# Patient Record
Sex: Female | Born: 1940 | Race: White | Hispanic: No | Smoking: Current every day smoker
Health system: Southern US, Community
[De-identification: ages and names within clinical notes are randomized; demographics above are authoritative.]

## PROBLEM LIST (undated history)

## (undated) DIAGNOSIS — I10 Essential (primary) hypertension: Secondary | ICD-10-CM

## (undated) DIAGNOSIS — I6529 Occlusion and stenosis of unspecified carotid artery: Secondary | ICD-10-CM

## (undated) DIAGNOSIS — I4891 Unspecified atrial fibrillation: Secondary | ICD-10-CM

## (undated) DIAGNOSIS — E785 Hyperlipidemia, unspecified: Secondary | ICD-10-CM

## (undated) DIAGNOSIS — I639 Cerebral infarction, unspecified: Secondary | ICD-10-CM

## (undated) HISTORY — DX: Cerebral infarction, unspecified: I63.9

## (undated) HISTORY — DX: Occlusion and stenosis of unspecified carotid artery: I65.29

## (undated) HISTORY — PX: CATARACT EXTRACTION, BILATERAL: SHX1313

## (undated) HISTORY — DX: Hyperlipidemia, unspecified: E78.5

## (undated) HISTORY — PX: BACK SURGERY: SHX140

## (undated) HISTORY — DX: Unspecified atrial fibrillation: I48.91

---
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
## 1981-07-09 HISTORY — PX: OTHER SURGICAL HISTORY: SHX169

## 2010-09-26 ENCOUNTER — Other Ambulatory Visit: Payer: Self-pay | Admitting: Family Medicine

## 2010-09-26 DIAGNOSIS — E785 Hyperlipidemia, unspecified: Secondary | ICD-10-CM

## 2010-09-26 DIAGNOSIS — R0989 Other specified symptoms and signs involving the circulatory and respiratory systems: Secondary | ICD-10-CM

## 2010-10-02 ENCOUNTER — Ambulatory Visit
Admission: RE | Admit: 2010-10-02 | Discharge: 2010-10-02 | Disposition: A | Discharge status: Home / Self Care | Payer: Medicare Other | Source: Ambulatory Visit | Attending: Family Medicine | Admitting: Family Medicine

## 2010-10-02 DIAGNOSIS — R0989 Other specified symptoms and signs involving the circulatory and respiratory systems: Secondary | ICD-10-CM

## 2010-10-02 DIAGNOSIS — E785 Hyperlipidemia, unspecified: Secondary | ICD-10-CM

## 2010-10-02 IMAGING — US US CAROTID DUPLEX BILAT
1 series · 13 of 24 positions shown · non-contrast
Comparison: None.

CLINICAL DATA: 69-year with carotid bruit and hyperlipidemia.

BILATERAL CAROTID DUPLEX ULTRASOUND
TECHNIQUE: Gray scale imaging, color Doppler and duplex ultrasound
was performed of bilateral carotid and vertebral arteries in the
neck.

[Series 1: us carotid duplex bilat · 0.08mm/px · 13 of 60 slices shown]
[im 1/60]
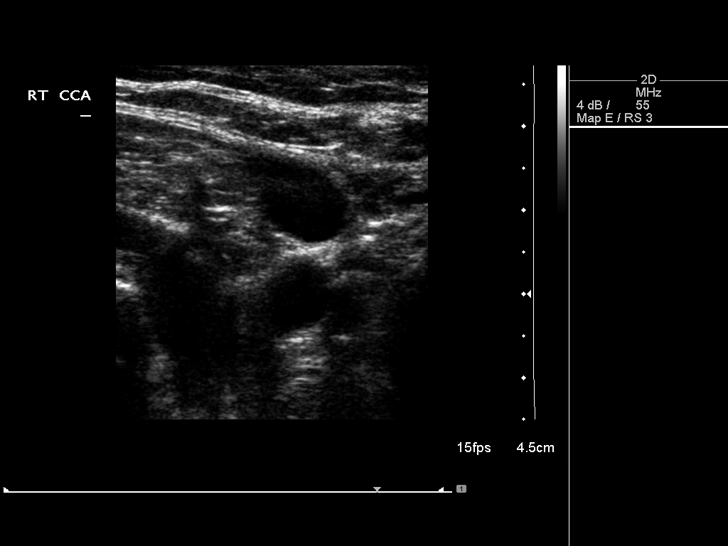
[im 6/60]
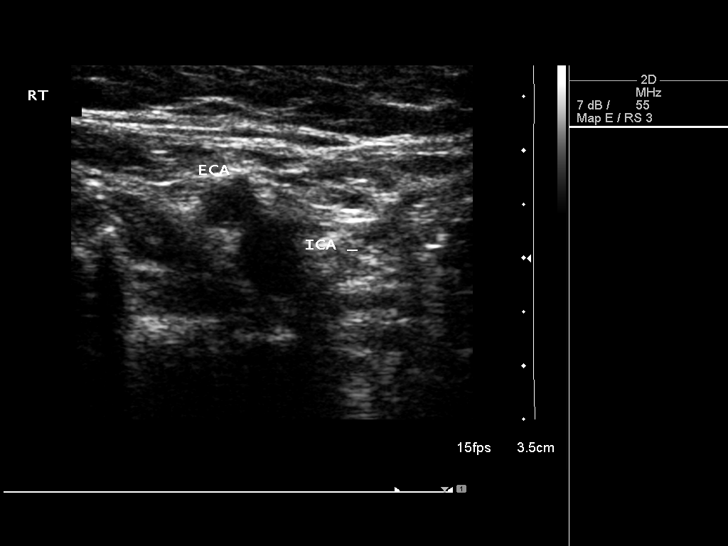
[im 11/60]
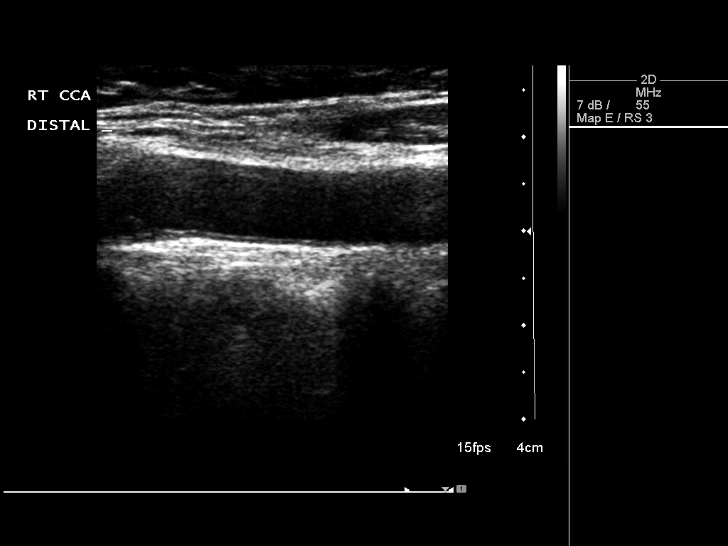
[im 16/60]
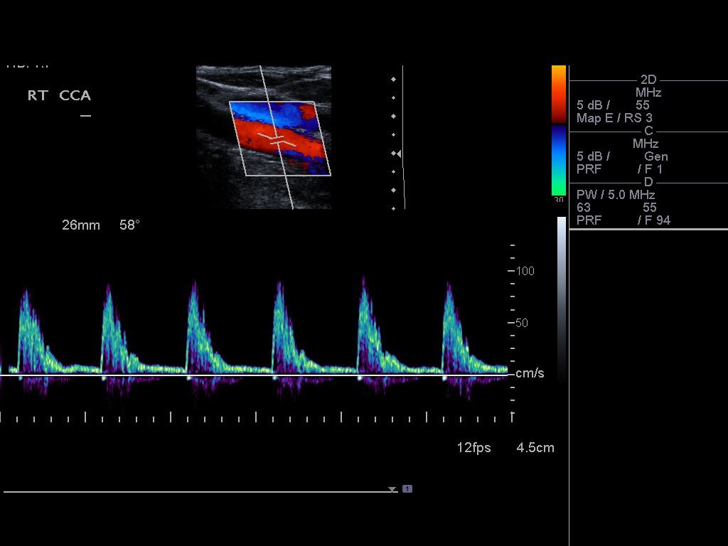
[im 21/60]
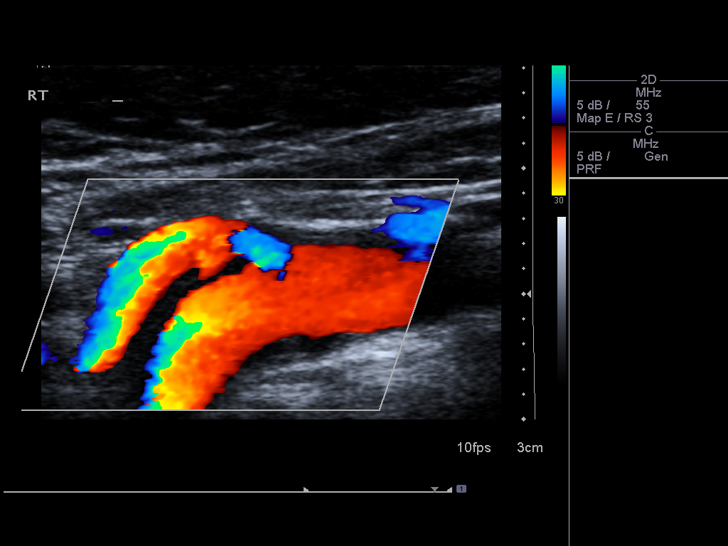
[im 26/60]
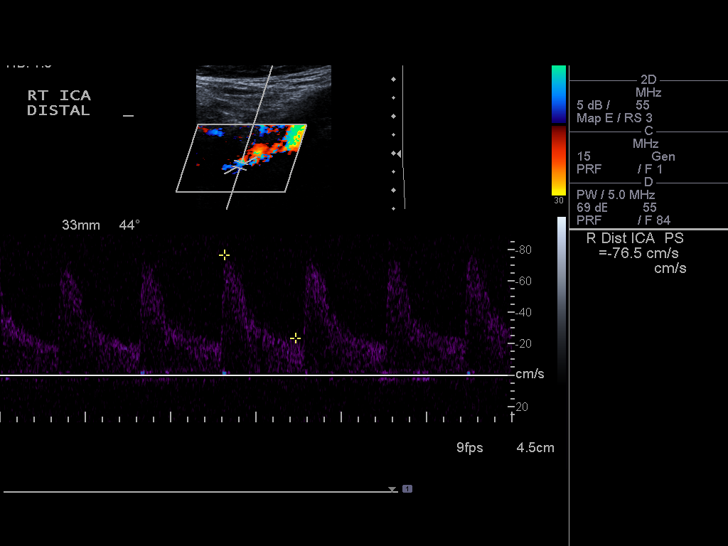
[im 31/60]
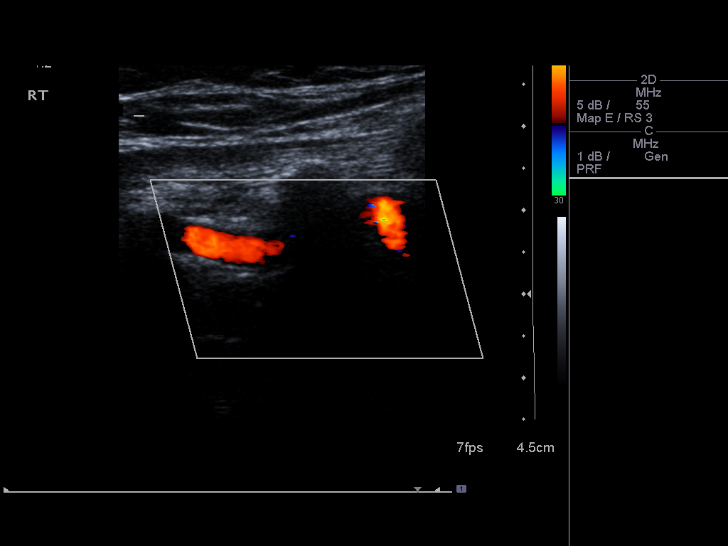
[im 34/60]
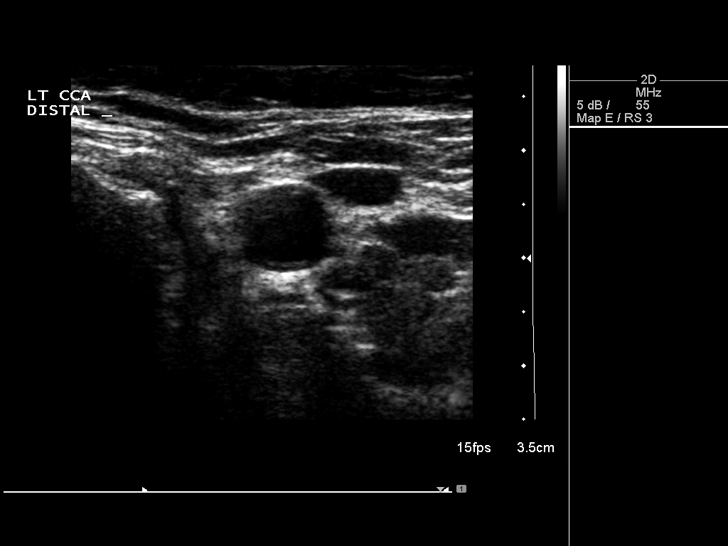
[im 39/60]
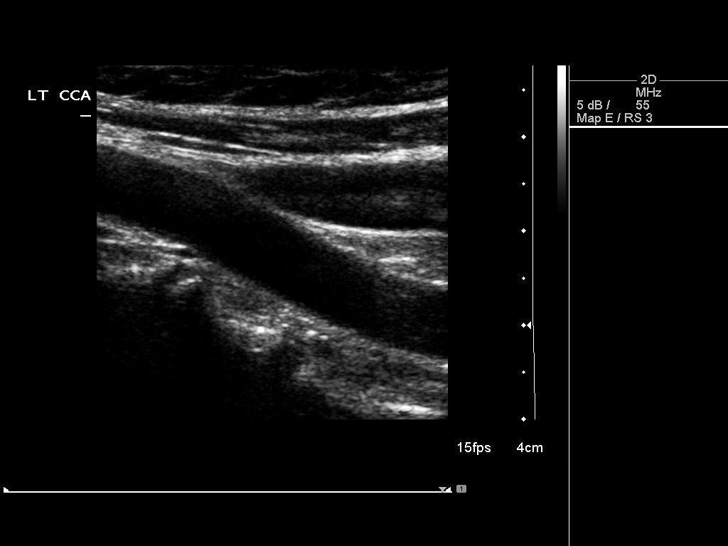
[im 44/60]
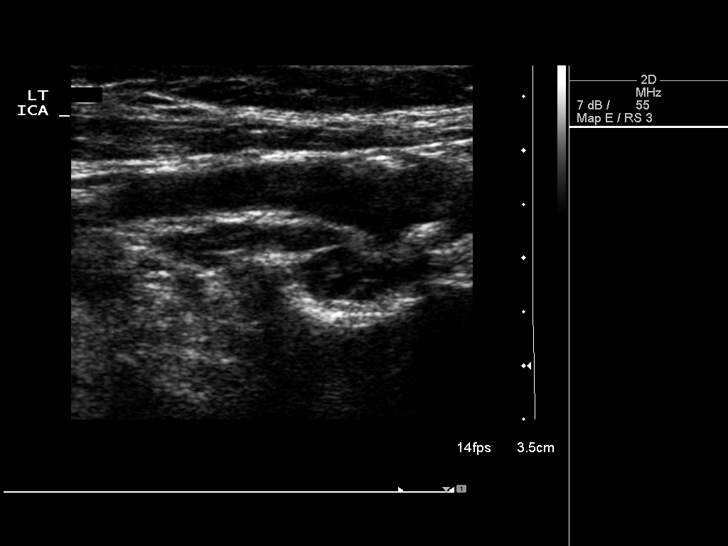
[im 49/60]
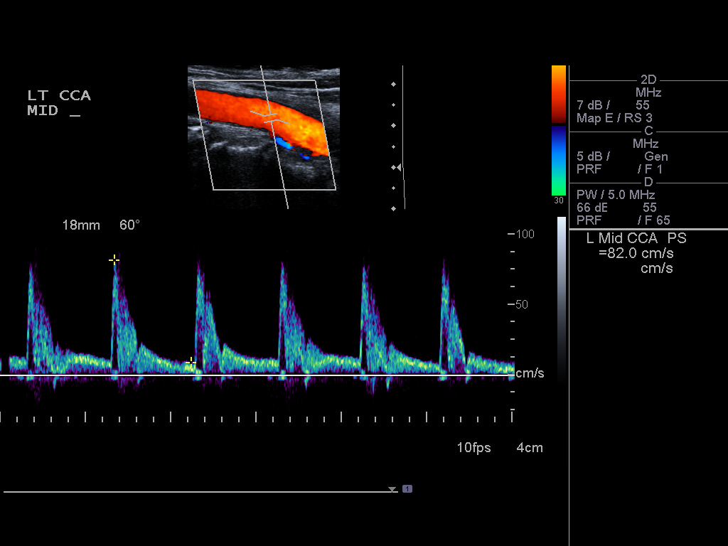
[im 54/60]
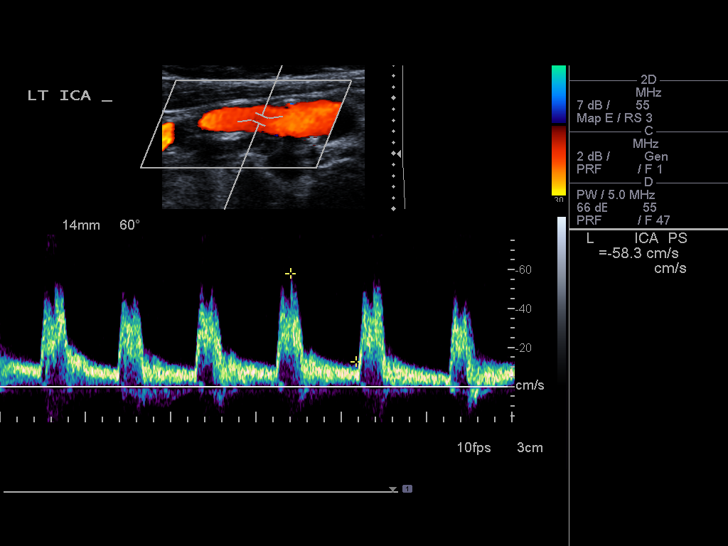
[im 60/60]
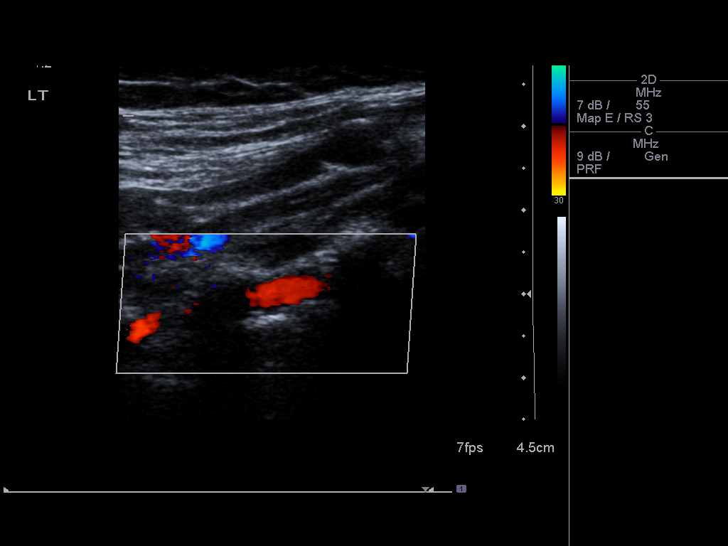

[13 of 24 positions shown; findings below may reference images not displayed]

Criteria:  Quantification of carotid stenosis is based on velocity
parameters that correlate the residual internal carotid diameter
with NASCET-based stenosis levels, using the diameter of the distal
internal carotid lumen as the denominator for stenosis measurement.

The following velocity measurements were obtained:

                 PEAK SYSTOLIC/END DIASTOLIC
RIGHT
ICA:                        77cm/sec
CCA:                        81cm/sec
SYSTOLIC ICA/CCA RATIO:
DIASTOLIC ICA/CCA RATIO:
ECA:                        133cm/sec

LEFT
ICA:                        117cm/sec
CCA:                        99cm/sec
SYSTOLIC ICA/CCA RATIO:
DIASTOLIC ICA/CCA RATIO:
ECA:                        163cm/sec
FINDINGS: RIGHT CAROTID ARTERY: The right carotid arteries are patent without
significant stenosis.  Small amount of plaque in the proximal right
internal carotid artery.  Intimal thickening and plaque in the mid
right common carotid artery.

RIGHT VERTEBRAL ARTERY:  Antegrade flow and normal waveform in the
right vertebral artery.

LEFT CAROTID ARTERY: Calcific plaque at the carotid bulb without
significant stenosis in the internal carotid artery. Antegrade flow
in the left carotid arteries.

LEFT VERTEBRAL ARTERY:  Antegrade flow and normal waveform in the
left vertebral artery.
IMPRESSION: Atherosclerotic disease in the carotid arteries bilaterally.
Estimated degree of stenosis in the internal carotid arteries is
less than 50%.

Antegrade flow in the vertebral arteries bilaterally.

The patient is hypertensive.  Right arm blood pressure is 225/113
mmHG.  Left arm blood pressures is 213/100 mmHg.

## 2015-11-01 ENCOUNTER — Other Ambulatory Visit: Payer: Self-pay | Admitting: Family Medicine

## 2015-11-01 DIAGNOSIS — I10 Essential (primary) hypertension: Secondary | ICD-10-CM

## 2015-11-01 DIAGNOSIS — Z823 Family history of stroke: Secondary | ICD-10-CM

## 2015-11-01 DIAGNOSIS — R42 Dizziness and giddiness: Secondary | ICD-10-CM

## 2015-11-07 ENCOUNTER — Ambulatory Visit
Admission: RE | Admit: 2015-11-07 | Discharge: 2015-11-07 | Disposition: A | Discharge status: Home / Self Care | Payer: Medicare Other | Source: Ambulatory Visit | Attending: Family Medicine | Admitting: Family Medicine

## 2015-11-07 DIAGNOSIS — Z823 Family history of stroke: Secondary | ICD-10-CM

## 2015-11-07 DIAGNOSIS — I10 Essential (primary) hypertension: Secondary | ICD-10-CM

## 2015-11-07 DIAGNOSIS — R42 Dizziness and giddiness: Secondary | ICD-10-CM

## 2015-11-07 IMAGING — US US CAROTID DUPLEX BILAT
1 series · 13 of 24 positions shown · non-contrast
Comparison: Prior duplex carotid ultrasound [DATE]

CLINICAL DATA: 74-year-old female with dizziness and hypertension

EXAM:
BILATERAL CAROTID DUPLEX ULTRASOUND
TECHNIQUE: Gray scale imaging, color Doppler and duplex ultrasound were
performed of bilateral carotid and vertebral arteries in the neck.

[Series 1: us carotid duplex bilat · 0.08mm/px · 13 of 58 slices shown]
[im 1/58]
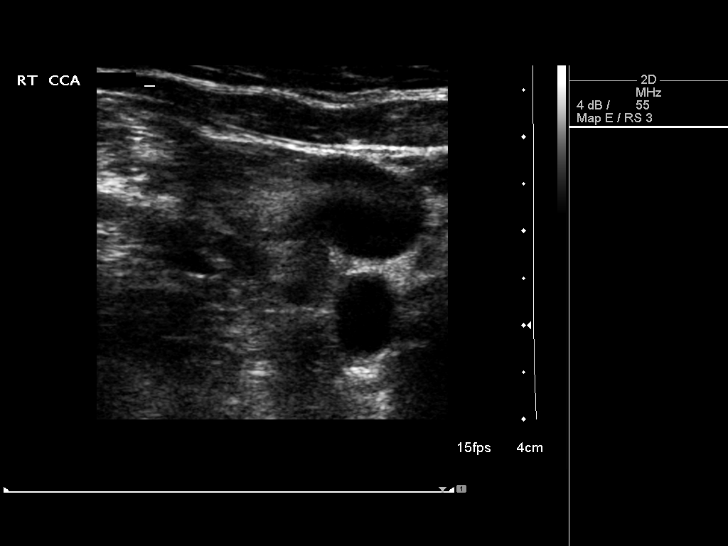
[im 5/58]
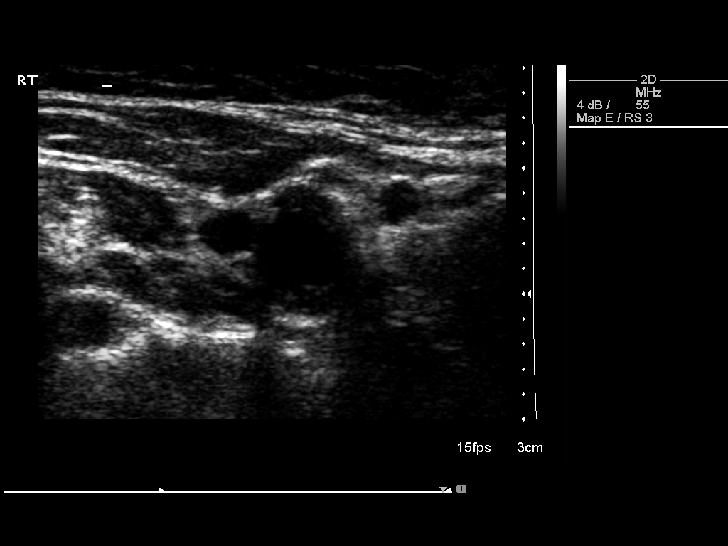
[im 10/58]
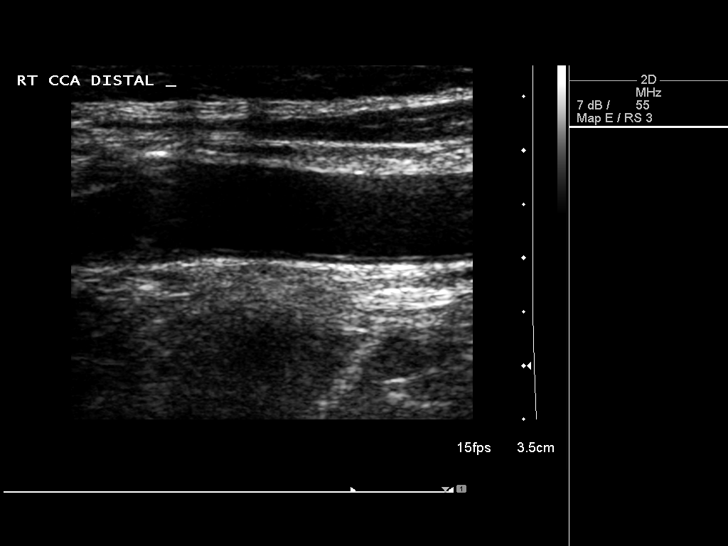
[im 15/58]
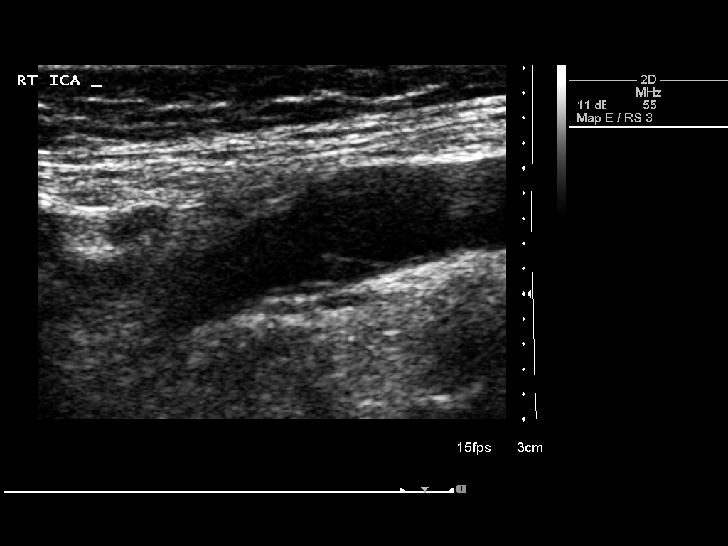
[im 20/58]
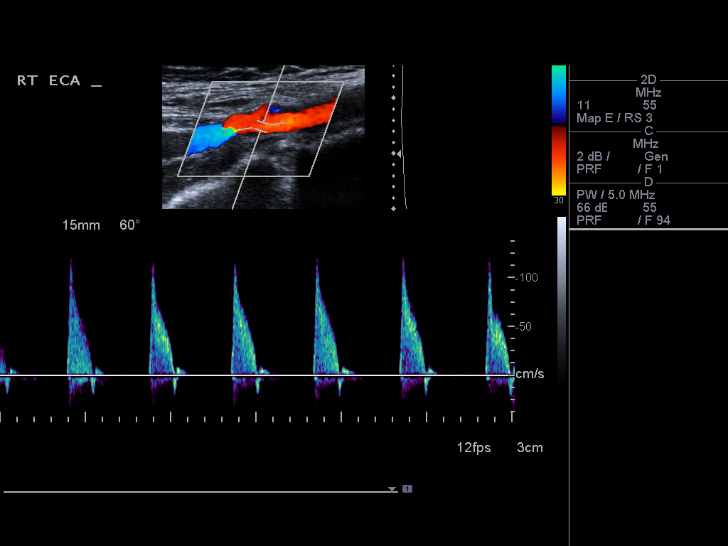
[im 25/58]
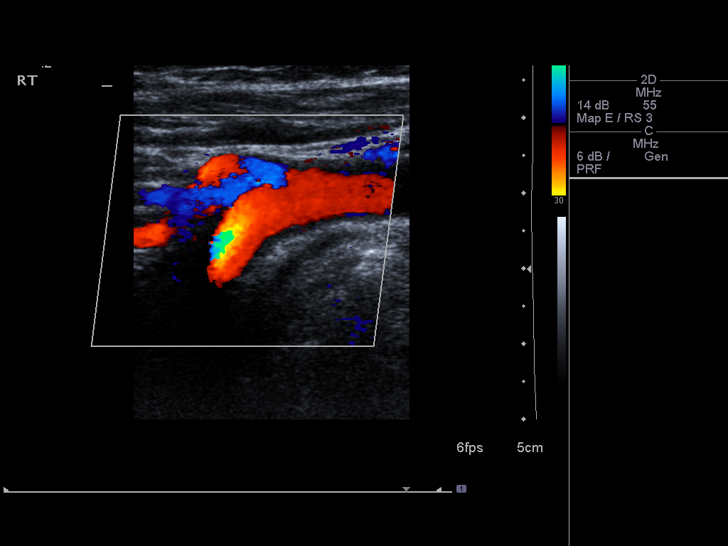
[im 30/58]
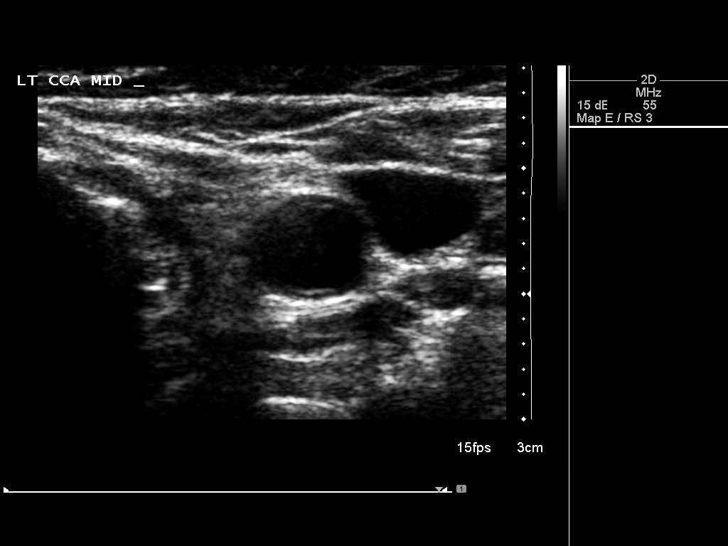
[im 33/58]
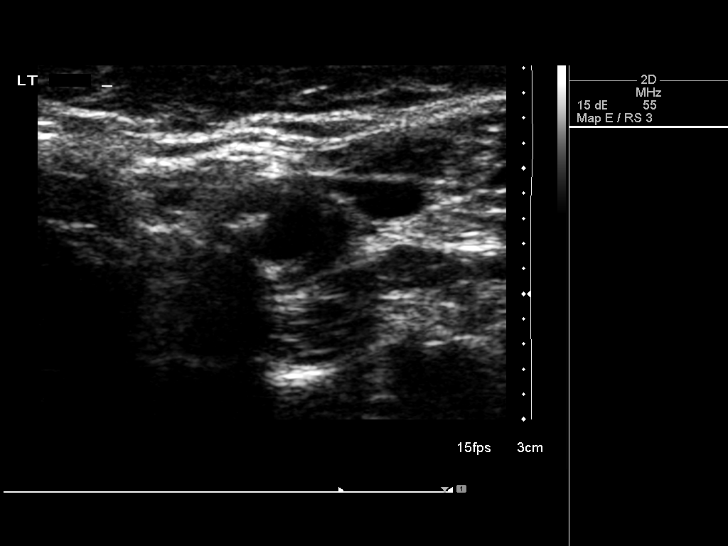
[im 38/58]
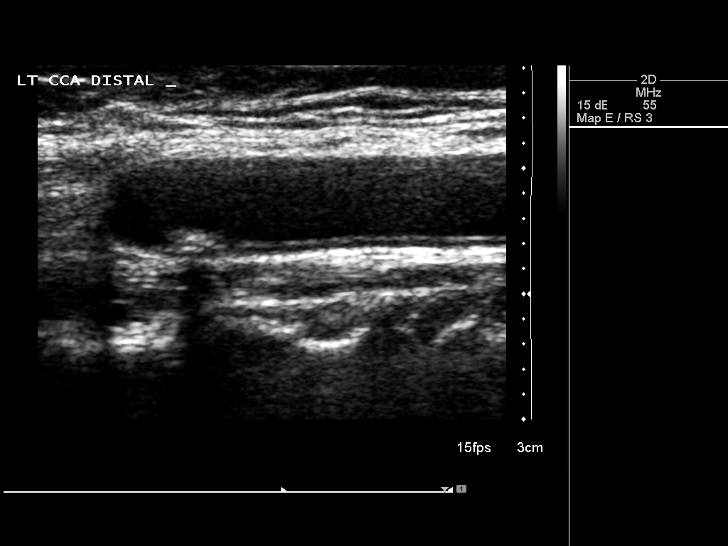
[im 43/58]
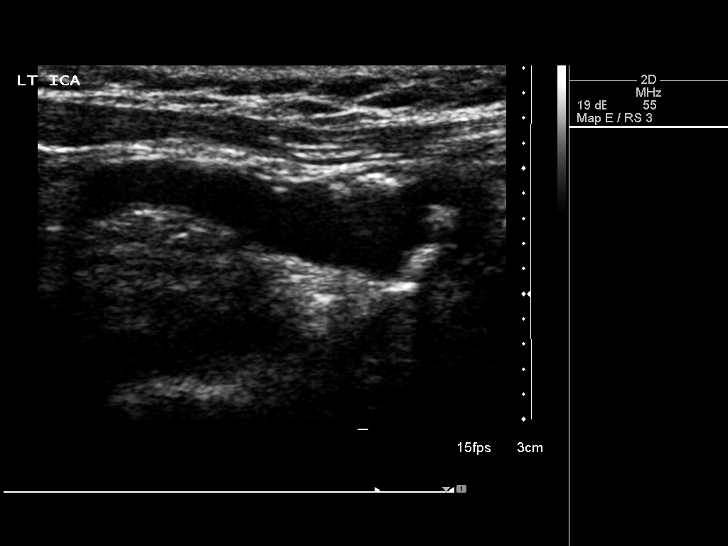
[im 48/58]
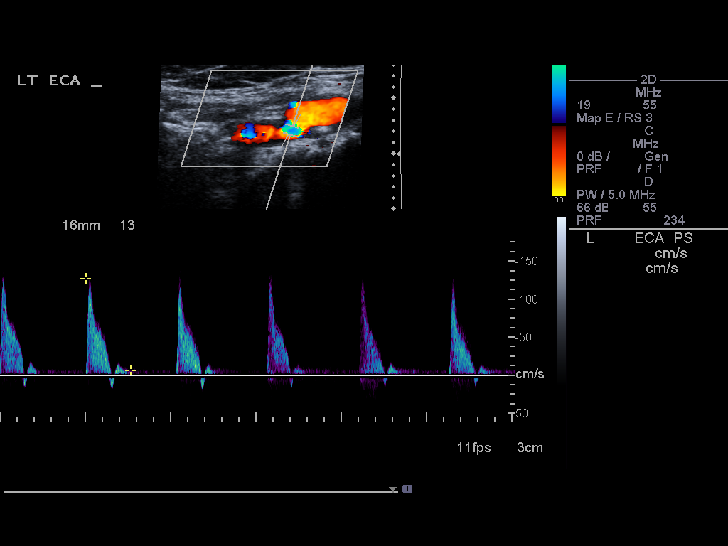
[im 53/58]
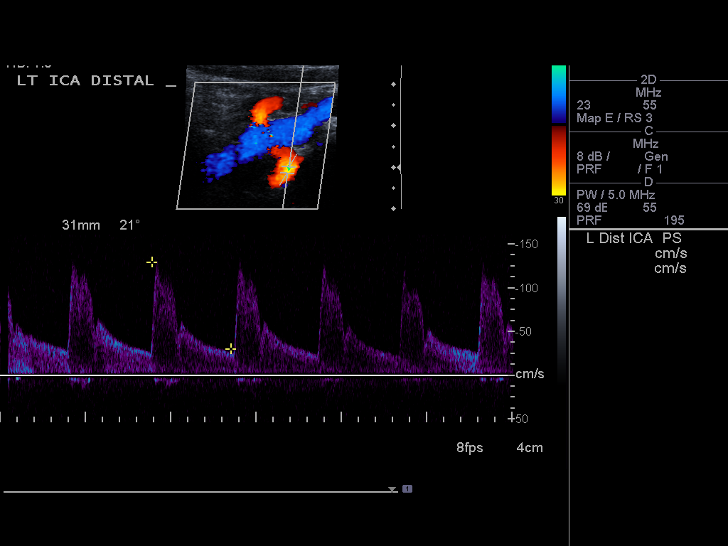
[im 58/58]
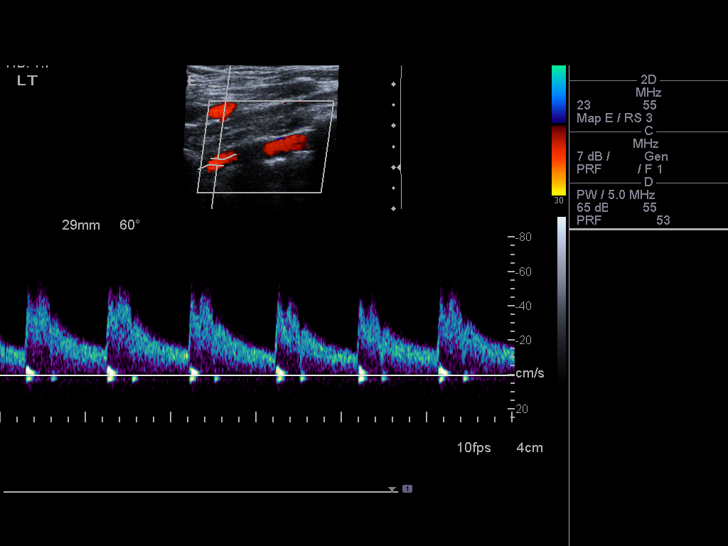

[13 of 24 positions shown; findings below may reference images not displayed]

FINDINGS: Criteria: Quantification of carotid stenosis is based on velocity
parameters that correlate the residual internal carotid diameter
with NASCET-based stenosis levels, using the diameter of the distal
internal carotid lumen as the denominator for stenosis measurement.

The following velocity measurements were obtained:

RIGHT

ICA:  76/15 cm/sec

CCA:  84/10 cm/sec

SYSTOLIC ICA/CCA RATIO:

DIASTOLIC ICA/CCA RATIO:

ECA:  133 cm/sec

LEFT

ICA:  129/30 cm/sec

CCA:  84/7 cm/sec

SYSTOLIC ICA/CCA RATIO:

DIASTOLIC ICA/CCA RATIO:

ECA:  181 cm/sec

RIGHT CAROTID ARTERY: Mild smooth heterogeneous atherosclerotic
plaque in the distal common carotid artery and proximal internal
carotid artery. By peak systolic velocity criteria the estimated
stenosis remains less than 50%.

RIGHT VERTEBRAL ARTERY:  Patent with normal antegrade flow.

LEFT CAROTID ARTERY: Heterogeneous atherosclerotic plaque in the
distal common carotid artery and proximal internal carotid artery.
By peak systolic velocity criteria the estimated stenosis remains
less than 50%. There is elevation of the peak systolic velocity in
the more distal and tortuous internal carotid artery which is likely
spurious.

LEFT VERTEBRAL ARTERY:  Patent with normal antegrade flow.
IMPRESSION: 1. Mild (1-49%) stenosis proximal right internal carotid artery
secondary to heterogenous atherosclerotic plaque.
2. Mild (1-49%) stenosis proximal left internal carotid artery
secondary to heterogenous atherosclerotic plaque.
3. Vertebral arteries remain patent with antegrade flow.

## 2018-08-25 ENCOUNTER — Other Ambulatory Visit: Payer: Self-pay

## 2018-09-03 ENCOUNTER — Other Ambulatory Visit: Payer: Self-pay | Admitting: Cardiology

## 2018-09-03 DIAGNOSIS — I080 Rheumatic disorders of both mitral and aortic valves: Secondary | ICD-10-CM

## 2018-09-10 ENCOUNTER — Other Ambulatory Visit: Payer: Self-pay

## 2018-09-10 DIAGNOSIS — I080 Rheumatic disorders of both mitral and aortic valves: Secondary | ICD-10-CM

## 2018-09-10 MED ORDER — FUROSEMIDE 20 MG PO TABS: 20.0000 mg | ORAL_TABLET | Freq: Every day | ORAL | 3 refills | Status: DC

## 2018-10-20 ENCOUNTER — Telehealth: Payer: Self-pay

## 2018-10-20 NOTE — Telephone Encounter (Signed)
Patient needs to r/s echo scheduled on 10/28/18 called and number was busy

## 2018-10-28 ENCOUNTER — Other Ambulatory Visit: Payer: Self-pay

## 2018-11-04 ENCOUNTER — Ambulatory Visit: Payer: Self-pay | Admitting: Cardiology

## 2019-01-23 ENCOUNTER — Encounter: Payer: Self-pay | Admitting: Cardiology

## 2019-01-23 DIAGNOSIS — I10 Essential (primary) hypertension: Secondary | ICD-10-CM

## 2019-01-23 HISTORY — DX: Essential (primary) hypertension: I10

## 2019-01-24 DIAGNOSIS — I080 Rheumatic disorders of both mitral and aortic valves: Secondary | ICD-10-CM | POA: Insufficient documentation

## 2019-01-24 DIAGNOSIS — F17200 Nicotine dependence, unspecified, uncomplicated: Secondary | ICD-10-CM | POA: Insufficient documentation

## 2019-01-24 NOTE — Progress Notes (Signed)
Subjective:  Primary Physician:  Lorenda IshiharaVaradarajan, Rupashree, MD  Patient ID: Mary Ortiz, female    DOB: 06/12/1941, 78 y.o.   MRN: [id number]  This visit type was conducted due to national recommendations for restrictions regarding the COVID-19 Pandemic (e.g. social distancing).  This format is felt to be most appropriate for this patient at this time.  All issues noted in this document were discussed and addressed.  No physical exam was performed (except for noted visual exam findings with Telehealth visits - very limited).  The patient has consented to conduct a Telehealth visit and understands insurance will be billed.   I connected with patient, on 01/27/19  by a telemedicine application and verified that I am speaking with the correct person using two identifiers.     I discussed the limitations of evaluation and management by telemedicine and the availability of in person appointments. The patient expressed understanding and agreed to proceed.   I have discussed with patient regarding the safety during COVID Pandemic and steps and precautions including social distancing with the patient.    Chief Complaint  Patient presents with  . Mitral Regurgitation    /Aortic  . Hypertension  . Follow-up    5266yr   HPI: Mary BroodJannie L Grewell  is a 78 y.o. female who presents for follow-up for valvular heart disease and Hypertension  Patient has been feeling well. She denies any complaints of chest pain, tightness, heaviness or pressure.There is no shortness of breath, orthopnea or PND. No palpitation, dizziness, near-syncope or syncope. No history of swelling on the legs. No history of leg pain, claudication.  No history of diabetes. Her cholesterol was told to be normal. She is still smoking 1/2 pack per day. She does not walk or do any type of exercises regularly. Patient has family history of premature CAD. No history of DVT or pulmonary embolism.  No history of thyroid problems. No  history of TIA or CVA.  Past Medical History:  Diagnosis Date  . HTN (hypertension) 01/23/2019   Past Surgical History:  Procedure Laterality Date  . BACK SURGERY     1977  . CATARACT EXTRACTION, BILATERAL     2019, 2016  . Hystertectomy  1983    Social History   Socioeconomic History  . Marital status: Widowed    Spouse name: Not on file  . Number of children: 0  . Years of education: Not on file  . Highest education level: Not on file  Occupational History  . Not on file  Social Needs  . Financial resource strain: Not on file  . Food insecurity    Worry: Not on file    Inability: Not on file  . Transportation needs    Medical: Not on file    Non-medical: Not on file  Tobacco Use  . Smoking status: Current Every Day Smoker    Packs/day: 0.25    Types: Cigarettes  . Smokeless tobacco: Never Used  . Tobacco comment: smokes 3 packs a week  Substance and Sexual Activity  . Alcohol use: Never    Frequency: Never  . Drug use: Never  . Sexual activity: Not on file  Lifestyle  . Physical activity    Days per week: Not on file    Minutes per session: Not on file  . Stress: Not on file  Relationships  . Social Musicianconnections    Talks on phone: Not on file    Gets together: Not on file  Attends religious service: Not on file    Active member of club or organization: Not on file    Attends meetings of clubs or organizations: Not on file    Relationship status: Not on file  . Intimate partner violence    Fear of current or ex partner: Not on file    Emotionally abused: Not on file    Physically abused: Not on file    Forced sexual activity: Not on file  Other Topics Concern  . Not on file  Social History Narrative  . Not on file    Current Outpatient Medications on File Prior to Visit  Medication Sig Dispense Refill  . amLODipine (NORVASC) 10 MG tablet Take 10 mg by mouth daily.    . Cholecalciferol (VITAMIN D3) 50 MCG (2000 UT) TABS Take by mouth daily.    .  furosemide (LASIX) 20 MG tablet Take 1 tablet (20 mg total) by mouth daily. 90 tablet 3  . olmesartan (BENICAR) 5 MG tablet Take by mouth daily.     No current facility-administered medications on file prior to visit.     Review of Systems  Constitutional: Negative for fever.  HENT: Negative for nosebleeds.   Eyes: Negative for blurred vision.  Respiratory: Negative for cough and shortness of breath.   Cardiovascular: Negative for chest pain, palpitations and leg swelling.  Gastrointestinal: Negative for abdominal pain, nausea and vomiting.  Genitourinary: Negative for dysuria.  Musculoskeletal: Negative for myalgias.  Skin: Negative for itching and rash.  Neurological: Negative for dizziness, seizures and loss of consciousness.  Psychiatric/Behavioral: The patient is not nervous/anxious.      Objective:  Blood pressure 126/80, height 5\' 2"  (1.575 m), weight 163 lb (73.9 kg). Body mass index is 29.81 kg/m.   Physical Exam: Patient is alert and oriented, appeared very comfortable talking to me during the visit. No further detailed physical examination was possible as it was a telemedicine visit.  CARDIAC STUDIES:  Echocardiogram 12/10/2016: Left ventricle cavity is normal in size. Mild concentric hypertrophy of the left ventricle. Normal global wall motion. Calculated EF 67%. Mild (Grade I) aortic regurgitation. Mild aortic valve leaflet calcification. Mild (Grade I) mitral regurgitation. Mild tricuspid regurgitation. No evidence of pulmonary hypertension. Insignificant pericardial effusion. Compared to the study done on 12/20/2015, tricuspid regurgitation was mild to moderate with mild to moderate pulmonary hypertension. Carotid Doppler [10/2015]: Normal. EKG- 10/29/2017-  Low atrial rhythm, poor progression of R wave, nonspecific T-wave abnormality.  Assessment & Recommendations:   1. Mild mitral and aortic regurgitation  2. Essential hypertension  3. Smoking   Laboratory Exam:  No flowsheet data found. No flowsheet data found. Lipid Panel  No results found for: CHOL, TRIG, HDL, CHOLHDL, VLDL, LDLCALC, LDLDIRECT  Recommendation:  Blood pressure is controlled with therapy. She was advised to continue present medications. Valvular heart disease is stable.  Patient is asymptomatic.  Primary prevention was again discussed. She was again advised very strongly to quit smoking completely. Dangerous effects of smoking on heart and lungs etc. were again explained and different ways to quit were discussed. Patient doesn't appear motivated.  She was also given dietary instructions regarding low-salt, low-cholesterol diet and to lose weight.  Patient was also advised to walk regularly or do other aerobic exercises. I will see her in follow-up after 1 year, but call us earlier if blood pressure isn't controlled or there are any cardiac problems. She will have repeat echocardiogram for follow-up of valvular heart disease.  She will continue  to have all the blood tests at her PCP's office.  Despina Hick, MD, [facility] 01/27/2019, 10:33 AM [facility] [place] Pager: [phone] Office: [phone] If no answer Cell [phone]

## 2019-01-27 ENCOUNTER — Encounter: Payer: Self-pay | Admitting: Cardiology

## 2019-01-27 ENCOUNTER — Other Ambulatory Visit: Payer: Self-pay

## 2019-01-27 ENCOUNTER — Ambulatory Visit (INDEPENDENT_AMBULATORY_CARE_PROVIDER_SITE_OTHER): Payer: Medicare Other | Admitting: Cardiology

## 2019-01-27 VITALS — BP 126/80 | Ht 62.0 in | Wt 163.0 lb

## 2019-01-27 DIAGNOSIS — I080 Rheumatic disorders of both mitral and aortic valves: Secondary | ICD-10-CM

## 2019-01-27 DIAGNOSIS — F17200 Nicotine dependence, unspecified, uncomplicated: Secondary | ICD-10-CM | POA: Diagnosis not present

## 2019-01-27 DIAGNOSIS — I10 Essential (primary) hypertension: Secondary | ICD-10-CM

## 2019-02-16 ENCOUNTER — Other Ambulatory Visit: Payer: Self-pay

## 2019-02-16 MED ORDER — AMLODIPINE BESYLATE 10 MG PO TABS: 10.0000 mg | ORAL_TABLET | Freq: Every day | ORAL | 0 refills | Status: DC

## 2019-02-26 ENCOUNTER — Other Ambulatory Visit: Payer: Self-pay

## 2019-02-26 ENCOUNTER — Ambulatory Visit (INDEPENDENT_AMBULATORY_CARE_PROVIDER_SITE_OTHER): Payer: Medicare Other

## 2019-02-26 DIAGNOSIS — I080 Rheumatic disorders of both mitral and aortic valves: Secondary | ICD-10-CM

## 2019-02-26 DIAGNOSIS — I351 Nonrheumatic aortic (valve) insufficiency: Secondary | ICD-10-CM | POA: Diagnosis not present

## 2019-02-26 DIAGNOSIS — I340 Nonrheumatic mitral (valve) insufficiency: Secondary | ICD-10-CM | POA: Diagnosis not present

## 2019-05-13 ENCOUNTER — Other Ambulatory Visit: Payer: Self-pay | Admitting: Cardiology

## 2019-08-10 ENCOUNTER — Other Ambulatory Visit: Payer: Self-pay | Admitting: Cardiology

## 2019-08-30 ENCOUNTER — Other Ambulatory Visit: Payer: Self-pay | Admitting: Cardiology

## 2019-08-30 DIAGNOSIS — I080 Rheumatic disorders of both mitral and aortic valves: Secondary | ICD-10-CM

## 2019-09-02 ENCOUNTER — Other Ambulatory Visit: Payer: Self-pay

## 2019-09-02 DIAGNOSIS — I080 Rheumatic disorders of both mitral and aortic valves: Secondary | ICD-10-CM

## 2019-09-02 MED ORDER — FUROSEMIDE 20 MG PO TABS: 20.0000 mg | ORAL_TABLET | Freq: Every day | ORAL | 1 refills | Status: DC

## 2019-11-09 ENCOUNTER — Other Ambulatory Visit: Payer: Self-pay | Admitting: Cardiology

## 2020-01-26 ENCOUNTER — Ambulatory Visit: Payer: Medicare Other | Admitting: Cardiology

## 2020-01-27 ENCOUNTER — Ambulatory Visit: Payer: Medicare Other | Admitting: Cardiology

## 2020-01-27 ENCOUNTER — Other Ambulatory Visit: Payer: Self-pay

## 2020-01-27 ENCOUNTER — Encounter: Payer: Self-pay | Admitting: Cardiology

## 2020-01-27 VITALS — BP 130/65 | HR 69 | Ht 62.0 in | Wt 154.0 lb

## 2020-01-27 DIAGNOSIS — I351 Nonrheumatic aortic (valve) insufficiency: Secondary | ICD-10-CM

## 2020-01-27 DIAGNOSIS — I6523 Occlusion and stenosis of bilateral carotid arteries: Secondary | ICD-10-CM

## 2020-01-27 DIAGNOSIS — I10 Essential (primary) hypertension: Secondary | ICD-10-CM

## 2020-01-27 DIAGNOSIS — I340 Nonrheumatic mitral (valve) insufficiency: Secondary | ICD-10-CM

## 2020-01-27 DIAGNOSIS — F17200 Nicotine dependence, unspecified, uncomplicated: Secondary | ICD-10-CM

## 2020-01-27 DIAGNOSIS — Z8249 Family history of ischemic heart disease and other diseases of the circulatory system: Secondary | ICD-10-CM

## 2020-01-27 NOTE — Progress Notes (Signed)
[name] Date of Birth: 28-Jan-1941 MRN: [id number] Primary Care Provider:Varadarajan, Soyla Murphy, MD Former Cardiology Providers: Dr. Florian Buff Primary Cardiologist: Tessa Lerner, DO, [facility] (established care 01/27/2020)  Date: 01/27/20 Last Visit: 01/26/2019  Chief Complaint  Patient presents with  . Mild mitral and aortic regurgitation    HPI  Mary Ortiz is a 79 y.o.  female who presents to the office with a chief complaint of " reevaluation of valvular heart disease." Patient's past medical history and cardiovascular risk factors include: Active smoking, premature coronary disease in the family, valvular heart disease, bilateral carotid artery atherosclerosis, advanced age.  Patient was under the care of Dr. Sherril Croon for the management of valvular heart disease and now is here for 1 year follow-up.  I am seeing her for the first time for the above-mentioned chief complaint and for transition of care.  Since last visit patient states that she has been doing well overall from a cardiovascular standpoint.  She denies any chest pain or shortness of breath at rest or with effort related activities.   Reviewed her past medical history and prior records.  Patient states that she has never had a stress test before.  Patient states that she does does not have her cholesterol checked regularly either.   Family history of premature coronary artery disease: younger sister had her MI at age of 66.   Denies prior history of coronary artery disease, myocardial infarction, congestive heart failure, deep venous thrombosis, pulmonary embolism, stroke, transient ischemic attack.  FUNCTIONAL STATUS: No structured exercise program or daily routine.    ALLERGIES: Allergies  Allergen Reactions  . Neosporin Plus Max St Rash    MEDICATION LIST PRIOR TO VISIT: Current Outpatient Medications on File Prior to Visit  Medication Sig Dispense Refill  . amLODipine (NORVASC) 10 MG tablet TAKE 1  TABLET BY MOUTH ONCE DAILY **PLEASE  SCHEDULE  APPOINTMENT** 90 tablet 0  . Cholecalciferol (VITAMIN D3) 50 MCG (2000 UT) TABS Take by mouth daily.    . furosemide (LASIX) 20 MG tablet Take 1 tablet (20 mg total) by mouth daily. 90 tablet 1  . meclizine (ANTIVERT) 12.5 MG tablet Take 12.5 mg by mouth every 8 (eight) hours as needed.    Marland Kitchen olmesartan (BENICAR) 5 MG tablet Take by mouth daily.     No current facility-administered medications on file prior to visit.    PAST MEDICAL HISTORY: Past Medical History:  Diagnosis Date  . HTN (hypertension) 01/23/2019    PAST SURGICAL HISTORY: Past Surgical History:  Procedure Laterality Date  . BACK SURGERY     1977  . CATARACT EXTRACTION, BILATERAL     2019, 2016  . Hystertectomy  1983    FAMILY HISTORY: The patient's family history includes Heart attack in her brother, father, and mother; Kidney disease in her brother; Liver cancer in her brother; Suicidality in her sister.   SOCIAL HISTORY:  The patient  reports that she has been smoking cigarettes. She has been smoking about 0.25 packs per day. She has never used smokeless tobacco. She reports that she does not drink alcohol and does not use drugs.  Review of Systems  Constitutional: Negative for chills and fever.  HENT: Negative for hoarse voice and nosebleeds.   Eyes: Negative for discharge, double vision and pain.  Cardiovascular: Negative for chest pain, claudication, dyspnea on exertion, leg swelling, near-syncope, orthopnea, palpitations, paroxysmal nocturnal dyspnea and syncope.  Respiratory: Negative for hemoptysis and shortness of breath.   Musculoskeletal: Negative  for muscle cramps and myalgias.  Gastrointestinal: Negative for abdominal pain, constipation, diarrhea, hematemesis, hematochezia, melena, nausea and vomiting.  Neurological: Negative for dizziness and light-headedness.    PHYSICAL EXAM: Vitals with BMI 01/27/2020 01/27/2020 01/27/2019  Height - 5\' 2"  5\' 2"     Weight - 154 lbs 163 lbs  BMI - 28.16 29.81  Systolic 130 140  Diastolic 65 68 80  Pulse 69 69 -    CONSTITUTIONAL: Well-developed and well-nourished. No acute distress.  SKIN: Skin is warm and dry. No rash noted. No cyanosis. No pallor. No jaundice HEAD: Normocephalic and atraumatic.  EYES: No scleral icterus MOUTH/THROAT: Moist oral membranes.  NECK: No JVD present. No thyromegaly noted.  Left carotid bruit. LYMPHATIC: No visible cervical adenopathy.  CHEST Normal respiratory effort. No intercostal retractions  LUNGS: Clear to auscultation bilaterally.  No stridor. No wheezes. No rales.  CARDIOVASCULAR: Regular, positive S1-S2, holosystolic murmur heard at the apex, 3 on 6 diastolic murmur heard at the left sternal border, no gallops or rubs. ABDOMINAL: No apparent ascites.  EXTREMITIES: No peripheral edema  HEMATOLOGIC: No significant bruising NEUROLOGIC: Oriented to person, place, and time. Nonfocal. Normal muscle tone.  PSYCHIATRIC: Normal mood and affect. Normal behavior. Cooperative  CARDIAC DATABASE: EKG: 01/27/2020: Normal sinus rhythm, 67 bpm, poor R wave progression, possible old anterior infarct, nonspecific T wave abnormality, without underlying injury pattern.  Echocardiogram: 02/26/2019: LVEF 55%, mild LVH, grade 1 diastolic impairment, mildly dilated left atrium, mild to moderate AR, moderate MR, mild to moderate TR, RVSP 30 mmHg.  Stress Testing:  None  Heart Catheterization: None  Carotid duplex: 11/2015:  Mild (1-49%) stenosis proximal right internal carotid artery secondary to heterogenous atherosclerotic plaque. Mild (1-49%) stenosis proximal left internal carotid artery secondary to heterogenous atherosclerotic plaque. Vertebral arteries remain patent with antegrade flow.  LABORATORY DATA: No recent labs for review at today's office visit.  IMPRESSION:    ICD-10-CM   1. Moderate mitral regurgitation  I34.0 EKG 12-Lead    PCV ECHOCARDIOGRAM  COMPLETE  2. Mild to moderate aortic regurgitation  I35.1   3. Atherosclerosis of both carotid arteries  I65.23 PCV CAROTID DUPLEX (BILATERAL)    Lipid Panel With LDL/HDL Ratio  4. Family history of premature CAD  Z82.49   5. Essential hypertension  I10   6. Smoking  F17.200      RECOMMENDATIONS: LAURENCIA ROMA is a 79 y.o. female whose past medical history and cardiovascular risk factors include: Active smoking, premature coronary disease in the family, valvular heart disease, bilateral carotid artery atherosclerosis, advanced age.  Valvular heart disease, stage B:  Patient has had an echocardiogram back in 2020 and noted to have valvular heart disease involving the mitral and aortic valve.  No prior echocardiograms for comparison purposes.  No prior history of rheumatic fever.  Recommend repeat an echocardiogram to evaluate for structural heart disease and progression of valvular heart disease.  Bilateral carotid artery atherosclerosis:  Patient is currently not on aspirin or statin therapy.  She does have a carotid bruit on the right.  Recommend carotid duplex to reevaluate the degree of carotid artery atherosclerosis.  Check fasting lipid profile  Family history of premature coronary artery disease: Patient does not want to have stress test at this time that she is asymptomatic clinically.  We will continue to follow.  Benign essential hypertension: Currently managed per primary team  Active smoking: Educated on the importance of complete smoking cessation.  It appears that patient is not very motivated to  stop smoking.  But we did discuss other resources available to her to help her quit smoking.  She will discuss it further with PCP.  I would like to see her back in 6 weeks or sooner to review the test results and change medical therapy clinically warranted.  FINAL MEDICATION LIST END OF ENCOUNTER: No orders of the defined types were placed in this encounter.     There are no discontinued medications.   Current Outpatient Medications:  .  amLODipine (NORVASC) 10 MG tablet, TAKE 1 TABLET BY MOUTH ONCE DAILY **PLEASE  SCHEDULE  APPOINTMENT**, Disp: 90 tablet, Rfl: 0 .  Cholecalciferol (VITAMIN D3) 50 MCG (2000 UT) TABS, Take by mouth daily., Disp: , Rfl:  .  furosemide (LASIX) 20 MG tablet, Take 1 tablet (20 mg total) by mouth daily., Disp: 90 tablet, Rfl: 1 .  meclizine (ANTIVERT) 12.5 MG tablet, Take 12.5 mg by mouth every 8 (eight) hours as needed., Disp: , Rfl:  .  olmesartan (BENICAR) 5 MG tablet, Take by mouth daily., Disp: , Rfl:   Orders Placed This Encounter  Procedures  . Lipid Panel With LDL/HDL Ratio  . EKG 12-Lead  . PCV ECHOCARDIOGRAM COMPLETE  . PCV CAROTID DUPLEX (BILATERAL)   --Continue cardiac medications as reconciled in final medication list. --Return in about 6 weeks (around 03/09/2020) for review test results.. Or sooner if needed. --Continue follow-up with your primary care physician regarding the management of your other chronic comorbid conditions.  Patient's questions and concerns were addressed to her satisfaction. She voices understanding of the instructions provided during this encounter.   Total time spent: 42 minutes independently reviewing prior records including test results and office notes, updating past history, formulating a plan, and coordinating care.  This note was created using a voice recognition software as a result there may be grammatical errors inadvertently enclosed that do not reflect the nature of this encounter. Every attempt is made to correct such errors.  Tessa Lerner, [place], [facility]  Pager: [phone] Office: [phone]

## 2020-01-28 ENCOUNTER — Other Ambulatory Visit: Payer: Self-pay

## 2020-01-28 DIAGNOSIS — I6523 Occlusion and stenosis of bilateral carotid arteries: Secondary | ICD-10-CM

## 2020-02-05 ENCOUNTER — Other Ambulatory Visit: Payer: Self-pay | Admitting: Cardiology

## 2020-02-09 ENCOUNTER — Ambulatory Visit: Payer: Medicare Other

## 2020-02-09 ENCOUNTER — Other Ambulatory Visit: Payer: Self-pay

## 2020-02-09 DIAGNOSIS — I340 Nonrheumatic mitral (valve) insufficiency: Secondary | ICD-10-CM

## 2020-02-09 DIAGNOSIS — I6523 Occlusion and stenosis of bilateral carotid arteries: Secondary | ICD-10-CM

## 2020-02-14 ENCOUNTER — Other Ambulatory Visit: Payer: Self-pay | Admitting: Cardiology

## 2020-02-14 DIAGNOSIS — I6523 Occlusion and stenosis of bilateral carotid arteries: Secondary | ICD-10-CM

## 2020-02-22 NOTE — Progress Notes (Signed)
Spoke with patient. Patient voiced understanding.

## 2020-02-27 ENCOUNTER — Other Ambulatory Visit: Payer: Self-pay | Admitting: Cardiology

## 2020-02-27 DIAGNOSIS — I080 Rheumatic disorders of both mitral and aortic valves: Secondary | ICD-10-CM

## 2020-03-03 LAB — LIPID PANEL WITH LDL/HDL RATIO
Cholesterol, Total: 165 mg/dL (ref 100–199)
HDL: 52 mg/dL (ref >39)
LDL Chol Calc (NIH): 89 mg/dL (ref 0–99)
LDL/HDL Ratio: 1.7 ratio (ref 0.0–3.2)
Triglycerides: 136 mg/dL (ref 0–149)
VLDL Cholesterol Cal: 24 mg/dL (ref 5–40)

## 2020-03-09 ENCOUNTER — Encounter: Payer: Self-pay | Admitting: Cardiology

## 2020-03-09 ENCOUNTER — Ambulatory Visit: Payer: Medicare Other | Admitting: Cardiology

## 2020-03-09 ENCOUNTER — Other Ambulatory Visit: Payer: Self-pay

## 2020-03-09 VITALS — BP 138/68 | HR 68 | Resp 16 | Ht 62.0 in | Wt 151.4 lb

## 2020-03-09 DIAGNOSIS — F17200 Nicotine dependence, unspecified, uncomplicated: Secondary | ICD-10-CM

## 2020-03-09 DIAGNOSIS — Z8249 Family history of ischemic heart disease and other diseases of the circulatory system: Secondary | ICD-10-CM

## 2020-03-09 DIAGNOSIS — I6523 Occlusion and stenosis of bilateral carotid arteries: Secondary | ICD-10-CM

## 2020-03-09 DIAGNOSIS — I340 Nonrheumatic mitral (valve) insufficiency: Secondary | ICD-10-CM

## 2020-03-09 DIAGNOSIS — I351 Nonrheumatic aortic (valve) insufficiency: Secondary | ICD-10-CM

## 2020-03-09 DIAGNOSIS — I10 Essential (primary) hypertension: Secondary | ICD-10-CM

## 2020-03-09 MED ORDER — ROSUVASTATIN CALCIUM 20 MG PO TABS: 20.0000 mg | ORAL_TABLET | Freq: Every evening | ORAL | 1 refills | Status: DC

## 2020-03-09 MED ORDER — ASPIRIN EC 81 MG PO TBEC: 81.0000 mg | DELAYED_RELEASE_TABLET | Freq: Every day | ORAL | 3 refills | Status: DC

## 2020-03-09 NOTE — Progress Notes (Signed)
Mary Ortiz Date of Birth: 1940-09-12 MRN: [id number] Primary Care Provider:Varadarajan, Soyla Murphy, MD Former Cardiology Providers: Dr. Florian Buff Primary Cardiologist: Tessa Lerner, DO, [facility] (established care 01/27/2020)  Date: 03/09/20 Last Office Visit: 01/27/2020   Chief Complaint  Patient presents with  . Follow-up  . Results     DUPLEX and echo    HPI  Mary Ortiz is a 79 y.o.  female who presents to the office with a chief complaint of " reevaluation of valvular heart disease." Patient's past medical history and cardiovascular risk factors include: Active smoking, premature coronary disease in the family, valvular heart disease, bilateral carotid artery atherosclerosis, advanced age.  Patient was under the care of Dr. Sherril Croon for the management of valvular heart disease and establish care with myself back in July 2021.  Since last office visit patient states that she is doing well from a cardiovascular standpoint.  At the last visit she was recommended to undergo echocardiogram to reevaluate valvular heart disease.  LVEF is preserved she continues to have mild to moderate valvular heart disease as detailed below.  Results explained to her in great detail at today's visit.  Patient is noted to have bilateral carotid artery atherosclerosis and the last study was performed in 2017.  Repeated follow-up study since last visit to reevaluate underlying disease burden.  Patient is noted to have bilateral carotid artery stenosis of less than 70%.  Most recent lipid profile reviewed independently with the patient at today's visit.  Patient denies any symptoms of stroke no prior history of TIA or CVA.  Family history of premature coronary artery disease: younger sister had her MI at age of 32.   FUNCTIONAL STATUS: No structured exercise program or daily routine.    ALLERGIES: Allergies  Allergen Reactions  . Neosporin Plus Max St Rash    MEDICATION LIST PRIOR TO  VISIT: Current Outpatient Medications on File Prior to Visit  Medication Sig Dispense Refill  . amLODipine (NORVASC) 10 MG tablet TAKE 1 TABLET BY MOUTH ONCE DAILY. PLEASE SCHEDULE APPOINTMENT 90 tablet 0  . Cholecalciferol (VITAMIN D3) 50 MCG (2000 UT) TABS Take by mouth daily.    . furosemide (LASIX) 20 MG tablet Take 1 tablet by mouth once daily 90 tablet 0  . olmesartan (BENICAR) 5 MG tablet Take by mouth daily.    . meclizine (ANTIVERT) 12.5 MG tablet Take 12.5 mg by mouth every 8 (eight) hours as needed. (Patient not taking: Reported on 03/09/2020)     No current facility-administered medications on file prior to visit.    PAST MEDICAL HISTORY: Past Medical History:  Diagnosis Date  . Carotid artery stenosis   . HTN (hypertension) 01/23/2019  . Hyperlipidemia     PAST SURGICAL HISTORY: Past Surgical History:  Procedure Laterality Date  . BACK SURGERY     1977  . CATARACT EXTRACTION, BILATERAL     2019, 2016  . Hystertectomy  1983    FAMILY HISTORY: The patient's family history includes Heart attack in her brother, father, and mother; Kidney disease in her brother; Liver cancer in her brother; Suicidality in her sister.   SOCIAL HISTORY:  The patient  reports that she has been smoking cigarettes. She has been smoking about 0.25 packs per day. She has never used smokeless tobacco. She reports that she does not drink alcohol and does not use drugs.  Review of Systems  Constitutional: Negative for chills and fever.  HENT: Negative for hoarse voice and nosebleeds.   Eyes:  Negative for discharge, double vision and pain.  Cardiovascular: Negative for chest pain, claudication, dyspnea on exertion, leg swelling, near-syncope, orthopnea, palpitations, paroxysmal nocturnal dyspnea and syncope.  Respiratory: Negative for hemoptysis and shortness of breath.   Musculoskeletal: Negative for muscle cramps and myalgias.  Gastrointestinal: Negative for abdominal pain, constipation,  diarrhea, hematemesis, hematochezia, melena, nausea and vomiting.  Neurological: Negative for dizziness and light-headedness.    PHYSICAL EXAM: Vitals with BMI 03/09/2020 01/27/2020 01/27/2020  Height 5\' 2"  - 5\' 2"   Weight 151 lbs 6 oz - 154 lbs  BMI 27.68 - 28.16  Systolic 138 130  Diastolic 68 65 68  Pulse 68 69 69    CONSTITUTIONAL: Well-developed and well-nourished. No acute distress.  SKIN: Skin is warm and dry. No rash noted. No cyanosis. No pallor. No jaundice HEAD: Normocephalic and atraumatic.  EYES: No scleral icterus MOUTH/THROAT: Moist oral membranes.  NECK: No JVD present. No thyromegaly noted.  Left carotid bruit. LYMPHATIC: No visible cervical adenopathy.  CHEST Normal respiratory effort. No intercostal retractions  LUNGS: Clear to auscultation bilaterally.  No stridor. No wheezes. No rales.  CARDIOVASCULAR: Regular, positive S1-S2, holosystolic murmur heard at the apex, 3 on 6 diastolic murmur heard at the left sternal border, no gallops or rubs. ABDOMINAL: No apparent ascites.  EXTREMITIES: No peripheral edema  HEMATOLOGIC: No significant bruising NEUROLOGIC: Oriented to person, place, and time. Nonfocal. Normal muscle tone.  PSYCHIATRIC: Normal mood and affect. Normal behavior. Cooperative  CARDIAC DATABASE: EKG: 01/27/2020: Normal sinus rhythm, 67 bpm, poor R wave progression, possible old anterior infarct, nonspecific T wave abnormality, without underlying injury pattern.  Echocardiogram: 02/26/2019: LVEF 55%, mild LVH, grade 1 diastolic impairment, mildly dilated left atrium, mild to moderate AR, moderate MR, mild to moderate TR, RVSP 30 mmHg.  02/09/2020:  Left ventricle cavity is normal in size. Moderate concentric hypertrophy of the left ventricle. Normal global wall motion. Normal LV systolic function with visual EF 50-55%. Doppler evidence of grade II (pseudonormal) diastolic dysfunction, elevated LAP.  Left atrial cavity is mildly dilated.  Trileaflet  aortic valve. Moderate (Grade II) aortic regurgitation.  Mild to moderate mitral regurgitation.  Mild tricuspid regurgitation. Estimated pulmonary artery systolic pressure 34 mmHg.  No significant change compared tp previous study in 02/2019.   Stress Testing:  None  Heart Catheterization: None  Carotid duplex: 11/2015:  Mild (1-49%) stenosis proximal right internal carotid artery secondary to heterogenous atherosclerotic plaque. Mild (1-49%) stenosis proximal left internal carotid artery secondary to heterogenous atherosclerotic plaque.  02/09/2020:  Stenosis in the right internal carotid artery (50-69%). Stenosis in the right external carotid artery (<50%).  Stenosis in the left internal carotid artery (50-69%). Stenosis in the left external carotid artery (<50%).  Antegrade right vertebral artery flow. Antegrade left vertebral artery flow.  Follow up in six months is appropriate if clinically indicated.   LABORATORY DATA: Lipid Panel     Component Value Date/Time   CHOL 165 03/02/2020 0837   TRIG 136 03/02/2020 0837   HDL 52 03/02/2020 0837   LDLCALC 89 03/02/2020 0837   LABVLDL 24 03/02/2020 0837   IMPRESSION:    ICD-10-CM   1. Atherosclerosis of both carotid arteries  I65.23 Lipid Panel With LDL/HDL Ratio    aspirin EC 81 MG tablet    rosuvastatin (CRESTOR) 20 MG tablet  2. Moderate mitral regurgitation  I34.0   3. Mild to moderate aortic regurgitation  I35.1   4. Family history of premature CAD  Z82.49   5. Essential hypertension  I10   6. Smoking  F17.200      RECOMMENDATIONS: SHANN LEWELLYN is a 79 y.o. female whose past medical history and cardiovascular risk factors include: Active smoking, premature coronary disease in the family, valvular heart disease, bilateral carotid artery atherosclerosis, advanced age.  Bilateral carotid artery atherosclerosis:  Given the findings of the carotid duplex patient is willing to start aspirin and statin  therapy.  Aspirin 81 mg p.o. daily.  Rosuvastatin 20 mg p.o. nightly.  Medication profile discussed with the patient.  Most recent lipid profile reviewed.  We will check a fasting lipid profile prior to next office visit in February 2022.    Reemphasized the importance of complete smoking cessation given the progressiveness of her carotid atherosclerosis.  Family history of premature coronary artery disease: Given the progressiveness of her coronary atherosclerosis and family history of premature CAD recommended stress test for further risk stratification.  Patient states that she does not want any additional ischemic evaluation at this time.    Benign essential hypertension: Currently managed per primary team  Active smoking: Educated on the importance of complete smoking cessation.  It appears that patient is not very motivated to stop smoking.  But we did discuss other resources available to her to help her quit smoking.  She will discuss it further with PCP.   FINAL MEDICATION LIST END OF ENCOUNTER: Meds ordered this encounter  Medications  . aspirin EC 81 MG tablet    Sig: Take 1 tablet (81 mg total) by mouth daily. Swallow whole.    Dispense:  90 tablet    Refill:  3  . rosuvastatin (CRESTOR) 20 MG tablet    Sig: Take 1 tablet (20 mg total) by mouth at bedtime.    Dispense:  90 tablet    Refill:  1    There are no discontinued medications.   Current Outpatient Medications:  .  amLODipine (NORVASC) 10 MG tablet, TAKE 1 TABLET BY MOUTH ONCE DAILY. PLEASE SCHEDULE APPOINTMENT, Disp: 90 tablet, Rfl: 0 .  Cholecalciferol (VITAMIN D3) 50 MCG (2000 UT) TABS, Take by mouth daily., Disp: , Rfl:  .  furosemide (LASIX) 20 MG tablet, Take 1 tablet by mouth once daily, Disp: 90 tablet, Rfl: 0 .  olmesartan (BENICAR) 5 MG tablet, Take by mouth daily., Disp: , Rfl:  .  aspirin EC 81 MG tablet, Take 1 tablet (81 mg total) by mouth daily. Swallow whole., Disp: 90 tablet, Rfl: 3 .   meclizine (ANTIVERT) 12.5 MG tablet, Take 12.5 mg by mouth every 8 (eight) hours as needed. (Patient not taking: Reported on 03/09/2020), Disp: , Rfl:  .  rosuvastatin (CRESTOR) 20 MG tablet, Take 1 tablet (20 mg total) by mouth at bedtime., Disp: 90 tablet, Rfl: 1  Orders Placed This Encounter  Procedures  . Lipid Panel With LDL/HDL Ratio   --Continue cardiac medications as reconciled in final medication list. --Return in about 6 months (around 09/06/2020) for carotid disease Follow up, review test results.. Or sooner if needed. --Continue follow-up with your primary care physician regarding the management of your other chronic comorbid conditions.  Patient's questions and concerns were addressed to her satisfaction. She voices understanding of the instructions provided during this encounter.   This note was created using a voice recognition software as a result there may be grammatical errors inadvertently enclosed that do not reflect the nature of this encounter. Every attempt is made to correct such errors.  Tessa Lerner, [place], [facility]  Pager: [phone] Office:  [phone]  

## 2020-03-09 NOTE — Patient Instructions (Signed)
Fasting lipid profile on or after 08/23/2020.  Carotid duplex in Feb 2022.  Office visit in March 2022

## 2020-05-27 ENCOUNTER — Other Ambulatory Visit: Payer: Self-pay | Admitting: Cardiology

## 2020-05-27 DIAGNOSIS — I080 Rheumatic disorders of both mitral and aortic valves: Secondary | ICD-10-CM

## 2020-08-23 ENCOUNTER — Other Ambulatory Visit: Payer: Self-pay | Admitting: Cardiology

## 2020-08-23 DIAGNOSIS — I080 Rheumatic disorders of both mitral and aortic valves: Secondary | ICD-10-CM

## 2020-09-06 ENCOUNTER — Ambulatory Visit: Payer: Medicare Other | Admitting: Cardiology

## 2020-10-03 DIAGNOSIS — N1832 Chronic kidney disease, stage 3b: Secondary | ICD-10-CM | POA: Diagnosis not present

## 2020-10-03 DIAGNOSIS — E559 Vitamin D deficiency, unspecified: Secondary | ICD-10-CM | POA: Diagnosis not present

## 2020-10-03 DIAGNOSIS — I10 Essential (primary) hypertension: Secondary | ICD-10-CM | POA: Diagnosis not present

## 2020-10-03 DIAGNOSIS — H538 Other visual disturbances: Secondary | ICD-10-CM | POA: Diagnosis not present

## 2020-10-03 DIAGNOSIS — I272 Pulmonary hypertension, unspecified: Secondary | ICD-10-CM | POA: Diagnosis not present

## 2020-10-03 DIAGNOSIS — E782 Mixed hyperlipidemia: Secondary | ICD-10-CM | POA: Diagnosis not present

## 2020-11-22 ENCOUNTER — Other Ambulatory Visit: Payer: Self-pay | Admitting: Cardiology

## 2020-11-22 DIAGNOSIS — I080 Rheumatic disorders of both mitral and aortic valves: Secondary | ICD-10-CM

## 2020-12-02 ENCOUNTER — Other Ambulatory Visit: Payer: Self-pay

## 2020-12-02 ENCOUNTER — Encounter: Payer: Self-pay | Admitting: Cardiology

## 2020-12-02 ENCOUNTER — Ambulatory Visit: Payer: Medicare Other | Admitting: Cardiology

## 2020-12-02 VITALS — BP 136/76 | HR 79 | Temp 97.2°F | Resp 16 | Ht 62.0 in | Wt 147.0 lb

## 2020-12-02 DIAGNOSIS — Z8249 Family history of ischemic heart disease and other diseases of the circulatory system: Secondary | ICD-10-CM

## 2020-12-02 DIAGNOSIS — I10 Essential (primary) hypertension: Secondary | ICD-10-CM

## 2020-12-02 DIAGNOSIS — I6523 Occlusion and stenosis of bilateral carotid arteries: Secondary | ICD-10-CM

## 2020-12-02 DIAGNOSIS — F17200 Nicotine dependence, unspecified, uncomplicated: Secondary | ICD-10-CM

## 2020-12-02 DIAGNOSIS — I340 Nonrheumatic mitral (valve) insufficiency: Secondary | ICD-10-CM

## 2020-12-02 DIAGNOSIS — I351 Nonrheumatic aortic (valve) insufficiency: Secondary | ICD-10-CM

## 2020-12-02 NOTE — Progress Notes (Signed)
Mary Ortiz Date of Birth: 05-18-1941 MRN: [id number] Primary Care Provider:Varadarajan, Soyla Murphy, MD Former Cardiology Providers: Dr. Florian Buff Primary Cardiologist: Tessa Lerner, DO, [facility] (established care 01/27/2020)  Date: 12/02/20 Last Office Visit: 03/09/2020  Chief Complaint  Patient presents with  . Atherosclerosis of both carotid arteries  . Results  . Follow-up    HPI  Mary Ortiz is a 80 y.o.  female who presents to the office with a chief complaint of " reevaluation of carotid disease." Patient's past medical history and cardiovascular risk factors include: Active smoking, premature coronary disease in the family, valvular heart disease, bilateral carotid artery stenosis, advanced age.  Patient was being followed by the practice for the management of her valvular heart disease.  Most recent echocardiogram results noted below patient remains asymptomatic with regards to chest pain or heart failure symptoms.  During prior office visit patient was noted to have a carotid bruit on physical examination and therefore recommended to have a carotid duplex.  Patient was noted to have bilateral carotid artery stenosis in the range of 50-69%.  At the last office visit recommended to start aspirin and Crestor.  She was also encouraged regarding importance of complete smoking cessation.  Recommended 30-month follow-up with carotid duplex.  Since last office visit patient states that she took both aspirin and Crestor up until November 2021 and discontinue them because the medications " tore up her stomach."  When asking additional details patient states that she was having diarrhea-like bowel movements at least 9/day.  She is supposed to have a repeat carotid duplex as her 45-month follow-up but chooses not to.  She also continues to smoke at least 0.5-1 pack/day.  Patient denies any symptoms of stroke no prior history of TIA or CVA.  Family history of premature coronary artery  disease: younger sister had her MI at age of 69.   FUNCTIONAL STATUS: No structured exercise program or daily routine.    ALLERGIES: Allergies  Allergen Reactions  . Neosporin Plus Max St Rash    MEDICATION LIST PRIOR TO VISIT: Current Outpatient Medications on File Prior to Visit  Medication Sig Dispense Refill  . amLODipine (NORVASC) 10 MG tablet TAKE 1 TABLET BY MOUTH ONCE DAILY. PLEASE SCHEDULE APPOINTMENT 90 tablet 0  . Cholecalciferol (VITAMIN D3) 50 MCG (2000 UT) TABS Take by mouth daily.    . furosemide (LASIX) 20 MG tablet Take 1 tablet by mouth once daily 90 tablet 0  . meclizine (ANTIVERT) 12.5 MG tablet Take 12.5 mg by mouth every 8 (eight) hours as needed.    Marland Kitchen olmesartan (BENICAR) 5 MG tablet Take by mouth daily.     No current facility-administered medications on file prior to visit.    PAST MEDICAL HISTORY: Past Medical History:  Diagnosis Date  . Carotid artery stenosis   . HTN (hypertension) 01/23/2019  . Hyperlipidemia     PAST SURGICAL HISTORY: Past Surgical History:  Procedure Laterality Date  . BACK SURGERY     1977  . CATARACT EXTRACTION, BILATERAL     2019, 2016  . Hystertectomy  1983    FAMILY HISTORY: The patient's family history includes Heart attack in her brother, father, and mother; Kidney disease in her brother; Liver cancer in her brother; Suicidality in her sister.   SOCIAL HISTORY:  The patient  reports that she has been smoking cigarettes. She has been smoking about 0.25 packs per day. She has never used smokeless tobacco. She reports that she does not drink  alcohol and does not use drugs.  Review of Systems  Constitutional: Negative for chills and fever.  HENT: Negative for hoarse voice and nosebleeds.   Eyes: Negative for discharge, double vision and pain.  Cardiovascular: Negative for chest pain, claudication, dyspnea on exertion, leg swelling, near-syncope, orthopnea, palpitations, paroxysmal nocturnal dyspnea and syncope.   Respiratory: Negative for hemoptysis and shortness of breath.   Musculoskeletal: Negative for muscle cramps and myalgias.  Gastrointestinal: Negative for abdominal pain, constipation, diarrhea, hematemesis, hematochezia, melena, nausea and vomiting.  Neurological: Negative for dizziness and light-headedness.    PHYSICAL EXAM: Vitals with BMI 12/02/2020 03/09/2020 01/27/2020  Height 5\' 2"  5\' 2"  -  Weight 147 lbs 151 lbs 6 oz -  BMI 26.88 27.68 -  Systolic 136 138  Diastolic 76 68 65  Pulse 79 68 69    CONSTITUTIONAL: Well-developed and well-nourished. No acute distress.  SKIN: Skin is warm and dry. No rash noted. No cyanosis. No pallor. No jaundice HEAD: Normocephalic and atraumatic.  EYES: No scleral icterus MOUTH/THROAT: Moist oral membranes.  NECK: No JVD present. No thyromegaly noted.  Left carotid bruit. LYMPHATIC: No visible cervical adenopathy.  CHEST Normal respiratory effort. No intercostal retractions  LUNGS: Clear to auscultation bilaterally.  No stridor. No wheezes. No rales.  CARDIOVASCULAR: Regular, positive S1-S2, holosystolic murmur heard at the apex, 3 on 6 diastolic murmur heard at the left sternal border, no gallops or rubs. ABDOMINAL: No apparent ascites.  EXTREMITIES: No peripheral edema  HEMATOLOGIC: No significant bruising NEUROLOGIC: Oriented to person, place, and time. Nonfocal. Normal muscle tone.  PSYCHIATRIC: Normal mood and affect. Normal behavior. Cooperative  CARDIAC DATABASE: EKG: 12/02/2020: Normal sinus rhythm, 68 bpm, without underlying ischemia or injury pattern.  Echocardiogram: 02/09/2020:  Left ventricle cavity is normal in size. Moderate concentric hypertrophy of the left ventricle. Normal global wall motion. Normal LV systolic function with visual EF 50-55%. Doppler evidence of grade II (pseudonormal) diastolic dysfunction, elevated LAP.  Left atrial cavity is mildly dilated.  Trileaflet aortic valve. Moderate (Grade II) aortic  regurgitation.  Mild to moderate mitral regurgitation.  Mild tricuspid regurgitation. Estimated pulmonary artery systolic pressure 34 mmHg.  No significant change compared tp previous study in 02/2019.   Stress Testing:  None  Heart Catheterization: None  Carotid duplex: 11/2015:  Mild (1-49%) stenosis proximal right internal carotid artery secondary to heterogenous atherosclerotic plaque. Mild (1-49%) stenosis proximal left internal carotid artery secondary to heterogenous atherosclerotic plaque.  02/09/2020:  Stenosis in the right internal carotid artery (50-69%). Stenosis in the right external carotid artery (<50%).  Stenosis in the left internal carotid artery (50-69%). Stenosis in the left external carotid artery (<50%).  Antegrade right vertebral artery flow. Antegrade left vertebral artery flow.  Follow up in six months is appropriate if clinically indicated.   LABORATORY DATA: Lipid Panel     Component Value Date/Time   CHOL 165 03/02/2020 0837   TRIG 136 03/02/2020 0837   HDL 52 03/02/2020 0837   LDLCALC 89 03/02/2020 0837   LABVLDL 24 03/02/2020 0837   IMPRESSION:    ICD-10-CM   1. Atherosclerosis of both carotid arteries  I65.23 EKG 12-Lead  2. Moderate mitral regurgitation  I34.0   3. Mild to moderate aortic regurgitation  I35.1   4. Family history of premature CAD  Z82.49   5. Essential hypertension  I10   6. Smoking  F17.200      RECOMMENDATIONS: Mary Ortiz is a 80 y.o. female whose past medical history and cardiovascular  risk factors include: Active smoking, premature coronary disease in the family, valvular heart disease, bilateral carotid artery atherosclerosis, advanced age.  Bilateral carotid artery stenosis:  Recommended her to be on aspirin 81 mg p.o. daily and statin therapy.  Patient tolerated both aspirin and Crestor up until November 2021 and thereafter discontinued it due to diarrhea like bowel movements.  Recommended a retrial of  aspirin 81 mg p.o. daily and a different statin medication.;  However, patient is adamant that she does not want to be on pharmacological therapy.    Given the degree of carotid artery stenosis also recommended/discussed nonstatin therapies such as Zetia, Nexlizet, Nexletol.  The patient refuses to be on pharmacological therapy at this time.  Encouraged her to stop smoking given her carotid artery stenosis.  Patient states that she will work on it.    Patient was due for a carotid duplex prior to today's office visit but chooses not to for additional year.    Patient is aware of being cognizant of symptoms of vision disturbances and no focal neurological deficits as she can be predisposed to having TIA/CVAs given her TIA/CVA.   Family history of premature coronary artery disease: Given the progressiveness of her carotid atherosclerosis, active smoking, and other risk factors recommended stress test for further risk stratification.  Patient states that she does not want any additional ischemic evaluation at this time.    Benign essential hypertension: Currently managed per primary team  Active smoking: Tobacco cessation counseling: Currently smoking 0.5 - 1 packs/day   Patient was informed of the dangers of tobacco abuse including stroke, cancer, and MI, as well as benefits of tobacco cessation. Patient is not willing to quit at this time. Approximately 7 mins were spent counseling patient cessation techniques. We discussed various methods to help quit smoking, including deciding on a date to quit, joining a support group, pharmacological agents- nicotine gum/patch/lozenges.  I will reassess her progress at the next follow-up visit  Patient is recommended to follow-up with her PCP for the management of her other chronic comorbid conditions.  I will see her back in 1 year as per patient's request and will have a carotid duplex performed prior to appointment.   FINAL MEDICATION LIST END OF  ENCOUNTER: No orders of the defined types were placed in this encounter.   Medications Discontinued During This Encounter  Medication Reason  . aspirin EC 81 MG tablet Error  . rosuvastatin (CRESTOR) 20 MG tablet Error     Current Outpatient Medications:  .  amLODipine (NORVASC) 10 MG tablet, TAKE 1 TABLET BY MOUTH ONCE DAILY. PLEASE SCHEDULE APPOINTMENT, Disp: 90 tablet, Rfl: 0 .  Cholecalciferol (VITAMIN D3) 50 MCG (2000 UT) TABS, Take by mouth daily., Disp: , Rfl:  .  furosemide (LASIX) 20 MG tablet, Take 1 tablet by mouth once daily, Disp: 90 tablet, Rfl: 0 .  meclizine (ANTIVERT) 12.5 MG tablet, Take 12.5 mg by mouth every 8 (eight) hours as needed., Disp: , Rfl:  .  olmesartan (BENICAR) 5 MG tablet, Take by mouth daily., Disp: , Rfl:   Orders Placed This Encounter  Procedures  . EKG 12-Lead   --Continue cardiac medications as reconciled in final medication list. --No follow-ups on file. Or sooner if needed. --Continue follow-up with your primary care physician regarding the management of your other chronic comorbid conditions.  Patient's questions and concerns were addressed to her satisfaction. She voices understanding of the instructions provided during this encounter.   This note was created using  a voice recognition software as a result there may be grammatical errors inadvertently enclosed that do not reflect the nature of this encounter. Every attempt is made to correct such errors.  Rex Kras, [place], [facility]  Pager: [phone] Office: [phone]

## 2020-12-06 ENCOUNTER — Other Ambulatory Visit: Payer: Self-pay

## 2020-12-06 DIAGNOSIS — I6523 Occlusion and stenosis of bilateral carotid arteries: Secondary | ICD-10-CM

## 2021-02-21 ENCOUNTER — Other Ambulatory Visit: Payer: Self-pay | Admitting: Cardiology

## 2021-02-21 DIAGNOSIS — I080 Rheumatic disorders of both mitral and aortic valves: Secondary | ICD-10-CM

## 2021-05-21 ENCOUNTER — Other Ambulatory Visit: Payer: Self-pay | Admitting: Cardiology

## 2021-05-21 DIAGNOSIS — I080 Rheumatic disorders of both mitral and aortic valves: Secondary | ICD-10-CM

## 2021-05-26 ENCOUNTER — Other Ambulatory Visit: Payer: Self-pay | Admitting: Cardiology

## 2021-05-26 DIAGNOSIS — I080 Rheumatic disorders of both mitral and aortic valves: Secondary | ICD-10-CM

## 2021-08-24 ENCOUNTER — Other Ambulatory Visit: Payer: Self-pay | Admitting: Cardiology

## 2021-08-24 DIAGNOSIS — I080 Rheumatic disorders of both mitral and aortic valves: Secondary | ICD-10-CM

## 2021-09-22 ENCOUNTER — Emergency Department (HOSPITAL_COMMUNITY): Payer: Medicare Other | Admitting: Certified Registered Nurse Anesthetist

## 2021-09-22 ENCOUNTER — Emergency Department (HOSPITAL_COMMUNITY): Payer: Medicare Other

## 2021-09-22 ENCOUNTER — Inpatient Hospital Stay (HOSPITAL_COMMUNITY)
Admission: EM | Admit: 2021-09-22 | Discharge: 2021-09-25 | DRG: 024 | Disposition: A | Discharge status: Home Health | Payer: Medicare Other | Attending: Neurology | Admitting: Neurology

## 2021-09-22 ENCOUNTER — Encounter (HOSPITAL_COMMUNITY)
Admission: EM | Disposition: A | Discharge status: Home Health | Payer: Self-pay | Source: Home / Self Care | Attending: Neurology

## 2021-09-22 ENCOUNTER — Inpatient Hospital Stay (HOSPITAL_COMMUNITY): Payer: Medicare Other

## 2021-09-22 DIAGNOSIS — I639 Cerebral infarction, unspecified: Secondary | ICD-10-CM | POA: Diagnosis present

## 2021-09-22 DIAGNOSIS — Z8249 Family history of ischemic heart disease and other diseases of the circulatory system: Secondary | ICD-10-CM

## 2021-09-22 DIAGNOSIS — I272 Pulmonary hypertension, unspecified: Secondary | ICD-10-CM | POA: Diagnosis present

## 2021-09-22 DIAGNOSIS — Z881 Allergy status to other antibiotic agents status: Secondary | ICD-10-CM

## 2021-09-22 DIAGNOSIS — I10 Essential (primary) hypertension: Secondary | ICD-10-CM | POA: Diagnosis present

## 2021-09-22 DIAGNOSIS — I63512 Cerebral infarction due to unspecified occlusion or stenosis of left middle cerebral artery: Secondary | ICD-10-CM | POA: Diagnosis not present

## 2021-09-22 DIAGNOSIS — E785 Hyperlipidemia, unspecified: Secondary | ICD-10-CM | POA: Diagnosis present

## 2021-09-22 DIAGNOSIS — J439 Emphysema, unspecified: Secondary | ICD-10-CM | POA: Diagnosis present

## 2021-09-22 DIAGNOSIS — Z888 Allergy status to other drugs, medicaments and biological substances status: Secondary | ICD-10-CM

## 2021-09-22 DIAGNOSIS — G8191 Hemiplegia, unspecified affecting right dominant side: Secondary | ICD-10-CM | POA: Diagnosis present

## 2021-09-22 DIAGNOSIS — I63412 Cerebral infarction due to embolism of left middle cerebral artery: Secondary | ICD-10-CM | POA: Diagnosis present

## 2021-09-22 DIAGNOSIS — I6602 Occlusion and stenosis of left middle cerebral artery: Secondary | ICD-10-CM

## 2021-09-22 DIAGNOSIS — R4701 Aphasia: Secondary | ICD-10-CM | POA: Diagnosis present

## 2021-09-22 DIAGNOSIS — Z887 Allergy status to serum and vaccine status: Secondary | ICD-10-CM

## 2021-09-22 DIAGNOSIS — I959 Hypotension, unspecified: Secondary | ICD-10-CM | POA: Diagnosis not present

## 2021-09-22 DIAGNOSIS — F17210 Nicotine dependence, cigarettes, uncomplicated: Secondary | ICD-10-CM | POA: Diagnosis present

## 2021-09-22 DIAGNOSIS — Z978 Presence of other specified devices: Secondary | ICD-10-CM

## 2021-09-22 DIAGNOSIS — Z20822 Contact with and (suspected) exposure to covid-19: Secondary | ICD-10-CM | POA: Diagnosis present

## 2021-09-22 DIAGNOSIS — R29810 Facial weakness: Secondary | ICD-10-CM | POA: Diagnosis present

## 2021-09-22 DIAGNOSIS — D72829 Elevated white blood cell count, unspecified: Secondary | ICD-10-CM | POA: Diagnosis present

## 2021-09-22 DIAGNOSIS — I119 Hypertensive heart disease without heart failure: Secondary | ICD-10-CM | POA: Diagnosis present

## 2021-09-22 DIAGNOSIS — Z880 Allergy status to penicillin: Secondary | ICD-10-CM | POA: Diagnosis not present

## 2021-09-22 DIAGNOSIS — I9589 Other hypotension: Secondary | ICD-10-CM | POA: Diagnosis not present

## 2021-09-22 DIAGNOSIS — N179 Acute kidney failure, unspecified: Secondary | ICD-10-CM | POA: Diagnosis present

## 2021-09-22 DIAGNOSIS — Z79899 Other long term (current) drug therapy: Secondary | ICD-10-CM | POA: Diagnosis not present

## 2021-09-22 DIAGNOSIS — I6523 Occlusion and stenosis of bilateral carotid arteries: Secondary | ICD-10-CM | POA: Diagnosis present

## 2021-09-22 DIAGNOSIS — R29715 NIHSS score 15: Secondary | ICD-10-CM | POA: Diagnosis present

## 2021-09-22 DIAGNOSIS — R001 Bradycardia, unspecified: Secondary | ICD-10-CM | POA: Diagnosis not present

## 2021-09-22 HISTORY — PX: RADIOLOGY WITH ANESTHESIA: SHX6223

## 2021-09-22 HISTORY — PX: IR CT HEAD LTD: IMG2386

## 2021-09-22 HISTORY — PX: IR PERCUTANEOUS ART THROMBECTOMY/INFUSION INTRACRANIAL INC DIAG ANGIO: IMG6087

## 2021-09-22 LAB — DIFFERENTIAL
Abs Immature Granulocytes: 0.05 10*3/uL (ref 0.00–0.07)
Basophils Absolute: 0.1 10*3/uL (ref 0.0–0.1)
Basophils Relative: 0 %
Eosinophils Absolute: 0.1 10*3/uL (ref 0.0–0.5)
Eosinophils Relative: 1 %
Immature Granulocytes: 0 %
Lymphocytes Relative: 9 %
Lymphs Abs: 1.2 10*3/uL (ref 0.7–4.0)
Monocytes Absolute: 0.5 10*3/uL (ref 0.1–1.0)
Monocytes Relative: 4 %
Neutro Abs: 11.6 10*3/uL — ABNORMAL HIGH (ref 1.7–7.7)
Neutrophils Relative %: 86 %

## 2021-09-22 LAB — CBC
HCT: 36.2 % (ref 36.0–46.0)
Hemoglobin: 12.5 g/dL (ref 12.0–15.0)
MCH: 33.1 pg (ref 26.0–34.0)
MCHC: 34.5 g/dL (ref 30.0–36.0)
MCV: 95.8 fL (ref 80.0–100.0)
Platelets: 193 10*3/uL (ref 150–400)
RBC: 3.78 MIL/uL — ABNORMAL LOW (ref 3.87–5.11)
RDW: 13.5 % (ref 11.5–15.5)
WBC: 13.4 10*3/uL — ABNORMAL HIGH (ref 4.0–10.5)
nRBC: 0 % (ref 0.0–0.2)

## 2021-09-22 LAB — POCT I-STAT 7, (LYTES, BLD GAS, ICA,H+H)
Acid-base deficit: 5 mmol/L — ABNORMAL HIGH (ref 0.0–2.0)
Bicarbonate: 22.6 mmol/L (ref 20.0–28.0)
Calcium, Ion: 1.23 mmol/L (ref 1.15–1.40)
HCT: 31 % — ABNORMAL LOW (ref 36.0–46.0)
Hemoglobin: 10.5 g/dL — ABNORMAL LOW (ref 12.0–15.0)
O2 Saturation: 100 %
Patient temperature: 98.6
Potassium: 4.5 mmol/L (ref 3.5–5.1)
Sodium: 139 mmol/L (ref 135–145)
TCO2: 24 mmol/L (ref 22–32)
pCO2 arterial: 53.1 mmHg — ABNORMAL HIGH (ref 32–48)
pH, Arterial: 7.236 — ABNORMAL LOW (ref 7.35–7.45)
pO2, Arterial: 330 mmHg — ABNORMAL HIGH (ref 83–108)

## 2021-09-22 LAB — COMPREHENSIVE METABOLIC PANEL
ALT: 14 U/L (ref 0–44)
AST: 15 U/L (ref 15–41)
Albumin: 3.8 g/dL (ref 3.5–5.0)
Alkaline Phosphatase: 56 U/L (ref 38–126)
Anion gap: 10 (ref 5–15)
BUN: 20 mg/dL (ref 8–23)
CO2: 21 mmol/L — ABNORMAL LOW (ref 22–32)
Calcium: 9.1 mg/dL (ref 8.9–10.3)
Chloride: 107 mmol/L (ref 98–111)
Creatinine, Ser: 1.47 mg/dL — ABNORMAL HIGH (ref 0.44–1.00)
GFR, Estimated: 35 mL/min — ABNORMAL LOW (ref >60)
Glucose, Bld: 116 mg/dL — ABNORMAL HIGH (ref 70–99)
Potassium: 4.4 mmol/L (ref 3.5–5.1)
Sodium: 138 mmol/L (ref 135–145)
Total Bilirubin: 0.5 mg/dL (ref 0.3–1.2)
Total Protein: 6.7 g/dL (ref 6.5–8.1)

## 2021-09-22 LAB — I-STAT CHEM 8, ED
BUN: 23 mg/dL (ref 8–23)
Calcium, Ion: 1.11 mmol/L — ABNORMAL LOW (ref 1.15–1.40)
Chloride: 106 mmol/L (ref 98–111)
Creatinine, Ser: 1.4 mg/dL — ABNORMAL HIGH (ref 0.44–1.00)
Glucose, Bld: 112 mg/dL — ABNORMAL HIGH (ref 70–99)
HCT: 38 % (ref 36.0–46.0)
Hemoglobin: 12.9 g/dL (ref 12.0–15.0)
Potassium: 4.4 mmol/L (ref 3.5–5.1)
Sodium: 138 mmol/L (ref 135–145)
TCO2: 23 mmol/L (ref 22–32)

## 2021-09-22 LAB — ECHOCARDIOGRAM COMPLETE
Area-P 1/2: 3.24 cm2
Calc EF: 65.7 %
Height: 62 in
MV M vel: 4.68 m/s
MV Peak grad: 87.6 mmHg
Radius: 0.6 cm
S' Lateral: 3.2 cm
Single Plane A2C EF: 57.6 %
Single Plane A4C EF: 69.9 %
Weight: 2433.88 oz

## 2021-09-22 LAB — RESP PANEL BY RT-PCR (FLU A&B, COVID) ARPGX2
Influenza A by PCR: NEGATIVE
Influenza B by PCR: NEGATIVE
SARS Coronavirus 2 by RT PCR: NEGATIVE

## 2021-09-22 LAB — CBG MONITORING, ED: Glucose-Capillary: 115 mg/dL — ABNORMAL HIGH (ref 70–99)

## 2021-09-22 LAB — PROTIME-INR
INR: 1 (ref 0.8–1.2)
Prothrombin Time: 13.1 seconds (ref 11.4–15.2)

## 2021-09-22 LAB — APTT: aPTT: 30 seconds (ref 24–36)

## 2021-09-22 IMAGING — XA IR PERCUTANEOUS ART THORMBECTOMY/INFUSION INTRACRANIAL INCLUDE D
8 of 9 series · 12 of 24 positions shown · IV contrast (IODINE)
Comparison: CT angiogram of the head and neck [DATE].

INDICATION: New onset aphasia and right-sided hemiplegia and left gaze
deviation.

Occlusion of the left middle cerebral M1 segment on CT angiogram of
the head and neck
EXAM:
1. EMERGENT LARGE VESSEL OCCLUSION THROMBOLYSIS (anterior
CIRCULATION)
TECHNIQUE: Following a full explanation of the procedure along with the
potential associated complications, an informed witnessed consent
was obtained. The risks of intracranial hemorrhage of 10%, worsening
neurological deficit, ventilator dependency, death and inability to
revascularize were all reviewed in detail with the patient's nephew.

[Series 1: cerebral care 2 · 2 acquisitions, 1 frame shown (1 of 2)]
[im 1/2]
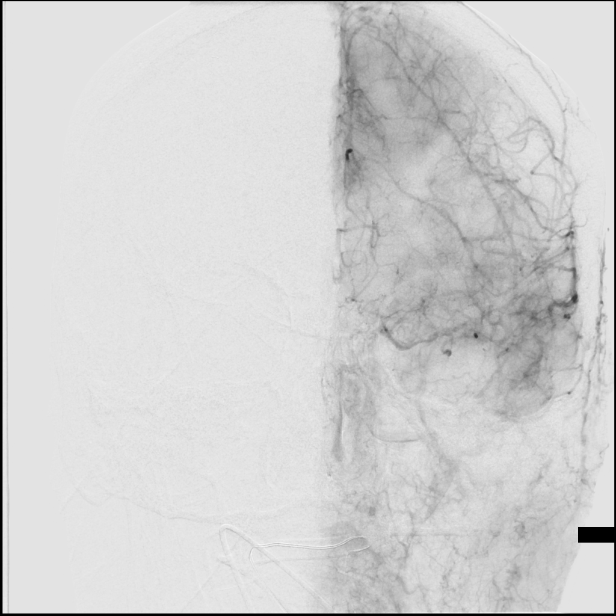

[Series 2: cerebral care 2 · 2 acquisitions, 1 frame shown (2 of 2)]
[im 1/2]
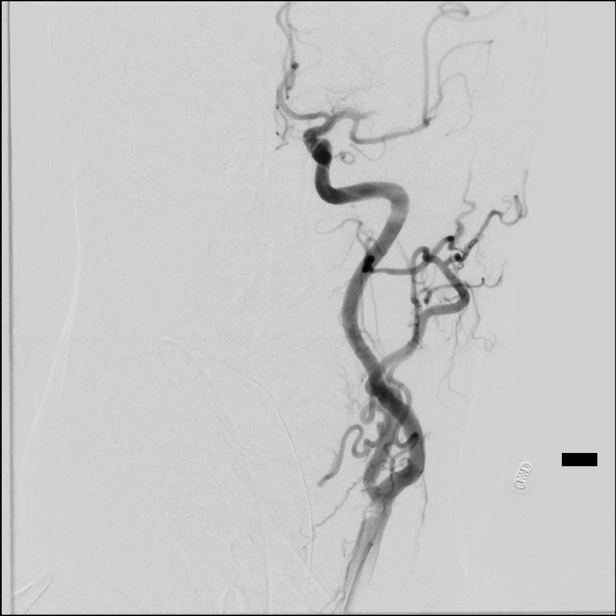

[Series 4: cerebral · 2 acquisitions, 1 frame shown (1 of 4)]
[im 1/2]
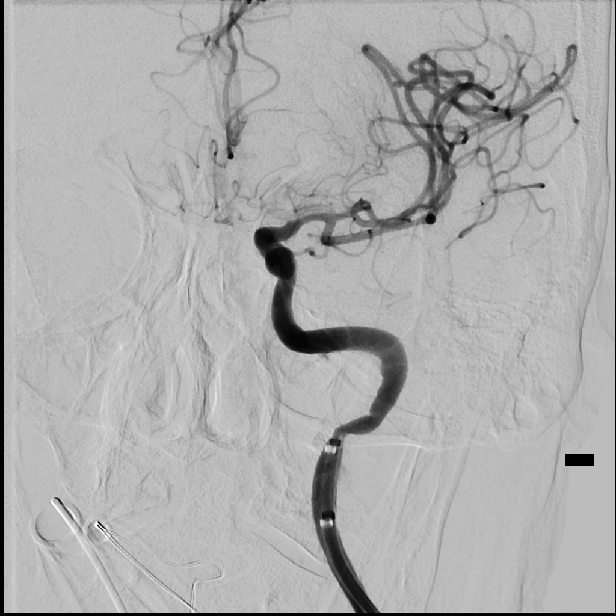

[Series 5: single · 1 of 2 slices shown]
[im 1/2]
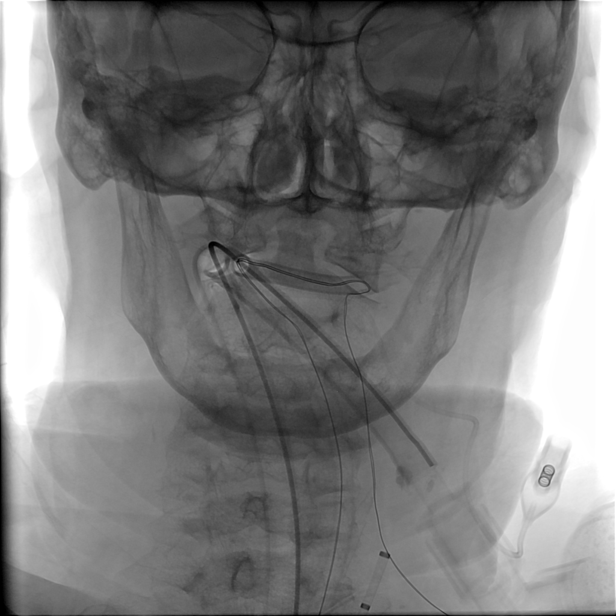

[Series 7: cerebral · 2 acquisitions, 1 frame shown (2 of 4)]
[im 1/2]
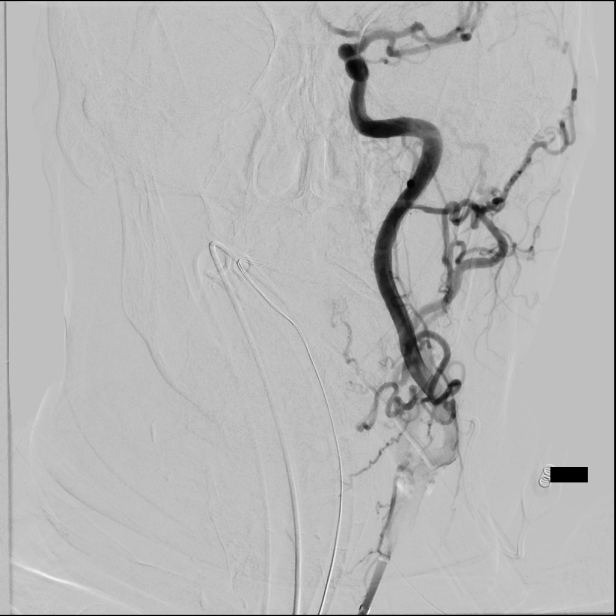

[Series 9: cerebral · 2 acquisitions, 1 frame shown (3 of 4)]
[im 1/2]
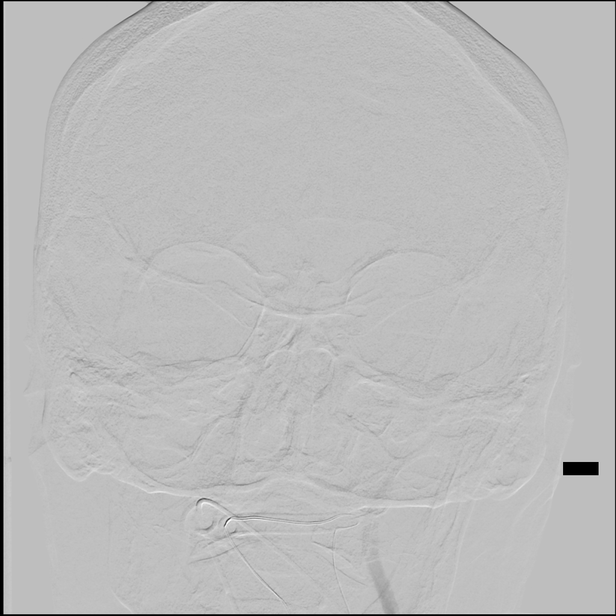

[Series 10: cerebral · 2 acquisitions, 2 frames shown (4 of 4)]
[im 1/2]
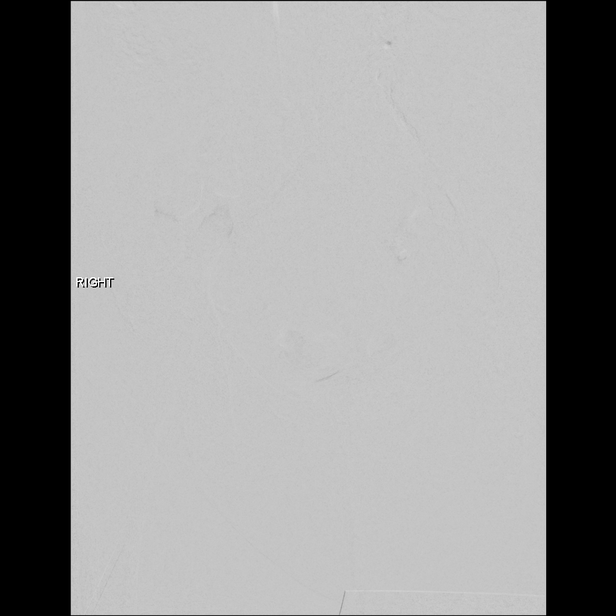
[im 1/2]
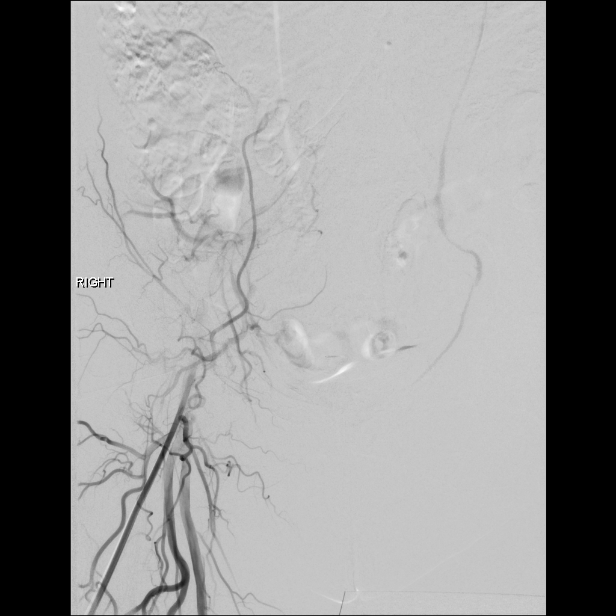

[Series 300: dr. (person_name). · 4 of 89 slices shown]
[im 21/89]
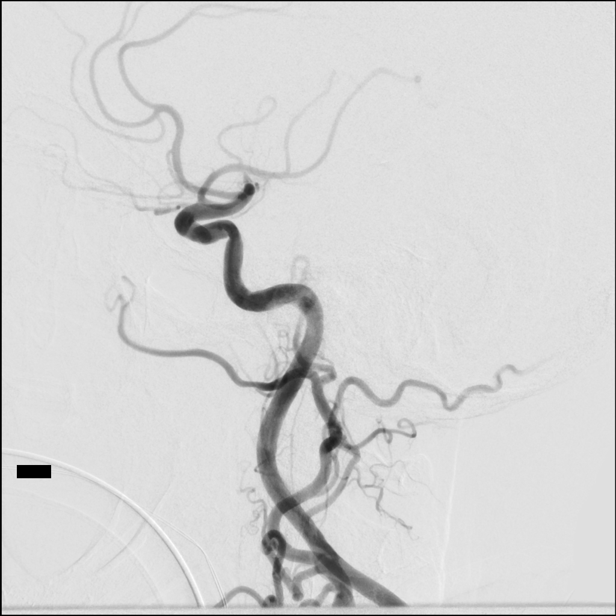
[im 41/89]
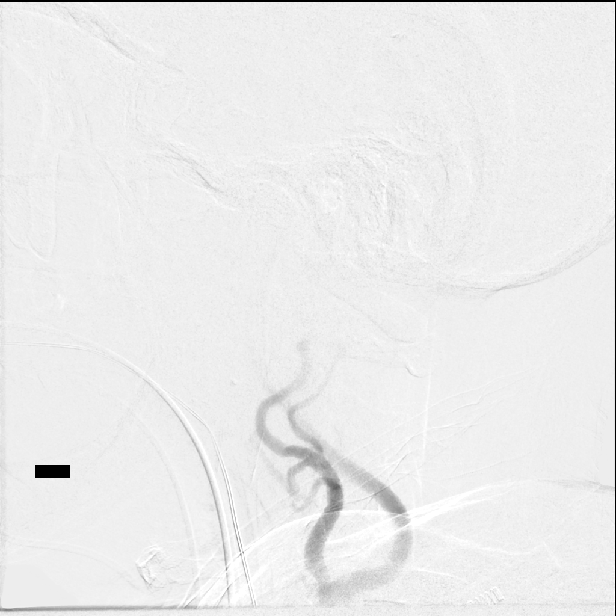
[im 68/89]
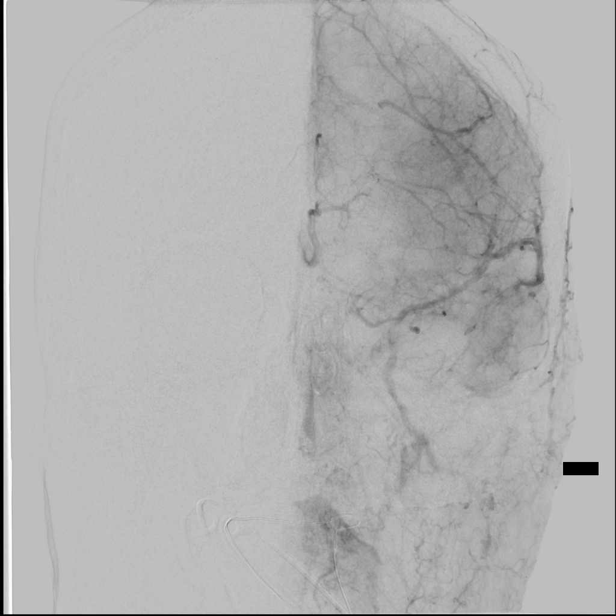
[im 89/89]
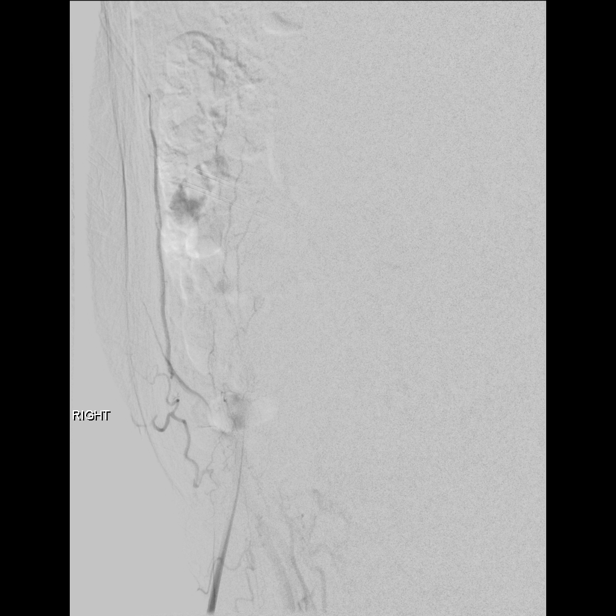

[12 of 24 positions shown; findings below may reference images not displayed]

MEDICATIONS:
Ancef 2 g IV antibiotic was administered within 1 hour of the
procedure.

ANESTHESIA/SEDATION:
General anesthesia.

CONTRAST:  Omnipaque 300 approximately 50 mL.

FLUOROSCOPY TIME:  Fluoroscopy Time: 12 minutes 24 seconds (540
mGy).

COMPLICATIONS:
None immediate.
The patient was then put under general anesthesia by the [REDACTED] at [HOSPITAL].

The right groin was prepped and draped in the usual sterile fashion.
Thereafter using modified Seldinger technique, transfemoral access
into the right common femoral artery was obtained without
difficulty. Over a 0.035 inch guidewire an 8 French 25 cm Pinnacle
sheath was inserted. Through this, and also over a 0.035 inch
guidewire a 5 French JB 1 catheter was advanced to the aortic arch
region and selectively positioned in the left common carotid artery.
FINDINGS: Left common carotid arteriogram demonstrates mild-to-moderate
stenosis of the distal left common carotid artery secondary to a
partially calcified plaque.

The left external carotid artery and its major branches are widely
patent.

The left internal carotid artery at the bulb to the cranial skull
base demonstrates wide patency.

The petrous, the cavernous and the supraclinoid segments demonstrate
wide patency.

The left middle cerebral artery M1 segment is developmentally short.

Complete occlusion of the left middle cerebral artery M1 segment is
demonstrated. The codominant anterior temporal branch demonstrates
wide patency into the capillary and venous phases.

Left anterior cerebral artery opacifies into the capillary and
venous phases.

PROCEDURE:
The diagnostic JB 1 catheter in the left common carotid artery was
exchanged over a 0.035 inch 300 cm Rosen exchange guidewire for an
087 95 cm balloon guide catheter which had been prepped with 50%
contrast and 50% heparinized saline infusion.

The guidewire was removed. Good aspiration was obtained from the hub
of the balloon guide catheter which was then advanced into the
proximal M1 segment.

A gentle control arteriogram performed through this demonstrated no
evidence of spasms, dissections or of intraluminal filling defects.

Over a 0.014 inch Aristotle micro guidewire with a moderate J
configuration, a combination of an 021 150 cm microcatheter inside
of a 55 132 cm Zoom aspiration catheter was advanced to the
supraclinoid left ICA.

The micro guidewire was then gently manipulated through the occluded
left middle cerebral artery M1 segment into the inferior division M2
M3 region followed by the microcatheter. Micro guidewire was
removed. Good aspiration obtained from the hub of the microcatheter.
A gentle control arteriogram performed through this demonstrates
safe positioning of the tip of the microcatheter which was then
connected to continuous heparinized saline infusion.

A 5 mm x 37 mm Embotrap retrieval device was then advanced to the
distal end of the microcatheter.

The O ring on the delivery microcatheter was loosened. With slight
forward gentle traction with the right hand on the delivery micro
guidewire with the left hand the delivery microcatheter was
retrieved deploying the retrieval device. The proximal portion of
the retrieval device engaged to the proximal portion of the occluded
segment of the M1 region.

With proximal flow arrest in the left internal carotid artery and
constant aspiration applied at the hub of the Zoom aspiration
catheter which had now been advanced to engage the proximal portion
of the clot for approximately 2 minutes, the combination of the
retrieval device, the microcatheter, and the Zoom aspiration
catheter were retrieved and removed. Following reversal of flow
arrest, a control arteriogram performed through the balloon guide
catheter demonstrated a complete revascularization of the left
middle cerebral artery achieving a TICI 3 revascularization.

The left anterior cerebral artery remained widely patent unchanged.

A final control arteriogram performed through the balloon guide in
the left common carotid artery continued to demonstrate excellent
flow through the left internal carotid artery intracranially and
extra cranially.

The balloon guide catheter was retrieved and removed. The 8 French
Pinnacle sheath was removed with hemostasis achieved with the 8
French Angio-Seal closure device. Distal pulses remained Dopplerable
in both feet unchanged.

A CT of the brain demonstrated no evidence of intra cerebral
hemorrhage.

Minimal hyperattenuation was seen in the anterior aspect of the
sylvian fissure.

Patient was left intubated awaiting COVID 19 results.

Patient was then transferred to the neuro ICU for post
revascularization care.
IMPRESSION: Status post endovascular complete revascularization of left middle
cerebral artery M1 segment with 1 pass with a 5 mm x 37 mm Embotrap
retrieval device and contact aspiration achieving a TICI 3
revascularization.

PLAN:
As per referring [facility].

## 2021-09-22 IMAGING — DX DG CHEST 1V PORT
1 series · 2 of 2 positions shown · non-contrast
Comparison: None

CLINICAL DATA: Code stroke.

EXAM:
PORTABLE CHEST 1 VIEW

[Series 1: chest · 0.14mm/px · 2 of 2 slices shown]
[im 1/2]
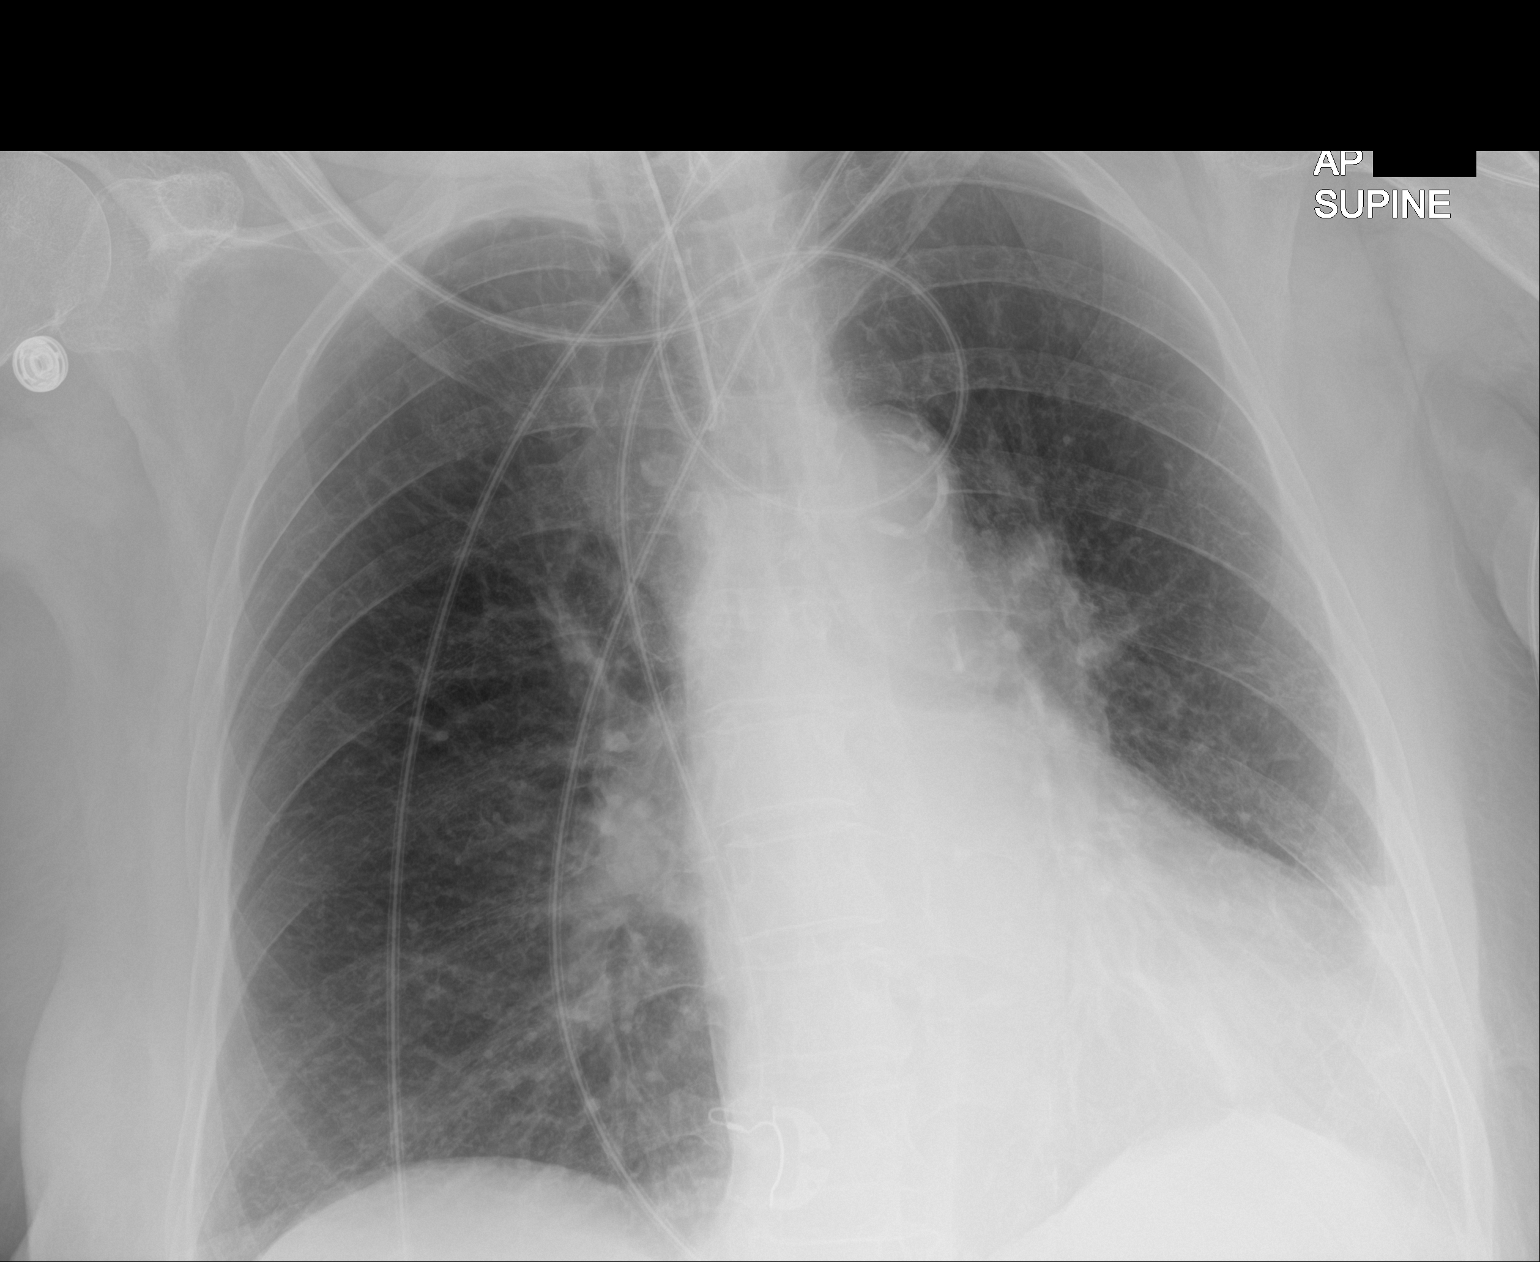
[im 2/2]
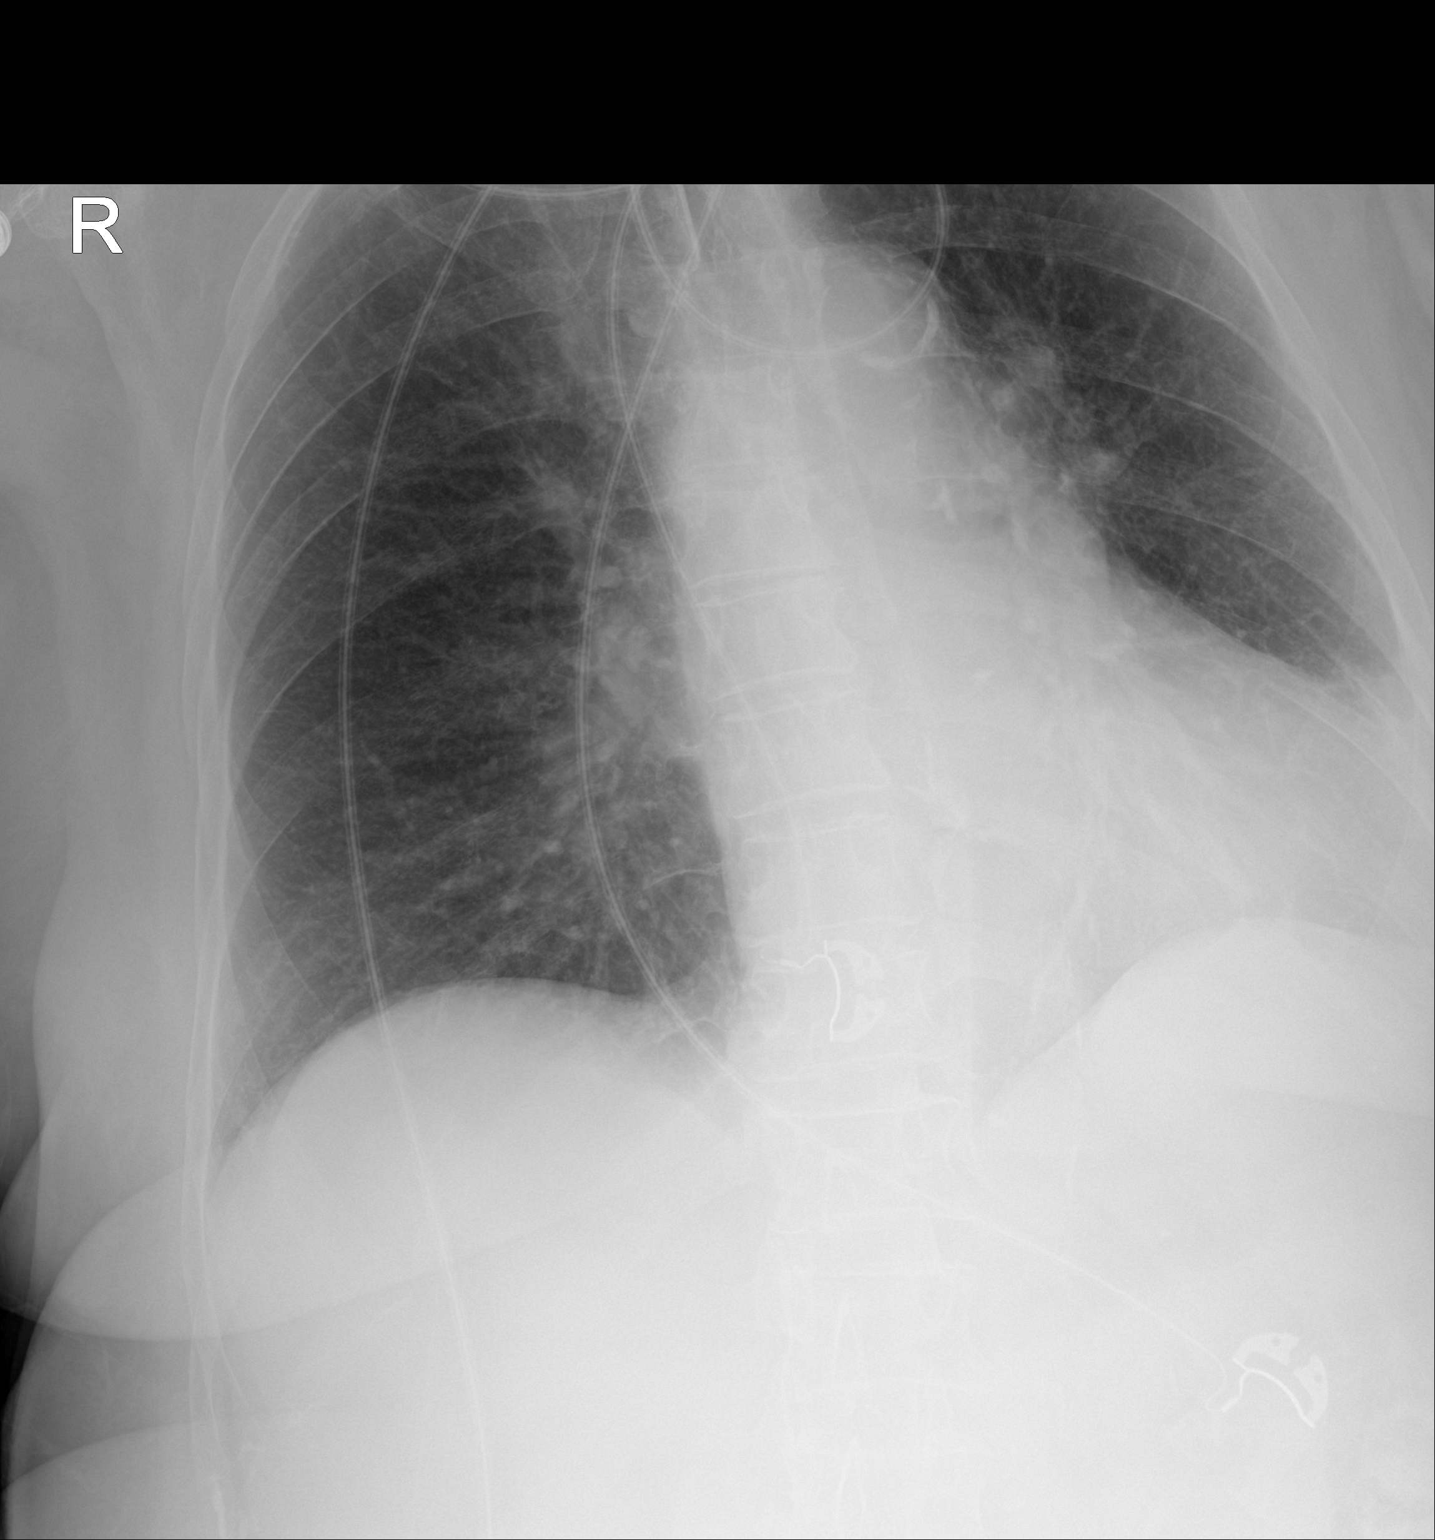

[2 of 2 positions shown; findings below may reference images not displayed]

FINDINGS: 2 portable supine radiographs of the chest. Endotracheal tube
terminates 4.3 cm above carina. Mild cardiomegaly. Atherosclerosis
in the transverse aorta. The left pleural space is poorly evaluated
secondary to overlying soft tissues. No right pleural fluid and no
pneumothorax. No congestive failure. No lobar consolidation.
Probable patchy left base airspace disease.
IMPRESSION: Endotracheal tube appropriately positioned.

Limited portable radiographs with suspicion of patchy left base
airspace disease. Most likely atelectasis. Consider radiographic
follow-up if concern for pneumonia or aspiration.

Aortic Atherosclerosis ([D7]-[D7]).

## 2021-09-22 SURGERY — IR WITH ANESTHESIA
Anesthesia: General

## 2021-09-22 MED ORDER — PHENYLEPHRINE HCL-NACL 20-0.9 MG/250ML-% IV SOLN
25.0000 ug/min | INTRAVENOUS | Status: DC
  Administered 2021-09-22: 130 ug/min via INTRAVENOUS
  Administered 2021-09-22: 15 ug/min via INTRAVENOUS
  Administered 2021-09-22: 125 ug/min via INTRAVENOUS
  Administered 2021-09-22: 25 ug/min via INTRAVENOUS
  Administered 2021-09-23: 50 ug/min via INTRAVENOUS
  Administered 2021-09-23: 100 ug/min via INTRAVENOUS
  Administered 2021-09-23: 120 ug/min via INTRAVENOUS
  Administered 2021-09-23: 150 ug/min via INTRAVENOUS
  Administered 2021-09-23 (×2): 130 ug/min via INTRAVENOUS
  Administered 2021-09-23: 125 ug/min via INTRAVENOUS
  Administered 2021-09-24: 40 ug/min via INTRAVENOUS
  Filled 2021-09-22 (×3): qty 250
  Filled 2021-09-22: qty 500
  Filled 2021-09-22 (×6): qty 250

## 2021-09-22 MED ORDER — IOHEXOL 350 MG/ML SOLN
100.0000 mL | Freq: Once | INTRAVENOUS | Status: AC | PRN
  Administered 2021-09-22: 100 mL via INTRAVENOUS

## 2021-09-22 MED ORDER — IOHEXOL 300 MG/ML  SOLN
100.0000 mL | Freq: Once | INTRAMUSCULAR | Status: AC | PRN
  Administered 2021-09-22: 50 mL via INTRA_ARTERIAL

## 2021-09-22 MED ORDER — ACETAMINOPHEN 325 MG PO TABS: 650.0000 mg | ORAL_TABLET | ORAL | Status: DC | PRN

## 2021-09-22 MED ORDER — EPTIFIBATIDE 20 MG/10ML IV SOLN
INTRAVENOUS | Status: AC
  Filled 2021-09-22: qty 10

## 2021-09-22 MED ORDER — ACETAMINOPHEN 160 MG/5ML PO SOLN: 650.0000 mg | ORAL | Status: DC | PRN

## 2021-09-22 MED ORDER — DOCUSATE SODIUM 50 MG/5ML PO LIQD
100.0000 mg | Freq: Two times a day (BID) | ORAL | Status: DC
  Filled 2021-09-22: qty 10

## 2021-09-22 MED ORDER — SODIUM CHLORIDE 0.9% FLUSH: 3.0000 mL | Freq: Once | INTRAVENOUS | Status: DC

## 2021-09-22 MED ORDER — SODIUM CHLORIDE (PF) 0.9 % IJ SOLN
INTRAVENOUS | Status: AC | PRN
  Administered 2021-09-22 (×2): 25 ug via INTRA_ARTERIAL

## 2021-09-22 MED ORDER — PHENYLEPHRINE HCL-NACL 20-0.9 MG/250ML-% IV SOLN
INTRAVENOUS | Status: DC | PRN
  Administered 2021-09-22: 25 ug/min via INTRAVENOUS

## 2021-09-22 MED ORDER — ACETAMINOPHEN 650 MG RE SUPP: 650.0000 mg | RECTAL | Status: DC | PRN

## 2021-09-22 MED ORDER — LIDOCAINE HCL 1 % IJ SOLN
INTRAMUSCULAR | Status: AC
  Filled 2021-09-22: qty 20

## 2021-09-22 MED ORDER — ORAL CARE MOUTH RINSE: 15.0000 mL | Freq: Two times a day (BID) | OROMUCOSAL | Status: DC

## 2021-09-22 MED ORDER — FENTANYL CITRATE (PF) 100 MCG/2ML IJ SOLN
INTRAMUSCULAR | Status: DC | PRN
  Administered 2021-09-22: 50 ug via INTRAVENOUS

## 2021-09-22 MED ORDER — LIDOCAINE 2% (20 MG/ML) 5 ML SYRINGE
INTRAMUSCULAR | Status: DC | PRN
  Administered 2021-09-22: 60 mg via INTRAVENOUS

## 2021-09-22 MED ORDER — PROPOFOL 500 MG/50ML IV EMUL
INTRAVENOUS | Status: DC | PRN
  Administered 2021-09-22: 50 ug/kg/min via INTRAVENOUS

## 2021-09-22 MED ORDER — SODIUM CHLORIDE 0.9 % IV SOLN: INTRAVENOUS | Status: DC

## 2021-09-22 MED ORDER — SUCCINYLCHOLINE CHLORIDE 200 MG/10ML IV SOSY
PREFILLED_SYRINGE | INTRAVENOUS | Status: DC | PRN
  Administered 2021-09-22: 100 mg via INTRAVENOUS

## 2021-09-22 MED ORDER — PHENYLEPHRINE 40 MCG/ML (10ML) SYRINGE FOR IV PUSH (FOR BLOOD PRESSURE SUPPORT)
PREFILLED_SYRINGE | INTRAVENOUS | Status: DC | PRN
Start: 2021-09-22 — End: 2021-09-22
  Administered 2021-09-22: 40 ug via INTRAVENOUS

## 2021-09-22 MED ORDER — ORAL CARE MOUTH RINSE: 15.0000 mL | OROMUCOSAL | Status: DC

## 2021-09-22 MED ORDER — ROCURONIUM BROMIDE 10 MG/ML (PF) SYRINGE
PREFILLED_SYRINGE | INTRAVENOUS | Status: DC | PRN
  Administered 2021-09-22: 50 mg via INTRAVENOUS

## 2021-09-22 MED ORDER — CEFAZOLIN SODIUM-DEXTROSE 2-3 GM-%(50ML) IV SOLR
INTRAVENOUS | Status: DC | PRN
  Administered 2021-09-22: 2 g via INTRAVENOUS

## 2021-09-22 MED ORDER — SENNOSIDES-DOCUSATE SODIUM 8.6-50 MG PO TABS: 1.0000 | ORAL_TABLET | Freq: Every evening | ORAL | Status: DC | PRN

## 2021-09-22 MED ORDER — SODIUM CHLORIDE 0.9 % IV SOLN: 250.0000 mL | INTRAVENOUS | Status: DC

## 2021-09-22 MED ORDER — EPHEDRINE SULFATE-NACL 50-0.9 MG/10ML-% IV SOSY
PREFILLED_SYRINGE | INTRAVENOUS | Status: DC | PRN
  Administered 2021-09-22: 5 mg via INTRAVENOUS

## 2021-09-22 MED ORDER — SODIUM CHLORIDE 0.9 % IV SOLN: INTRAVENOUS | Status: DC | PRN

## 2021-09-22 MED ORDER — STROKE: EARLY STAGES OF RECOVERY BOOK
Freq: Once | Status: AC
  Filled 2021-09-22: qty 1

## 2021-09-22 MED ORDER — CHLORHEXIDINE GLUCONATE 0.12% ORAL RINSE (MEDLINE KIT): 15.0000 mL | Freq: Two times a day (BID) | OROMUCOSAL | Status: DC

## 2021-09-22 MED ORDER — POLYETHYLENE GLYCOL 3350 17 G PO PACK: 17.0000 g | PACK | Freq: Every day | ORAL | Status: DC

## 2021-09-22 MED ORDER — CANGRELOR TETRASODIUM 50 MG IV SOLR
INTRAVENOUS | Status: AC
  Filled 2021-09-22: qty 50

## 2021-09-22 MED ORDER — TIROFIBAN HCL IN NACL 5-0.9 MG/100ML-% IV SOLN
INTRAVENOUS | Status: AC
  Filled 2021-09-22: qty 100

## 2021-09-22 MED ORDER — HEPARIN SODIUM (PORCINE) 5000 UNIT/ML IJ SOLN
5000.0000 [IU] | Freq: Three times a day (TID) | INTRAMUSCULAR | Status: DC
  Administered 2021-09-22 – 2021-09-23 (×3): 5000 [IU] via SUBCUTANEOUS
  Filled 2021-09-22 (×3): qty 1

## 2021-09-22 MED ORDER — CEFAZOLIN SODIUM-DEXTROSE 2-4 GM/100ML-% IV SOLN
INTRAVENOUS | Status: AC
  Filled 2021-09-22: qty 100

## 2021-09-22 MED ORDER — NITROGLYCERIN 1 MG/10 ML FOR IR/CATH LAB
INTRA_ARTERIAL | Status: AC
  Filled 2021-09-22: qty 10

## 2021-09-22 MED ORDER — ALBUMIN HUMAN 5 % IV SOLN: INTRAVENOUS | Status: DC | PRN

## 2021-09-22 MED ORDER — CLEVIDIPINE BUTYRATE 0.5 MG/ML IV EMUL
0.0000 mg/h | INTRAVENOUS | Status: DC
  Filled 2021-09-22: qty 50

## 2021-09-22 MED ORDER — FENTANYL CITRATE PF 50 MCG/ML IJ SOSY: 25.0000 ug | PREFILLED_SYRINGE | INTRAMUSCULAR | Status: DC | PRN

## 2021-09-22 MED ORDER — PROPOFOL 1000 MG/100ML IV EMUL
0.0000 ug/kg/min | INTRAVENOUS | Status: DC
  Administered 2021-09-22: 29.952 ug/kg/min via INTRAVENOUS
  Filled 2021-09-22: qty 100

## 2021-09-22 MED ORDER — CHLORHEXIDINE GLUCONATE 0.12 % MT SOLN
15.0000 mL | Freq: Two times a day (BID) | OROMUCOSAL | Status: DC
  Administered 2021-09-22: 15 mL via OROMUCOSAL

## 2021-09-22 MED ORDER — CLOPIDOGREL BISULFATE 300 MG PO TABS
ORAL_TABLET | ORAL | Status: AC
  Filled 2021-09-22: qty 1

## 2021-09-22 MED ORDER — ONDANSETRON HCL 4 MG/2ML IJ SOLN
4.0000 mg | Freq: Four times a day (QID) | INTRAMUSCULAR | Status: DC | PRN
  Administered 2021-09-22: 4 mg via INTRAVENOUS

## 2021-09-22 MED ORDER — ASPIRIN 81 MG PO CHEW
CHEWABLE_TABLET | ORAL | Status: AC
  Filled 2021-09-22: qty 1

## 2021-09-22 MED ORDER — ONDANSETRON HCL 4 MG/2ML IJ SOLN
INTRAMUSCULAR | Status: AC
  Filled 2021-09-22: qty 2

## 2021-09-22 MED ORDER — VERAPAMIL HCL 2.5 MG/ML IV SOLN
INTRAVENOUS | Status: AC
  Filled 2021-09-22: qty 2

## 2021-09-22 MED ORDER — TICAGRELOR 90 MG PO TABS
ORAL_TABLET | ORAL | Status: AC
  Filled 2021-09-22: qty 2

## 2021-09-22 MED ORDER — PROPOFOL 10 MG/ML IV BOLUS
INTRAVENOUS | Status: DC | PRN
  Administered 2021-09-22: 150 mg via INTRAVENOUS

## 2021-09-22 NOTE — Transfer of Care (Signed)
Immediate Anesthesia Transfer of Care Note ? ?Patient: Mary Ortiz ? ?Procedure(s) Performed: IR WITH ANESTHESIA ? ?Patient Location: ICU ? ?Anesthesia Type:General ? ?Level of Consciousness: sedated, unresponsive and Patient remains intubated per anesthesia plan ? ?Airway & Oxygen Therapy: Patient remains intubated per anesthesia plan and Patient placed on Ventilator (see vital sign flow sheet for setting) ? ?Post-op Assessment: Report given to RN and Post -op Vital signs reviewed and stable ? ?Post vital signs: Reviewed and stable ? ?Last Vitals:  ?Vitals Value Taken Time  ?BP 160/70 09/22/21 1358  ?Temp    ?Pulse 70 09/22/21 1401  ?Resp 16 09/22/21 1401  ?SpO2 100 % 09/22/21 1401  ?Vitals shown include unvalidated device data. ? ?Last Pain: There were no vitals filed for this visit.   ? ?  ? ?Complications: No notable events documented. ?

## 2021-09-22 NOTE — Progress Notes (Signed)
Patient's clothihng: pink robe, long sleev shirt, pajama pants, and socks given to niece, Tammy.  ? ?Also gave, two yellow metal rings with clear stone along with one yellow metal necklace and driver license to niece, Tammy.  ?

## 2021-09-22 NOTE — ED Triage Notes (Signed)
Pt bib [facility] from home where she lives alone. Pt is brought in as a code stroke after daughter calling her this am at 57 and the pt being unable to speak to her. EMS arrived to house and had to force entry, pt found on floor laying on her right side. Pt presents to ED with Right sided facial droop and aphasia. No obvious trauma noted. Pt taken to CT and consent from family received. Pt taken to IR with stroke nurse.  ?EMS vitals: 140SBP, 119CBG, 100% RA ?

## 2021-09-22 NOTE — Anesthesia Postprocedure Evaluation (Signed)
Anesthesia Post Note ? ?Patient: Mary Ortiz ? ?Procedure(s) Performed: IR WITH ANESTHESIA ? ?  ? ?Patient location during evaluation: PACU ?Anesthesia Type: General ?Level of consciousness: awake and alert ?Pain management: pain level controlled ?Vital Signs Assessment: post-procedure vital signs reviewed and stable ?Respiratory status: spontaneous breathing, nonlabored ventilation and respiratory function stable ?Cardiovascular status: blood pressure returned to baseline and stable ?Postop Assessment: no apparent nausea or vomiting ?Anesthetic complications: no ? ? ?No notable events documented. ? ?Last Vitals:  ?Vitals:  ? 09/22/21 1405 09/22/21 1415  ?BP: (!) 137/55 (!) 116/47  ?Pulse: 60 (!) 48  ?Resp: (!) 21 11  ?SpO2: 100% 100%  ?  ?Last Pain: There were no vitals filed for this visit. ? ?  ?  ?  ?  ?  ?  ? ?Lynda Rainwater ? ? ? ? ?

## 2021-09-22 NOTE — Anesthesia Preprocedure Evaluation (Signed)
Anesthesia Evaluation  ? ? ?Airway ?Mallampati: II ? ?TM Distance: >3 FB ?Neck ROM: Full ? ? ? Dental ?no notable dental hx. ? ?  ?Pulmonary ?Current Smoker and Patient abstained from smoking.,  ?  ?Pulmonary exam normal ?breath sounds clear to auscultation ? ? ? ? ? ? Cardiovascular ?hypertension, Pt. on medications ?Normal cardiovascular exam ?Rhythm:Regular Rate:Normal ? ? ?  ?Neuro/Psych ?CVA, Residual Symptoms   ? GI/Hepatic ?  ?Endo/Other  ? ? Renal/GU ?  ? ?  ?Musculoskeletal ? ? Abdominal ?  ?Peds ? Hematology ?  ?Anesthesia Other Findings ? ? Reproductive/Obstetrics ? ?  ? ? ? ? ? ? ? ? ? ? ? ? ? ?  ?  ? ? ? ? ? ? ? ? ?Anesthesia Physical ?Anesthesia Plan ? ?ASA: 4 and emergent ? ?Anesthesia Plan: General  ? ?Post-op Pain Management:   ? ?Induction: Intravenous, Rapid sequence and Cricoid pressure planned ? ?PONV Risk Score and Plan: 2 and Ondansetron, Midazolam and Treatment may vary due to age or medical condition ? ?Airway Management Planned: Oral ETT ? ?Additional Equipment:  ? ?Intra-op Plan:  ? ?Post-operative Plan: Extubation in OR and Possible Post-op intubation/ventilation ? ?Informed Consent: I have reviewed the patients History and Physical, chart, labs and discussed the procedure including the risks, benefits and alternatives for the proposed anesthesia with the patient or authorized representative who has indicated his/her understanding and acceptance.  ? ? ? ?Dental advisory given ? ?Plan Discussed with: CRNA ? ?Anesthesia Plan Comments:   ? ? ? ? ? ? ?Anesthesia Quick Evaluation ? ?

## 2021-09-22 NOTE — H&P (Signed)
Stroke Neurology H&P Note ? ?The history was obtained from the EMS and niece.  During history and examination, all items were able to obtain unless otherwise noted. ? ?History of Present Illness:  EMREE SHARAF is a 81 y.o. Caucasian female with PMH of HTN, PHTN, carotid stenosis bilaterally presented to ED for code stroke ? ?Per EMS and niece Tammy, patient last seen well 9 PM yesterday when she talked to niece Tammy over the phone.  This morning around 11 AM, Tammy received a phone call from patient but Tammy can tell the patient wanted to speak but not able to, mumbling words.  EMS was called.  On arrival, patient was lying on the ground, right facial droop, right-sided weakness and aphasia not able to speak.  BP 140s, glucose 119.  CT head no acute abnormality. ? ?Per family, patient at baseline independent, living by herself, able to cook, clean, shopping and driving by herself.  Walking without any device.  She is on BP meds at home, not on blood thinners. ? ?CTA neck showed left M1 occlusion, left carotid bulb atherosclerosis but no significant stenosis.  CTP 30/70 cc.  Discussed with niece Lynelle Smoke and nephew about benefit and risk of endovascular treatment for acute stroke, they consented to proceed.  Dr. Estanislado Pandy on board and pt sent to IR suite. ? ?LSN: 9 PM yesterday ?tPA Given: No: Outside window ?IR: Yes ?mRS = 0 ? ?Past Medical History:  ?Diagnosis Date  ? Carotid artery stenosis   ? HTN (hypertension) 01/23/2019  ? Hyperlipidemia   ? ? ?Past Surgical History:  ?Procedure Laterality Date  ? BACK SURGERY    ? 1977  ? CATARACT EXTRACTION, BILATERAL    ? 2019, 2016  ? Hystertectomy  1983  ? ? ?Family History  ?Problem Relation Age of Onset  ? Heart attack Mother   ? Heart attack Father   ? Heart attack Brother   ? Suicidality Sister   ? Liver cancer Brother   ? Kidney disease Brother   ? ? ?Social History:  reports that she has been smoking cigarettes. She has been smoking an average of .25 packs per  day. She has never used smokeless tobacco. She reports that she does not drink alcohol and does not use drugs. ? ?Allergies:  ?Allergies  ?Allergen Reactions  ? Neosporin Plus Max St Rash  ? ? ?No current facility-administered medications on file prior to encounter.  ? ?Current Outpatient Medications on File Prior to Encounter  ?Medication Sig Dispense Refill  ? amLODipine (NORVASC) 10 MG tablet TAKE 1 TABLET BY MOUTH ONCE DAILY. PLEASE SCHEDULE APPOINTMENT 90 tablet 0  ? Cholecalciferol (VITAMIN D3) 50 MCG (2000 UT) TABS Take by mouth daily.    ? furosemide (LASIX) 20 MG tablet Take 1 tablet by mouth once daily 90 tablet 0  ? meclizine (ANTIVERT) 12.5 MG tablet Take 12.5 mg by mouth every 8 (eight) hours as needed.    ? olmesartan (BENICAR) 5 MG tablet Take by mouth daily.    ? ? ?Review of Systems: A full ROS was attempted today and was able to be performed.  Systems assessed include - Constitutional, Eyes, HENT, Respiratory, Cardiovascular, Gastrointestinal, Genitourinary, Integument/breast, Hematologic/lymphatic, Musculoskeletal, Neurological, Behavioral/Psych, Endocrine, Allergic/Immunologic - with pertinent responses as per HPI. ? ?Physical Examination: ?Weight:  [69 kg] 69 kg (03/17 1200) ? ?General - well nourished, well developed, in no apparent distress.   ? ?Ophthalmologic - fundi not visualized due to noncooperation.   ? ?Cardiovascular -  regular rhythm and rate ? ?Neuro - awake, alert, eyes open, expressive aphasia, mute without language output, however able to follow most simple commands.  Not able to name and repeat. No gaze palsy but left gaze preference, able to track bilaterally, blinking to visual threat on the left but not on the right.  Right facial droop. Tongue protrusion not corporative.  Left upper and lower extremity no drift, right upper and right lower extremity drift to bed beyond 10/5 seconds.  Sensation diminished on the right upper extremity but decreased on the right lower extremity,  FTN not cooperative, gait not tested.  ? ?NIH Stroke Scale ? ?Level Of Consciousness 0=Alert; keenly responsive ?1=Arouse to minor stimulation ?2=Requires repeated stimulation to arouse or movements to pain ?3=postures or unresponsive 0  ?LOC Questions to Month and Age 82=Answers both questions correctly ?1=Answers one question correctly or dysarthria/intubated/trauma/language barrier ?2=Answers neither question correctly or aphasia 2  ?LOC Commands  ?    -Open/Close eyes ?    -Open/close grip ?    -Pantomime commands if communication barrier 0=Performs both tasks correctly ?1=Performs one task correctly ?2=Performs neighter task correctly 0  ?Best Gaze ?    -Only assess horizontal gaze 0=Normal ?1=Partial gaze palsy ?2=Forced deviation, or total gaze paresis 1  ?Visual 0=No visual loss ?1=Partial hemianopia ?2=Complete hemianopia ?3=Bilateral hemianopia (blind including cortical blindness) 2  ?Facial Palsy ?    -Use grimace if obtunded 0=Normal symmetrical movement ?1=Minor paralysis (asymmetry) ?2=Partial paralysis (lower face) ?3=Complete paralysis (upper and lower face) 2  ?Motor ? 0=No drift for 10/5 seconds ?1=Drift, but does not hit bed ?2=Some antigravity effort, hits  bed ?3=No effort against gravity, limb falls ?4=No movement ?0=Amputation/joint fusion Right Arm 1  ?   Leg 1  ?  Left Arm 0  ?   Leg 0  ?Limb Ataxia ?    - FNT/HTS 0=Absent or does not understand or paralyzed or amputation/joint fusion ?1=Present in one limb ?2=Present in two limbs 0  ?Sensory 0=Normal ?1=Mild to moderate sensory loss ?2=Severe to total sensory loss or coma/unresponsive 1  ?Best Language 0=No aphasia, normal ?1=Mild to moderate aphasia ?2=Severe aphasia ?3=Mute, global aphasia, or coma/unresponsive 3  ?Dysarthria 0=Normal ?1=Mild to moderate ?2=Severe, unintelligible or mute/anarthric ?0=intubated/unable to test 2  ?Extinction/Neglect 0=No abnormality ?1=visual/tactile/auditory/spatia/personal inattention/Extinction to  bilateral simultaneous stimulation ?2=Profound neglect/extinction more than 1 modality  1  ?Total ?  16  ? ? ? ? ?Data Reviewed: ?CT HEAD CODE STROKE WO CONTRAST ? ?Result Date: 09/22/2021 ?CLINICAL DATA:  Code stroke. Neuro deficit, acute, stroke suspected; Stroke, follow up EXAM: CT HEAD WITHOUT CONTRAST CT ANGIOGRAPHY HEAD AND NECK CT PERFUSION BRAIN TECHNIQUE: Multidetector CT imaging of the head using the standard protocol without contrast. Multidetector CT imaging of the head and neck was performed using the standard protocol during bolus administration of intravenous contrast. Multiplanar CT image reconstructions and MIPs were obtained to evaluate the vascular anatomy. Carotid stenosis measurements (when applicable) are obtained utilizing NASCET criteria, using the distal internal carotid diameter as the denominator. Multiphase CT imaging of the brain was performed following IV bolus contrast injection. Subsequent parametric perfusion maps were calculated using RAPID software. RADIATION DOSE REDUCTION: This exam was performed according to the departmental dose-optimization program which includes automated exposure control, adjustment of the mA and/or kV according to patient size and/or use of iterative reconstruction technique. CONTRAST:  190mL OMNIPAQUE IOHEXOL 350 MG/ML SOLN COMPARISON:  None. FINDINGS: CT HEAD Brain: There is no acute intracranial hemorrhage, mass effect,  or edema. Gray-white differentiation is preserved. There is no extra-axial fluid collection. Ventricles and sulci are within normal limits in size and configuration. Patchy low-density in the supratentorial white matter is nonspecific but may reflect mild chronic microvascular ischemic changes. 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 Vascular: No hyperdense vessel. Skull: Calvarium is unremarkable. Sinuses/Orbits: No acute finding. Other: None. Review of the MIP images confirms the above findings CTA NECK Aortic  arch: Mixed plaque along the arch and patent great vessel origins. Calcified plaque along the left subclavian origin likely causes at least 50% stenosis with suboptimal evaluation due to artifact. Right carot

## 2021-09-22 NOTE — Progress Notes (Signed)
?  Echocardiogram ?2D Echocardiogram has been performed. ? ?Mary Ortiz ?09/22/2021, 3:08 PM ?

## 2021-09-22 NOTE — Progress Notes (Signed)
?  Transition of Care (TOC) Screening Note ? ? ?Patient Details  ?Name: Mary Ortiz ?Date of Birth: [birth year] ? ? ?Transition of Care (TOC) CM/SW Contact:    ?Mearl Latin, LCSW ?Phone Number: ?09/22/2021, 5:47 PM ? ? ? ?Transition of Care Department [facility]) has reviewed patient and no TOC needs have been identified at this time. We will continue to monitor patient advancement through interdisciplinary progression rounds. If new patient transition needs arise, please place a TOC consult. ? ? ?

## 2021-09-22 NOTE — ED Provider Notes (Signed)
?[facility] EMERGENCY DEPARTMENT ?Provider Note ? ? ?CSN: [id number] ?Arrival date & time: 09/22/21  1159 ? ?  ? ?History ? ?No chief complaint on file. ? ? ?RENAYE JANICKI is a 81 y.o. female with past medical history of hypertension, carotid artery stenosis, hyperlipidemia.  Presents the emergency department with a chief complaint of strokelike symptoms. ? ?Information is obtained from EMS.  EMS reports that patient has right facial deficits, decree sensation to the right side of her body, and gaze deviation to the left.  Patient is unable to speak at this time.  Patient's daughter reports that she spoke with the patient on the phone yesterday evening at 9 PM.  Patient's reports that her mother called at 76 AM however could not speak to her on the phone.  Patient's daughter called 911 ? ?Level 5 caveat applies due  to patient's acuity of condition. ? ?HPI ? ?  ? ?Home Medications ?Prior to Admission medications   ?Medication Sig Start Date End Date Taking? Authorizing Provider  ?amLODipine (NORVASC) 10 MG tablet TAKE 1 TABLET BY MOUTH ONCE DAILY. PLEASE SCHEDULE APPOINTMENT 02/09/20   Tessa Lerner, DO  ?Cholecalciferol (VITAMIN D3) 50 MCG (2000 UT) TABS Take by mouth daily.    [provider]  ?furosemide (LASIX) 20 MG tablet Take 1 tablet by mouth once daily 08/24/21   Tolia, Sunit, DO  ?meclizine (ANTIVERT) 12.5 MG tablet Take 12.5 mg by mouth every 8 (eight) hours as needed. 09/03/19   [provider]  ?olmesartan (BENICAR) 5 MG tablet Take by mouth daily.    [provider]  ?   ? ?Allergies    ?Neosporin plus max st   ? ?Review of Systems   ?Review of Systems  ?Unable to perform ROS: Acuity of condition  ? ?Physical Exam ?Updated Vital Signs ?There were no vitals taken for this visit.   ?Physical Exam ?Vitals and nursing note reviewed.  ?Constitutional:   ?   General: She is not in acute distress. ?   Appearance: She is not ill-appearing, toxic-appearing or  diaphoretic.  ?HENT:  ?   Head: Normocephalic.  ?Eyes:  ?   General: No scleral icterus.    ?   Right eye: No discharge.     ?   Left eye: No discharge.  ?Cardiovascular:  ?   Rate and Rhythm: Normal rate.  ?Pulmonary:  ?   Effort: Pulmonary effort is normal.  ?   Comments: Airway patent ?Skin: ?   General: Skin is warm and dry.  ?Neurological:  ?   General: No focal deficit present.  ?   Mental Status: She is alert.  ?   Comments: Right-sided facial droop  ?Psychiatric:     ?   Behavior: Behavior is cooperative.  ? ? ?ED Results / Procedures / Treatments   ?Labs ?(all labs ordered are listed, but only abnormal results are displayed) ?Labs Reviewed  ?CBC - Abnormal; Notable for the following components:  ?    Result Value  ? WBC 13.4 (*)   ? RBC 3.78 (*)   ? All other components within normal limits  ?DIFFERENTIAL - Abnormal; Notable for the following components:  ? Neutro Abs 11.6 (*)   ? All other components within normal limits  ?I-STAT CHEM 8, ED - Abnormal; Notable for the following components:  ? Creatinine, Ser 1.40 (*)   ? Glucose, Bld 112 (*)   ? Calcium, Ion 1.11 (*)   ? All other  components within normal limits  ?CBG MONITORING, ED - Abnormal; Notable for the following components:  ? Glucose-Capillary 115 (*)   ? All other components within normal limits  ?PROTIME-INR  ?APTT  ?COMPREHENSIVE METABOLIC PANEL  ? ? ?EKG ?None ? ?Radiology ?No results found. ? ?Procedures ?Marland KitchenCritical Care ?Performed by: Haskel Schroeder, PA-C ?Authorized by: Haskel Schroeder, PA-C  ? ?Critical care provider statement:  ?  Critical care time (minutes):  30 ?  Critical care was necessary to treat or prevent imminent or life-threatening deterioration of the following conditions:  CNS failure or compromise ?  Critical care was time spent personally by me on the following activities:  Development of treatment plan with patient or surrogate, discussions with consultants, evaluation of patient's response to treatment,  examination of patient, ordering and review of laboratory studies, ordering and review of radiographic studies, ordering and performing treatments and interventions, pulse oximetry, re-evaluation of patient's condition and review of old charts  ? ? ?Medications Ordered in ED ?Medications  ?sodium chloride flush (NS) 0.9 % injection 3 mL (has no administration in time range)  ? ? ?ED Course/ Medical Decision Making/ A&P ?  ?                        ?Medical Decision Making ?Amount and/or Complexity of Data Reviewed ?Labs: ordered. ?Radiology: ordered. ? ?Risk ?Decision regarding hospitalization. ? ? ?Alert 81 year old female with obvious right-sided facial droop.  Patient's airway is patent. ? ?Information was obtained from EMS.  Past medical records were reviewed.  Patient has medical history as outlined in HPI which complicates her care. ? ?Level 5 caveat applies due to patient's acuity of condition. ? ?Unable to fully assess the patient as she was moved straight to CT imaging. ? ?I spoke with neurologist Dr. Roda Shutters about patient's condition. ? 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  Neurology advised they will take the patient straight to IR ? ? ? ? ? ? ? ?Final Clinical Impression(s) / ED Diagnoses ?Final diagnoses:  ?None  ? ? ?Rx / DC Orders ?ED Discharge Orders   ? ? None  ? ?  ? ? ?  ?Haskel Schroeder, PA-C ?09/22/21 1252 ? ?  ?Sloan Leiter, DO ?09/23/21 0710 ? ?

## 2021-09-22 NOTE — Procedures (Signed)
Extubation Procedure Note ? ?Patient Details:   ?Name: Mary Ortiz ?DOB: 1941-02-02 ?MRN: [id number] ?  ?Airway Documentation:  ?  ?Vent end date: 09/22/21 Vent end time: 1625  ? ?Evaluation ? O2 sats: stable throughout ?Complications: No apparent complications ?Patient did tolerate procedure well. ?Bilateral Breath Sounds: Clear ?  ?Yes ? ?Patient was extubated with RN & Dr. Judeth Horn at the bedside without any complications, dyspnea or stridor noted. Dr. Judeth Horn was made aware that patient has to lie flat until 20:00. Verbal orders were given to sit patient up to 30 degrees for extubation & to lie patient flat until 20:00.  Positive cuff leak noted prior to extubation.  ? ?[name] ?09/22/2021, 4:25 PM ? ?

## 2021-09-22 NOTE — Progress Notes (Signed)
Patient ID: Mary Ortiz, female   DOB: [birth year], 81 y.o.   MRN: [id number] ?INR. ? ?81 year old right-handed female.  Modified Rankin score 0.  Last seen normal at 9 PM yesterday. ? ?New onset of right facial droop right-sided weakness and aphasia. ? ?CT of the brain negative for intracranial hemorrhage.  CTA reveals left middle cerebral M1 occlusion. ? ?CT perfusion maps revealed: 30 mL.  Mismatch of 40 mL.  Option of endovascular revascularization discussed with the patient is a family.  Procedure, reasons, alternatives, and risks discussed. ? ?Risk of" hemorrhage of 10%, worsening neurological condition, death, and inability revascularize reviewed.  Help family expressed understanding and provided consent to proceed with the treatment. ? ?Fatima Sanger MD ? ? ? ?

## 2021-09-22 NOTE — Progress Notes (Addendum)
Dr. Judeth Horn was made aware of ABG results.  ?

## 2021-09-22 NOTE — Progress Notes (Signed)
An USGPIV (ultrasound guided PIV) has been placed for short-term vasopressor infusion. A correctly placed ivWatch must be used when administering Vasopressors. Should this treatment be needed beyond 72 hours, central line access should be obtained.  It will be the responsibility of the bedside nurse to follow best practice to prevent extravasations.   ?

## 2021-09-22 NOTE — Progress Notes (Signed)
Patient was transported from IR to 4N28 without any complications.  ?

## 2021-09-22 NOTE — Anesthesia Procedure Notes (Signed)
Procedure Name: Intubation ?Date/Time: 09/22/2021 12:35 PM ?Performed by: Harden Mo, CRNA ?Pre-anesthesia Checklist: Patient identified, Emergency Drugs available, Suction available and Patient being monitored ?Patient Re-evaluated:Patient Re-evaluated prior to induction ?Oxygen Delivery Method: Circle System Utilized ?Preoxygenation: Pre-oxygenation with 100% oxygen ?Induction Type: IV induction, Rapid sequence and Cricoid Pressure applied ?Laryngoscope Size: Sabra Heck and 2 ?Grade View: Grade I ?Tube type: Oral ?Tube size: 7.5 mm ?Number of attempts: 1 ?Airway Equipment and Method: Stylet and Oral airway ?Placement Confirmation: ETT inserted through vocal cords under direct vision, positive ETCO2 and breath sounds checked- equal and bilateral ?Secured at: 21 cm ?Tube secured with: Tape ?Dental Injury: Teeth and Oropharynx as per pre-operative assessment  ? ? ? ? ?

## 2021-09-22 NOTE — Consult Note (Signed)
? ?NAME:  Mary Ortiz, MRN:  [id number], DOB:  02-02-1941, LOS: 0 ?ADMISSION DATE:  09/22/2021, CONSULTATION DATE:  09/22/21 ?REFERRING MD:  Deveshwar - NIR, CHIEF COMPLAINT:  mumbling words / stroke   ? ?History of Present Illness:  ?81 yo f PMH bilateral carotid stenosis, HTN, HLD presented to ED 3/17 as code stroke. LKN 3/16 2100. Today (3/17) approx 1100 pt made a phone call to niece but was mumbling. On EMS arrival R facial droop, R sided weakness, aphasic. In ED, CT without acute abnormality. CTA neck revealed L M1 occlusion, L carotid bulb atherosclerosis. Pt then underwent L MCA thrombectomy with NIR.  ? ?Following case, patient has remained intubated.  ? ?PCCM is consulted in this setting  ? ?Pertinent  Medical History  ?Bilateral carotid stenosis ?HTN ?HLD ? ?Significant Hospital Events: ?Including procedures, antibiotic start and stop dates in addition to other pertinent events   ?3/17 admit to neuro for code stroke : L M1 occlusion. Underwent L MCA thrombectomy. Remained intubated. PCCM consulted  ? ?Interim History / Subjective:  ?Remained intubated after case  ? ?Objective   ?Blood pressure (!) 137/55, pulse 60, resp. rate (!) 21, height 5\' 2"  (1.575 m), weight 69 kg, SpO2 100 %. ?   ?Vent Mode: PRVC ?FiO2 (%):  [100 %] 100 % ?Set Rate:  [18 bmp] 18 bmp ?Vt Set:  [400 mL-460 mL] 400 mL ?PEEP:  [5 cmH20] 5 cmH20 ?Plateau Pressure:  [18 cmH20] 18 cmH20  ? ?Intake/Output Summary (Last 24 hours) at 09/22/2021 1418 ?Last data filed at 09/22/2021 1350 ?Gross per 24 hour  ?Intake 1550 ml  ?Output 60 ml  ?Net 1490 ml  ? ?Filed Weights  ? 09/22/21 1200  ?Weight: 69 kg  ? ? ?Examination: ?General: critically ill elderly F intubated sedated  ?HENT: NCAT ETT secure anicteric sclera ?Lungs: CTAb even unlabored. Mechanically ventilated  ?Cardiovascular: bradycardic rate, reg rhythm. S1s2 cap refill brisk  ?Abdomen: soft nd + bowel sounds  ?Extremities: no acute joint deformity. No cyanosis or clubbing  ?Neuro:  Sedated. Pupils 53mm. No response to pain. No respiratory effort.  ?GU: foley ? ?Resolved Hospital Problem list   ? ? ?Assessment & Plan:  ? 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 Goal RASS  0  ? ?Expected post procedural ventilator management ?ETT present ?P ?-CXR, ABG ?-Cont MV support wean to extubate as sedation is weaned  ?-VAP, pulm hygiene  ? ?Hx HTN ?P ?-SBP goal 120-140 as above  ?-defer restarting home meds 3/17  ? ?AKI vs CKD ?P ?-gently IVF ?-trend renal indices and UOP  ? ? ? ?Best Practice (right click and "Reselect all SmartList Selections" daily)  ? ?Diet/type: NPO ?DVT prophylaxis: SCD ?GI prophylaxis: PPI ?Lines: N/A ?Foley:  Yes, and it is still needed ?Code Status:  full code ?Last date of multidisciplinary goals of care discussion [per primary ] ? ?Labs   ?CBC: ?Recent Labs  ?Lab 09/22/21 ?1201 09/22/21 ?1208  ?WBC 13.4*  --   ?NEUTROABS 11.6*  --   ?HGB 12.5 12.9  ?HCT 36.2 38.0  ?MCV 95.8  --   ?PLT 193  --   ? ? ?Basic Metabolic Panel: ?Recent Labs  ?Lab 09/22/21 ?1201 09/22/21 ?1208  ?NA 138 138  ?K 4.4 4.4  ?CL  107 106  ?CO2 21*  --   ?GLUCOSE 116* 112*  ?BUN 20 23  ?CREATININE 1.47* 1.40*  ?CALCIUM 9.1  --   ? ?GFR: ?Estimated Creatinine Clearance: 28.2 mL/min (A) (by C-G formula based on SCr of 1.4 mg/dL (H)). ?Recent Labs  ?Lab 09/22/21 ?1201  ?WBC 13.4*  ? ? ?Liver Function Tests: ?Recent Labs  ?Lab 09/22/21 ?1201  ?AST 15  ?ALT 14  ?ALKPHOS 56  ?BILITOT 0.5  ?PROT 6.7  ?ALBUMIN 3.8  ? ?No results for input(s): LIPASE, AMYLASE in the last 168 hours. ?No results for input(s): AMMONIA in the last 168 hours. ? ?ABG ?   ?Component Value Date/Time  ? TCO2 23 09/22/2021 1208  ?  ? ?Coagulation Profile: ?Recent Labs  ?Lab 09/22/21 ?1201  ?INR 1.0  ? ? ?Cardiac Enzymes: ?No results for input(s):  CKTOTAL, CKMB, CKMBINDEX, TROPONINI in the last 168 hours. ? ?HbA1C: ?No results found for: HGBA1C ? ?CBG: ?Recent Labs  ?Lab 09/22/21 ?1202  ?GLUCAP 115*  ? ? ?Review of Systems:   ?Unable to obtain, intubated sedated ? ?Past Medical History:  ?She,  has a past medical history of Carotid artery stenosis, HTN (hypertension) (01/23/2019), and Hyperlipidemia.  ? ?Surgical History:  ? ?Past Surgical History:  ?Procedure Laterality Date  ? BACK SURGERY    ? 1977  ? CATARACT EXTRACTION, BILATERAL    ? 2019, 2016  ? Hystertectomy  1983  ?  ? ?Social History:  ? reports that she has been smoking cigarettes. She has been smoking an average of .25 packs per day. She has never used smokeless tobacco. She reports that she does not drink alcohol and does not use drugs.  ? ?Family History:  ?Her family history includes Heart attack in her brother, father, and mother; Kidney disease in her brother; Liver cancer in her brother; Suicidality in her sister.  ? ?Allergies ?Allergies  ?Allergen Reactions  ? Neosporin Plus Max St Rash  ?  ? ?Home Medications  ?Prior to Admission medications   ?Medication Sig Start Date End Date Taking? Authorizing Provider  ?amLODipine (NORVASC) 10 MG tablet TAKE 1 TABLET BY MOUTH ONCE DAILY. PLEASE SCHEDULE APPOINTMENT 02/09/20   Tessa Lerner, DO  ?Cholecalciferol (VITAMIN D3) 50 MCG (2000 UT) TABS Take by mouth daily.    [provider]  ?furosemide (LASIX) 20 MG tablet Take 1 tablet by mouth once daily 08/24/21   Tolia, Sunit, DO  ?meclizine (ANTIVERT) 12.5 MG tablet Take 12.5 mg by mouth every 8 (eight) hours as needed. 09/03/19   [provider]  ?olmesartan (BENICAR) 5 MG tablet Take by mouth daily.    [provider]  ?  ? ?Critical care time: 35 minutes  ?  ? ? ?CRITICAL CARE ?Performed by: Lanier Clam ? ? ?Total critical care time: 35 minutes ? ?Critical care time was exclusive of separately billable procedures and treating other patients. ?Critical care was necessary  to treat or prevent imminent or life-threatening deterioration. ? ?Critical care was time spent personally by me on the following activities: development of treatment plan with patient and/or surrogate as well as nursing, discussions with consultants, evaluation of patient's response to treatment, examination of patient, obtaining history from patient or surrogate, ordering and performing treatments and interventions, ordering and review of laboratory studies, ordering and review of radiographic studies, pulse oximetry and re-evaluation of patient's condition. ? ?Tessie Fass MSN, AGACNP-BC ?[facility] ?Amion for pager  ?09/22/2021, 2:18 PM ? ? ? ?

## 2021-09-22 NOTE — Code Documentation (Signed)
Stroke Response Nurse Documentation ?Code Documentation ? ?Mary Ortiz is a 81 y.o. female arriving to [facility]  via [facility] on 09/22/2021 with past medical hx of HTN, Carotiod artery stenosis, HLP. On No antithrombotic. Code stroke was activated by EMS.  ? ?Patient from home where she was LKW at 2100 last night when she talked to her niece on the phone. This morning at 1100, the niece received a phone call from the patient and the patient was unable to speak. Called 911. EMS noted right sided weakness left gaze preference and inability to speak. Code Stroke in the field. ? ?Stroke team at the bedside on patient arrival. Labs drawn and patient cleared for CT by [facility]. Patient to CT with team. NIHSS 15, see documentation for details and code stroke times. Patient with disoriented, left gaze preference , right hemianopia, right facial droop, right arm weakness, right leg weakness, right decreased sensation, Expressive aphasia , and dysarthria  on exam. The following imaging was completed:  CT Head, CTA, and CTP. Patient is not a candidate for IV Thrombolytic due to being outside window. Patient is not a candidate for IR due to Left MCA occlusion per MD Erlinda Hong.  ? ?Care Plan: Code IR paged. Patient taken to IR Suite. Report given to Marya Amsler, Therapist, sports.   ? ? ?Kathrin Greathouse  ?Stroke Response RN ? ? ?

## 2021-09-22 NOTE — Progress Notes (Signed)
SLP Cancellation Note ? ?Patient Details ?Name: Mary Ortiz ?MRN: [id number] ?DOB: 1941-04-09 ? ? ?Cancelled treatment:       Reason Eval/Treat Not Completed: Patient not medically ready ? ? ?Ivor Kishi, Riley Nearing ?09/22/2021, 2:12 PM ?

## 2021-09-22 NOTE — Procedures (Signed)
INR. ?Left common carotid arteriogram. ?Right CFA approach. ? ?Findings.  Occluded left middle cerebral M1 segment. ? ?Status post complete revascularization of occluded left MCA M1 segment with 1 pass with 5 mm x 37 mm embo trap retrieval device and contact aspiration achieving a TICI 3 revascularization. ? ?Post procedure CT of the brain negative for hemorrhage. ? ?A: Medical sheaths are replaced with an 8 French Angio-Seal closure device with hemostasis at the right groin puncture site.  Distal pulses all palpable in both  feet at the end of the procedure. ?Patient left intubated awaiting COVID test. ? ?Patient transferred to neuro ICU for post thrombectomy management. ? ?Mary Sanger MD ? ? ? ?

## 2021-09-23 ENCOUNTER — Inpatient Hospital Stay (HOSPITAL_COMMUNITY): Payer: Medicare Other

## 2021-09-23 DIAGNOSIS — I6602 Occlusion and stenosis of left middle cerebral artery: Secondary | ICD-10-CM

## 2021-09-23 DIAGNOSIS — I10 Essential (primary) hypertension: Secondary | ICD-10-CM | POA: Diagnosis not present

## 2021-09-23 DIAGNOSIS — I6523 Occlusion and stenosis of bilateral carotid arteries: Secondary | ICD-10-CM

## 2021-09-23 DIAGNOSIS — I63512 Cerebral infarction due to unspecified occlusion or stenosis of left middle cerebral artery: Secondary | ICD-10-CM | POA: Diagnosis not present

## 2021-09-23 DIAGNOSIS — I639 Cerebral infarction, unspecified: Secondary | ICD-10-CM

## 2021-09-23 LAB — CBC WITH DIFFERENTIAL/PLATELET
Abs Immature Granulocytes: 0.04 10*3/uL (ref 0.00–0.07)
Basophils Absolute: 0.1 10*3/uL (ref 0.0–0.1)
Basophils Relative: 1 %
Eosinophils Absolute: 0 10*3/uL (ref 0.0–0.5)
Eosinophils Relative: 0 %
HCT: 33.1 % — ABNORMAL LOW (ref 36.0–46.0)
Hemoglobin: 10.7 g/dL — ABNORMAL LOW (ref 12.0–15.0)
Immature Granulocytes: 0 %
Lymphocytes Relative: 18 %
Lymphs Abs: 2 10*3/uL (ref 0.7–4.0)
MCH: 31.8 pg (ref 26.0–34.0)
MCHC: 32.3 g/dL (ref 30.0–36.0)
MCV: 98.5 fL (ref 80.0–100.0)
Monocytes Absolute: 1 10*3/uL (ref 0.1–1.0)
Monocytes Relative: 8 %
Neutro Abs: 8.3 10*3/uL — ABNORMAL HIGH (ref 1.7–7.7)
Neutrophils Relative %: 73 %
Platelets: 222 10*3/uL (ref 150–400)
RBC: 3.36 MIL/uL — ABNORMAL LOW (ref 3.87–5.11)
RDW: 13.6 % (ref 11.5–15.5)
WBC: 11.4 10*3/uL — ABNORMAL HIGH (ref 4.0–10.5)
nRBC: 0 % (ref 0.0–0.2)

## 2021-09-23 LAB — LIPID PANEL
Cholesterol: 113 mg/dL (ref 0–200)
HDL: 38 mg/dL — ABNORMAL LOW (ref >40)
LDL Cholesterol: 50 mg/dL (ref 0–99)
Total CHOL/HDL Ratio: 3 RATIO
Triglycerides: 126 mg/dL (ref <150)
VLDL: 25 mg/dL (ref 0–40)

## 2021-09-23 LAB — HEMOGLOBIN A1C
Hgb A1c MFr Bld: 5.3 % (ref 4.8–5.6)
Mean Plasma Glucose: 105.41 mg/dL

## 2021-09-23 LAB — BASIC METABOLIC PANEL
Anion gap: 8 (ref 5–15)
BUN: 13 mg/dL (ref 8–23)
CO2: 19 mmol/L — ABNORMAL LOW (ref 22–32)
Calcium: 8.2 mg/dL — ABNORMAL LOW (ref 8.9–10.3)
Chloride: 113 mmol/L — ABNORMAL HIGH (ref 98–111)
Creatinine, Ser: 1.21 mg/dL — ABNORMAL HIGH (ref 0.44–1.00)
GFR, Estimated: 45 mL/min — ABNORMAL LOW (ref >60)
Glucose, Bld: 106 mg/dL — ABNORMAL HIGH (ref 70–99)
Potassium: 4.3 mmol/L (ref 3.5–5.1)
Sodium: 140 mmol/L (ref 135–145)

## 2021-09-23 LAB — CORTISOL: Cortisol, Plasma: 18.3 ug/dL

## 2021-09-23 LAB — TRIGLYCERIDES: Triglycerides: 127 mg/dL (ref <150)

## 2021-09-23 IMAGING — CT CT HEAD W/O CM
3 series · 15 of 47 positions shown, 18 images · non-contrast
Comparison: CT head [DATE].

CLINICAL DATA: Stroke, follow up



[Series 4: head 5.0 h30s · axial · 0.43mm/px · z∈[-67,+58]mm · 9 of 30 slices shown, 12 images]
[im 3/30  brain]
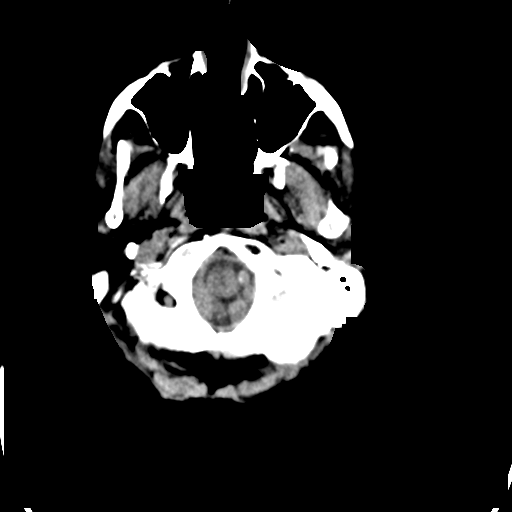
[im 3/30  bone]
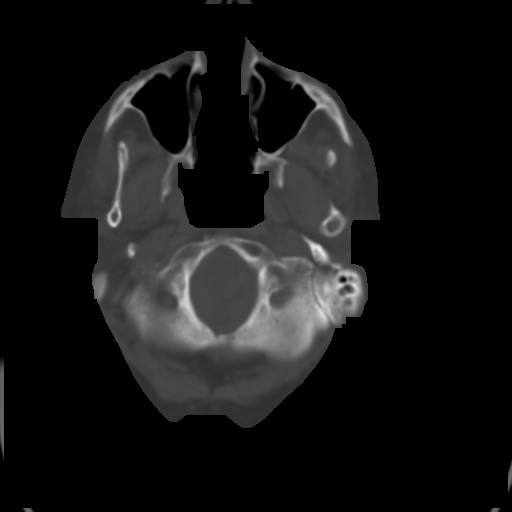
[im 6/30  brain]
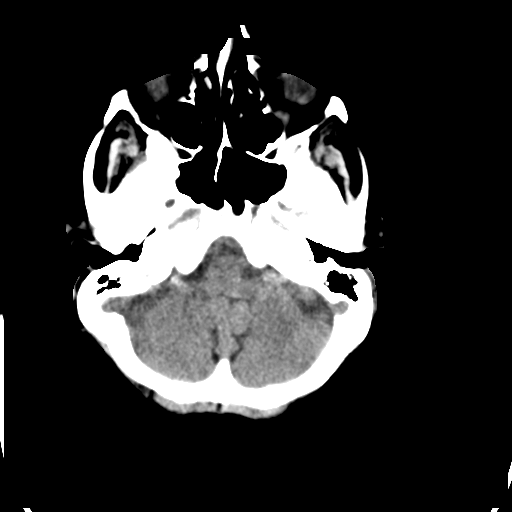
[im 9/30  brain]
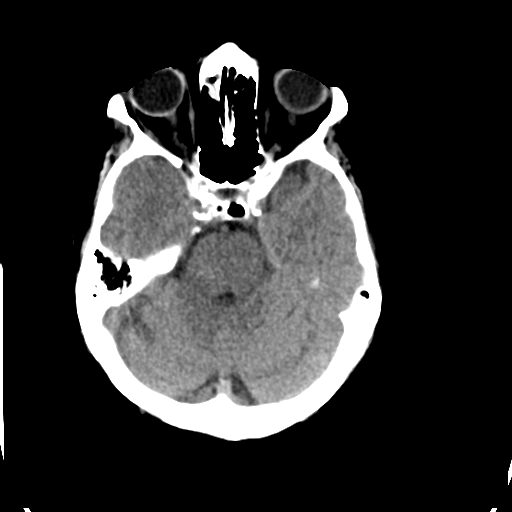
[im 12/30  brain]
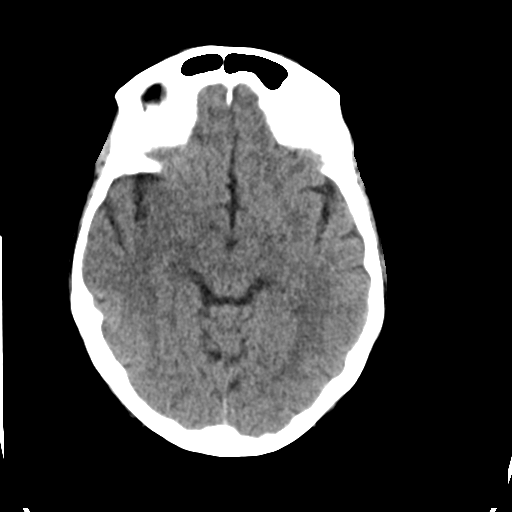
[im 16/30  brain]
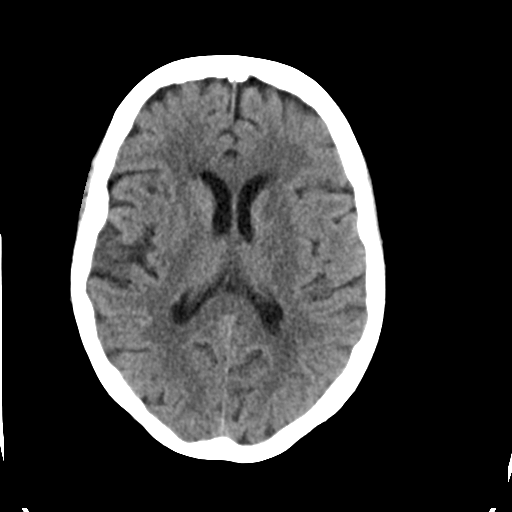
[im 16/30  bone]
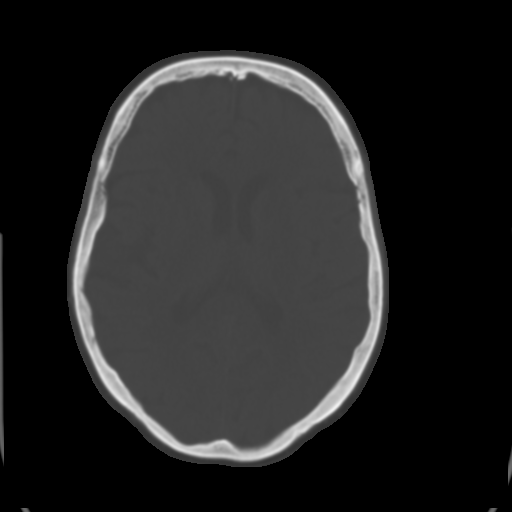
[im 19/30  brain]
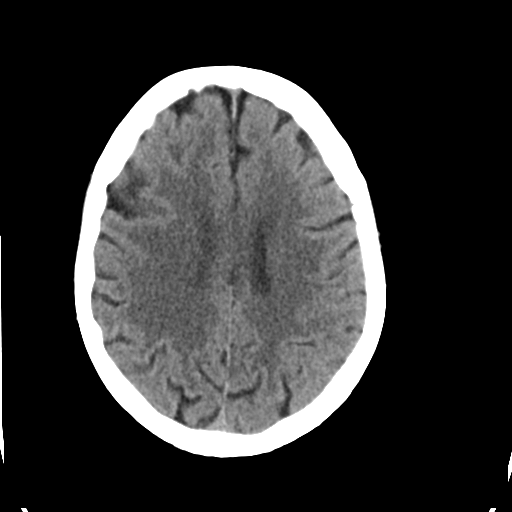
[im 22/30  brain]
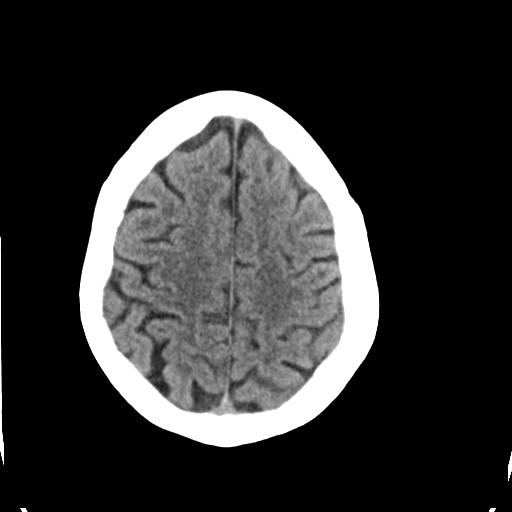
[im 25/30  brain]
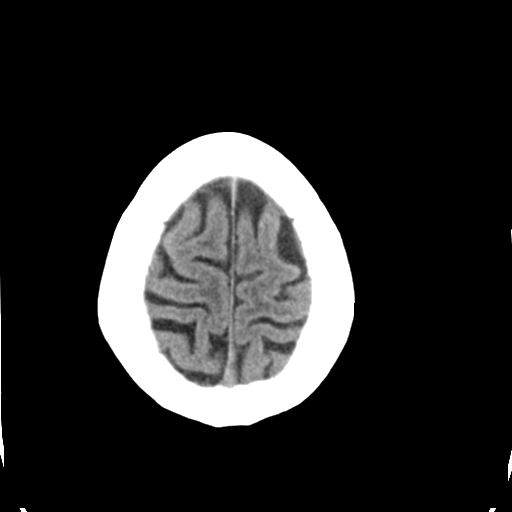
[im 28/30  brain]
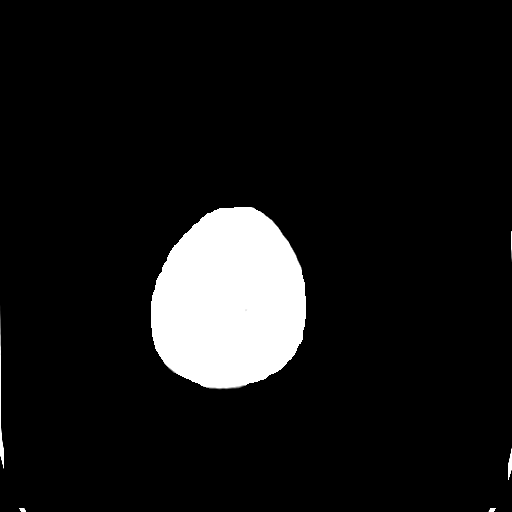
[im 28/30  bone]
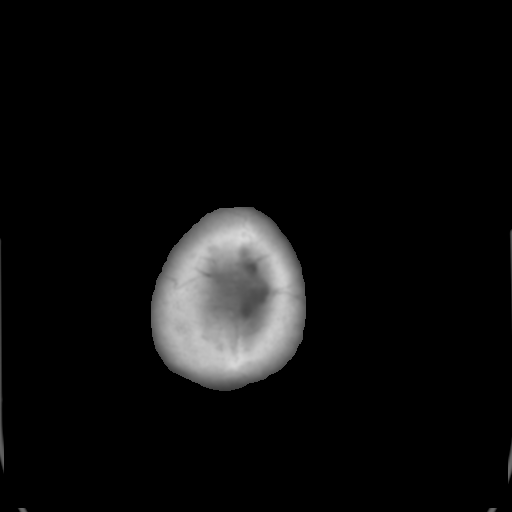

[Series 5: head 3.0 mpr cor · coronal · 0.29mm/px · 3 of 67 slices shown]
[im 23/67  brain]
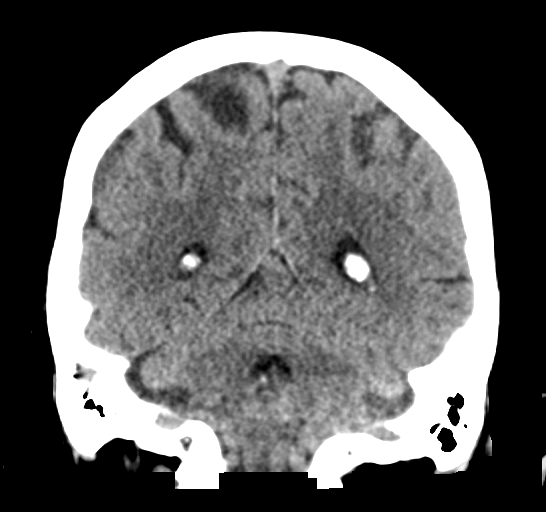
[im 30/67  brain]
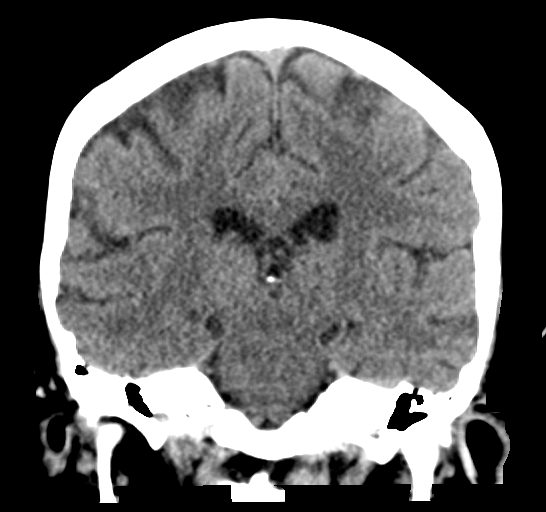
[im 37/67  brain]
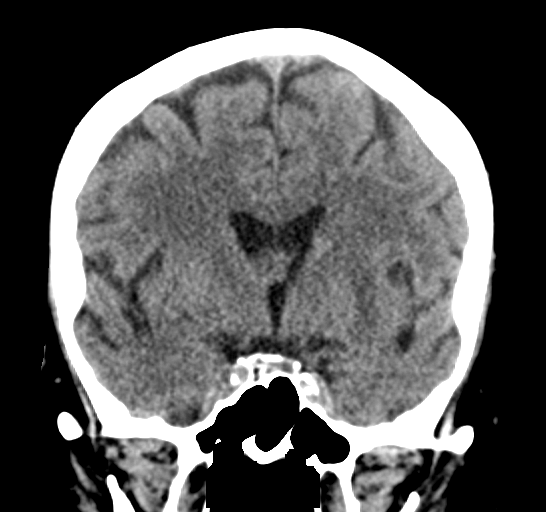

[Series 6: head 3.0 mpr sag · sagittal · 0.30mm/px · 3 of 51 slices shown]
[im 17/51  brain]
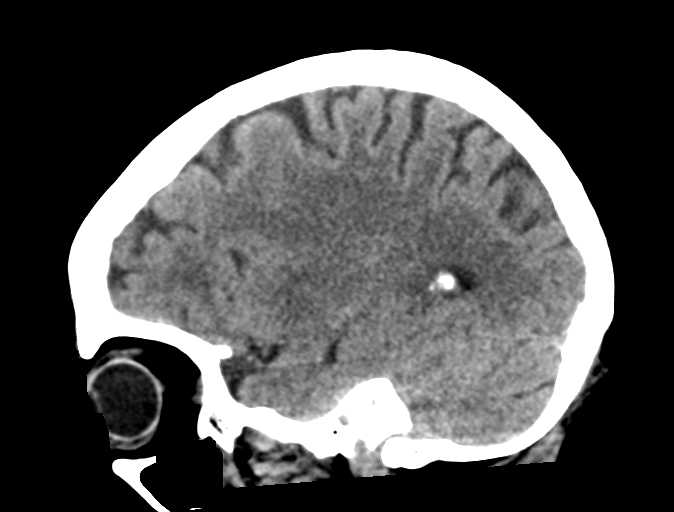
[im 26/51  brain]
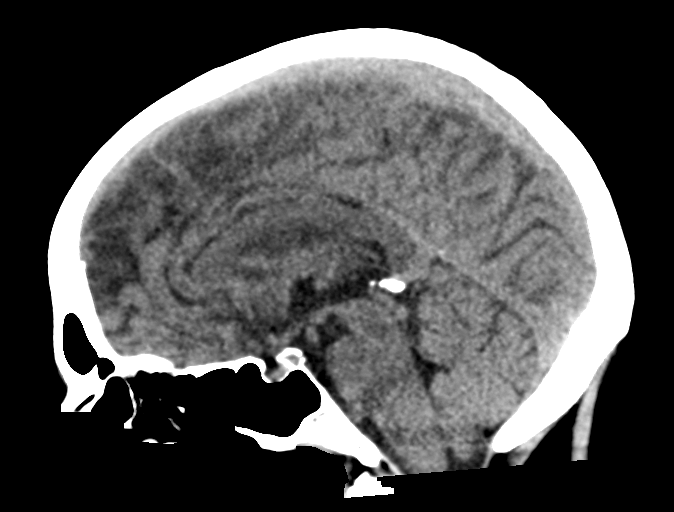
[im 34/51  brain]
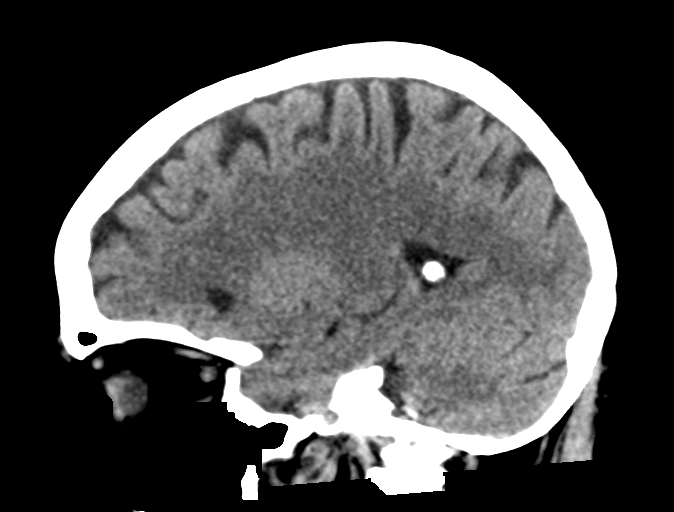

[15 of 47 positions shown; findings below may reference images not displayed]

FINDINGS: Brain: No evidence of acute large vascular territory infarction,
hydrocephalus, extra-axial collection or mass lesion/mass
effect.Small focus of hyperdensity in the left sylvian fissure (for
example see series 4, image 13 and series 5, image 32). Patchy white
matter hypoattenuation, nonspecific but of chronic microvascular
ischemic disease. Similar chronic infarct in the right frontal white
matter.

Vascular: No hyperdense vessel identified.

Skull: No acute fracture.

Sinuses/Orbits: Mild paranasal sinus mucosal thickening. No acute
orbital findings.

Other: No mastoid effusions.
IMPRESSION: 1. Small focus of hyperdensity in the left sylvian fissure, which
could represent small volume of subarachnoid hemorrhage versus
contrast staining from recent catheter arteriogram. Recommend short
interval follow-up CT to exclude progressive hemorrhage.
2. No clear evidence of acute large vascular territory infarct. MRI
could provide more sensitive evaluation for acute infarct if the
patient is able.

These results will be called to the ordering clinician or
representative by the Radiologist Assistant, and communication
documented in the PACS or [REDACTED].

## 2021-09-23 MED ORDER — CHLORHEXIDINE GLUCONATE CLOTH 2 % EX PADS
6.0000 | MEDICATED_PAD | Freq: Every day | CUTANEOUS | Status: DC
  Administered 2021-09-23 – 2021-09-24 (×2): 6 via TOPICAL

## 2021-09-23 MED ORDER — NITROGLYCERIN 2 % TD OINT
1.0000 [in_us] | TOPICAL_OINTMENT | Freq: Three times a day (TID) | TRANSDERMAL | Status: DC
  Administered 2021-09-23 – 2021-09-24 (×2): 1 [in_us] via TOPICAL
  Filled 2021-09-23: qty 30

## 2021-09-23 MED ORDER — PHENTOLAMINE MESYLATE 5 MG IJ SOLR
5.0000 mg | Freq: Once | INTRAMUSCULAR | Status: AC
  Administered 2021-09-23: 5 mg via SUBCUTANEOUS
  Filled 2021-09-23: qty 5

## 2021-09-23 MED ORDER — MIDODRINE HCL 5 MG PO TABS
5.0000 mg | ORAL_TABLET | Freq: Three times a day (TID) | ORAL | Status: DC
  Administered 2021-09-23 – 2021-09-24 (×2): 5 mg via ORAL
  Filled 2021-09-23 (×2): qty 1

## 2021-09-23 MED ORDER — ASPIRIN EC 81 MG PO TBEC
81.0000 mg | DELAYED_RELEASE_TABLET | Freq: Every day | ORAL | Status: DC
  Administered 2021-09-23 – 2021-09-25 (×3): 81 mg via ORAL
  Filled 2021-09-23 (×3): qty 1

## 2021-09-23 MED ORDER — ENOXAPARIN SODIUM 40 MG/0.4ML IJ SOSY: 40.0000 mg | PREFILLED_SYRINGE | INTRAMUSCULAR | Status: DC

## 2021-09-23 MED ORDER — CLOPIDOGREL BISULFATE 75 MG PO TABS
75.0000 mg | ORAL_TABLET | Freq: Every day | ORAL | Status: DC
  Administered 2021-09-23 – 2021-09-25 (×3): 75 mg via ORAL
  Filled 2021-09-23 (×3): qty 1

## 2021-09-23 MED ORDER — PANTOPRAZOLE SODIUM 40 MG PO TBEC
40.0000 mg | DELAYED_RELEASE_TABLET | Freq: Every day | ORAL | Status: DC
Start: 2021-09-23 — End: 2021-09-25
  Administered 2021-09-23 – 2021-09-25 (×3): 40 mg via ORAL
  Filled 2021-09-23 (×3): qty 1

## 2021-09-23 MED ORDER — ENOXAPARIN SODIUM 40 MG/0.4ML IJ SOSY
40.0000 mg | PREFILLED_SYRINGE | INTRAMUSCULAR | Status: DC
  Administered 2021-09-24: 40 mg via SUBCUTANEOUS
  Filled 2021-09-23: qty 0.4

## 2021-09-23 NOTE — Evaluation (Signed)
Physical Therapy Evaluation ?Patient Details ?Name: Mary Ortiz ?MRN: [id number] ?DOB: Jul 13, 1940 ?Today's Date: 09/23/2021 ? ?History of Present Illness ? Pt is 81 yo female who presents with aphasia, R side weakness, found on floor by EMS. CT showed L CVA and pt underwent revascularization 3/17. PMH: HTN, carotid stenosis.  ?Clinical Impression ? Pt admitted with above diagnosis. Pt presents with equal strength bilaterally, previous R sided weakness resolved, however, she exhibited some generalized weakness with mobility, likely from last day in bed. Pt ambulated 30' without AD but had LOB with turning which required min A to correct to prevent fall. Recommend home with HHPT for balance program. Spoke to pt about safety going forward and possibility of life alert system as well as having niece stay with her first few nights at home. Will follow acutely.  Pt currently with functional limitations due to the deficits listed below (see PT Problem List). Pt will benefit from skilled PT to increase their independence and safety with mobility to allow discharge to the venue listed below.   ?   ?   ? ?Recommendations for follow up therapy are one component of a multi-disciplinary discharge planning process, led by the attending physician.  Recommendations may be updated based on patient status, additional functional criteria and insurance authorization. ? ?Follow Up Recommendations Home health PT ? ?  ?Assistance Recommended at Discharge Intermittent Supervision/Assistance  ?Patient can return home with the following ? A little help with walking and/or transfers;Assistance with cooking/housework;Assist for transportation ? ?  ?Equipment Recommendations None recommended by PT  ?Recommendations for Other Services ?    ?  ?Functional Status Assessment Patient has had a recent decline in their functional status and demonstrates the ability to make significant improvements in function in a reasonable and predictable amount of  time.  ? ?  ?Precautions / Restrictions Precautions ?Precautions: Fall ?Precaution Comments: watch BP ?Restrictions ?Weight Bearing Restrictions: No  ? ?  ? ?Mobility ? Bed Mobility ?Overal bed mobility: Needs Assistance ?Bed Mobility: Supine to Sit, Sit to Supine ?  ?  ?Supine to sit: Supervision ?Sit to supine: Min assist ?  ?General bed mobility comments: min A to LE's to lift against gravity with return to bed, otherwise no assist needed ?  ? ?Transfers ?Overall transfer level: Needs assistance ?Equipment used: None ?Transfers: Sit to/from Stand ?Sit to Stand: Min guard ?  ?  ?  ?  ?  ?General transfer comment: min guard for safety, pt denied dizziness ?  ? ?Ambulation/Gait ?Ambulation/Gait assistance: Min assist ?Gait Distance (Feet): 30 Feet ?Assistive device: None ?Gait Pattern/deviations: Step-through pattern, Decreased stride length ?Gait velocity: decreased ?Gait velocity interpretation: 1.31 - 2.62 ft/sec, indicative of limited community ambulator ?  ?General Gait Details: pt reports feeling generally weak, one LOB with turning with min A to correct ? ?Stairs ?  ?  ?  ?  ?  ? ?Wheelchair Mobility ?  ? ?Modified Rankin (Stroke Patients Only) ?Modified Rankin (Stroke Patients Only) ?Pre-Morbid Rankin Score: No symptoms ?Modified Rankin: Moderate disability ? ?  ? ?Balance Overall balance assessment: Needs assistance ?Sitting-balance support: Feet supported ?Sitting balance-Leahy Scale: Good ?  ?  ?Standing balance support: No upper extremity supported ?Standing balance-Leahy Scale: Fair ?Standing balance comment: LOB noted with perturbation ?  ?  ?  ?  ?  ?  ?  ?  ?  ?  ?  ?   ? ? ? ?Pertinent Vitals/Pain Pain Assessment ?Pain Assessment: No/denies pain  ? ? ?  Home Living Family/patient expects to be discharged to:: Private residence ?Living Arrangements: Alone ?Available Help at Discharge: Family;Available PRN/intermittently ?Type of Home: House ?Home Access: Level entry ?  ?  ?  ?Home Layout: One  level ?Home Equipment: None ?Additional Comments: niece can stay with her at night as needed. Sister lives next door but she has Alzheimers and pt helps care for her  ?  ?Prior Function Prior Level of Function : Independent/Modified Independent;Driving ?  ?  ?  ?  ?  ?  ?  ?  ?  ? ? ?Hand Dominance  ? Dominant Hand: Right ? ?  ?Extremity/Trunk Assessment  ? Upper Extremity Assessment ?Upper Extremity Assessment: Defer to OT evaluation ?  ? ?Lower Extremity Assessment ?Lower Extremity Assessment: Generalized weakness (grossly 4-/5 throughout, equal R and L) ?  ? ?Cervical / Trunk Assessment ?Cervical / Trunk Assessment: Normal  ?Communication  ? Communication: No difficulties  ?Cognition Arousal/Alertness: Awake/alert ?Behavior During Therapy: [facility] for tasks assessed/performed ?Overall Cognitive Status: Within Functional Limits for tasks assessed ?  ?  ?  ?  ?  ?  ?  ?  ?  ?  ?  ?  ?  ?  ?  ?  ?  ?  ?  ? ?  ?General Comments General comments (skin integrity, edema, etc.): VSS. ? ?  ?Exercises    ? ?Assessment/Plan  ?  ?PT Assessment Patient needs continued PT services  ?PT Problem List Decreased strength;Decreased activity tolerance;Decreased balance;Decreased mobility ? ?   ?  ?PT Treatment Interventions DME instruction;Gait training;Functional mobility training;Therapeutic activities;Therapeutic exercise;Balance training;Neuromuscular re-education;Patient/family education   ? ?PT Goals (Current goals can be found in the Care Plan section)  ?Acute Rehab PT Goals ?Patient Stated Goal: return home ?PT Goal Formulation: With patient ?Time For Goal Achievement: 10/07/21 ?Potential to Achieve Goals: Good ? ?  ?Frequency Min 4X/week ?  ? ? ?Co-evaluation   ?  ?  ?  ?  ? ? ?  ?AM-PAC PT "6 Clicks" Mobility  ?Outcome Measure Help needed turning from your back to your side while in a flat bed without using bedrails?: None ?Help needed moving from lying on your back to sitting on the side of a flat bed without using bedrails?:  None ?Help needed moving to and from a bed to a chair (including a wheelchair)?: A Little ?Help needed standing up from a chair using your arms (e.g., wheelchair or bedside chair)?: A Little ?Help needed to walk in hospital room?: A Little ?Help needed climbing 3-5 steps with a railing? : A Little ?6 Click Score: 20 ? ?  ?End of Session Equipment Utilized During Treatment: Gait belt ?Activity Tolerance: Patient tolerated treatment well ?Patient left: in bed;with call bell/phone within reach ?Nurse Communication: Mobility status ?PT Visit Diagnosis: Unsteadiness on feet (R26.81) ?  ? ?Time: 1541-1600 ?PT Time Calculation (min) (ACUTE ONLY): 19 min ? ? ?Charges:   PT Evaluation ?$PT Eval Moderate Complexity: 1 Mod ?  ?  ?   ? ? ?Lyanne Co, PT  ?Acute Rehab Services ? Pager [phone] ?Office [phone] ? ? ?Kalifa Cadden L Anora Schwenke ?09/23/2021, 5:05 PM ? ?

## 2021-09-23 NOTE — Progress Notes (Signed)
Brief Neuro Update: ? ?Possible small hemorrhage vs contrast staining in the L sylvian fissure. Will get MRI overnight to clarify and to ensure stability. Hold off on any further anteplatelet treatment at this time. Hold off on DVT prophylaxis. Will keep SBP between 130-150 overnight. ? ? ?Erick Blinks ?[facility] ?Pager Number [phone] ?

## 2021-09-23 NOTE — Progress Notes (Signed)
? ?NAME:  Mary Ortiz, MRN:  [id number], DOB:  1940/08/18, LOS: 1 ?ADMISSION DATE:  09/22/2021, CONSULTATION DATE:  3/17 ?REFERRING MD:  Corliss Skains, CHIEF COMPLAINT:  mumbling words  ? ?History of Present Illness:  ?81 y/o female presented with stroke due to left MCA occlusion requiring thrombectomy on 3/17, intubated for airway protection, PCCM consulted.  ? ?Pertinent  Medical History  ?Bilateral carotid stenosis ?Hypertension ?Hyperlipidemia ? ?Significant Hospital Events: ?Including procedures, antibiotic start and stop dates in addition to other pertinent events   ?3/17 admitted for code stroke, L M1 occlusion, L MCA thrombectomy, extubated; started on neosynephrine for BP control per neuro IR parameters; echo LVEF 60-65%, grade 2 diastolic dysfunction, RV function mildly reduced, RVSP is 42. Both atria are dilated ? ?Interim History / Subjective:  ?Awake, alert, standing and walking with OT when I come into the room ?Remains on phenylephrine to maintain BP in normal range ? ?Objective   ?Blood pressure 136/74, pulse 60, temperature 98.2 ?F (36.8 ?C), temperature source Oral, resp. rate (!) 25, height 5\' 2"  (1.575 m), weight 69 kg, SpO2 94 %. ?   ?Vent Mode: CPAP;PSV ?FiO2 (%):  [40 %-100 %] 40 % ?Set Rate:  [18 bmp] 18 bmp ?Vt Set:  [400 mL-460 mL] 400 mL ?PEEP:  [5 cmH20] 5 cmH20 ?Pressure Support:  [5 cmH20] 5 cmH20 ?Plateau Pressure:  [18 cmH20] 18 cmH20  ? ?Intake/Output Summary (Last 24 hours) at 09/23/2021 1105 ?Last data filed at 09/23/2021 0800 ?Gross per 24 hour  ?Intake 3923.12 ml  ?Output 2110 ml  ?Net 1813.12 ml  ? ?Filed Weights  ? 09/22/21 1200  ?Weight: 69 kg  ? ? ?Examination: ? ?General:  sitting up on side of bed ?HENT: NCAT OP clear ?PULM: CTA B, normal effort ?CV: RRR, no mgr ?GI: BS+, soft, nontender ?MSK: normal bulk and tone ?Neuro: awake, alert, no distress, MAEW ? ? ?Resolved Hospital Problem list   ? ? ?Assessment & Plan:  ?L MCA stroke, s/p thrombectomy > doing well post thrombectomy  on 3/18 ?Continue to monitor neuro exam ?Secondary stroke prevention per neurology/stroke service ?BP goals and glucose goals per stroke service ?Statin, antiplatelet agent per stroke service ?Imaging per stroke service ?PT/OT/SLP ? ?Hypotension requiring neosynephrine:  unclear cause. Not septic, echo shows normal LVEF, some degree of pulmonary hypertension; could this be related to her carotid artery stenosis and recent IR procedure? ?Check random cortisol ?Tele ?Wean off neosynephrine for BP goal as noted by stroke/neuro IR service: SBP 120-140 for today ? ?AKI > improved ?Monitor BMET and UOP ?Replace electrolytes as needed ? ? ?Best Practice (right click and "Reselect all SmartList Selections" daily)  ? ?Diet/type: dysphagia diet (see orders) ?DVT prophylaxis: prophylactic heparin  ?GI prophylaxis: N/A ?Lines: N/A ?Foley:  N/A ?Code Status:  full code ?Last date of multidisciplinary goals of care discussion [per stroke team, I updated daughters bedside on 3/18] ? ?Labs   ?CBC: ?Recent Labs  ?Lab 09/22/21 ?1201 09/22/21 ?1208 09/22/21 ?1543 09/23/21 ?0726  ?WBC 13.4*  --   --  11.4*  ?NEUTROABS 11.6*  --   --  8.3*  ?HGB 12.5 12.9 10.5* 10.7*  ?HCT 36.2 38.0 31.0* 33.1*  ?MCV 95.8  --   --  98.5  ?PLT 193  --   --  222  ? ? ?Basic Metabolic Panel: ?Recent Labs  ?Lab 09/22/21 ?1201 09/22/21 ?1208 09/22/21 ?1543 09/23/21 ?0726  ?NA 138 138 139 140  ?K 4.4 4.4 4.5 4.3  ?CL 107  106  --  113*  ?CO2 21*  --   --  19*  ?GLUCOSE 116* 112*  --  106*  ?BUN 20 23  --  13  ?CREATININE 1.47* 1.40*  --  1.21*  ?CALCIUM 9.1  --   --  8.2*  ? ?GFR: ?Estimated Creatinine Clearance: 32.7 mL/min (A) (by C-G formula based on SCr of 1.21 mg/dL (H)). ?Recent Labs  ?Lab 09/22/21 ?1201 09/23/21 ?0726  ?WBC 13.4* 11.4*  ? ? ?Liver Function Tests: ?Recent Labs  ?Lab 09/22/21 ?1201  ?AST 15  ?ALT 14  ?ALKPHOS 56  ?BILITOT 0.5  ?PROT 6.7  ?ALBUMIN 3.8  ? ?No results for input(s): LIPASE, AMYLASE in the last 168 hours. ?No results for  input(s): AMMONIA in the last 168 hours. ? ?ABG ?   ?Component Value Date/Time  ? PHART 7.236 (L) 09/22/2021 1543  ? PCO2ART 53.1 (H) 09/22/2021 1543  ? PO2ART 330 (H) 09/22/2021 1543  ? HCO3 22.6 09/22/2021 1543  ? TCO2 24 09/22/2021 1543  ? ACIDBASEDEF 5.0 (H) 09/22/2021 1543  ? O2SAT 100 09/22/2021 1543  ?  ? ?Coagulation Profile: ?Recent Labs  ?Lab 09/22/21 ?1201  ?INR 1.0  ? ? ?Cardiac Enzymes: ?No results for input(s): CKTOTAL, CKMB, CKMBINDEX, TROPONINI in the last 168 hours. ? ?HbA1C: ?Hgb A1c MFr Bld  ?Date/Time Value Ref Range Status  ?09/23/2021 07:26 AM 5.3 4.8 - 5.6 % Final  ?  Comment:  ?  (NOTE) ?Pre diabetes:          5.7%-6.4% ? ?Diabetes:              >6.4% ? ?Glycemic control for   <7.0% ?adults with diabetes ?  ? ? ?CBG: ?Recent Labs  ?Lab 09/22/21 ?1202  ?GLUCAP 115*  ? ?  ? ?Critical care time: 31 minutes ?  ? ?Heber , MD ?[place] [facility] ?Pager: [phone] ?Cell: [phone] ?After 7:00 pm call Elink  [phone] ? ? ? ? ?

## 2021-09-23 NOTE — Progress Notes (Signed)
PT Cancellation Note ? ?Patient Details ?Name: Mary Ortiz ?MRN: [id number] ?DOB: 07-30-1940 ? ? ?Cancelled Treatment:    Reason Eval/Treat Not Completed: Active bedrest order (s/p revascularization) ? ?Mary Ortiz, PT  ?Acute Rehab Services ? Pager [phone] ?Office [phone] ? ?Mary Ortiz ?09/23/2021, 8:04 AM ?

## 2021-09-23 NOTE — Evaluation (Addendum)
Occupational Therapy Evaluation ?Patient Details ?Name: Mary Ortiz ?MRN: [id number] ?DOB: 08/18/1940 ?Today's Date: 09/23/2021 ? ? ?History of Present Illness Pt is 81 yo female who presents with aphasia, R side weakness, found on floor by EMS. CT showed L CVA and pt underwent revascularization 3/17. PMH: HTN, carotid stenosis.  ? ?Clinical Impression ?  ?Patient admitted for the diagnosis above.  PTA she lives alone, and needed no assist with community mobility, ADL/IADL, and does not use an AD for mobility.  She has nieces that check in as needed at home.  Currently she is needing Min Guard for mild unsteadiness for in room mobility and ADL from sit/stand level.  OT to follow in the acute setting, but the patient wants to return home, and is open to [facility] OT if needed.  Anticipate some initial assist with home management and community mobility from family.   ?   ? ?Recommendations for follow up therapy are one component of a multi-disciplinary discharge planning process, led by the attending physician.  Recommendations may be updated based on patient status, additional functional criteria and insurance authorization.  ? ?Follow Up Recommendations ? Home health OT  ?  ?Assistance Recommended at Discharge Intermittent Supervision/Assistance  ?Patient can return home with the following Assistance with cooking/housework;Assist for transportation ? ?  ?Functional Status Assessment ? Patient has had a recent decline in their functional status and demonstrates the ability to make significant improvements in function in a reasonable and predictable amount of time.  ?Equipment Recommendations ? None recommended by OT  ?  ?Recommendations for Other Services   ? ? ?  ?Precautions / Restrictions Precautions ?Precautions: Fall ?Precaution Comments: watch BP ?Restrictions ?Weight Bearing Restrictions: No  ? ?  ? ?Mobility Bed Mobility ?Overal bed mobility: Needs Assistance ?Bed Mobility: Supine to Sit ?  ?  ?Supine to sit:  Supervision ?  ?  ?  ?  ? ?Transfers ?Overall transfer level: Needs assistance ?  ?Transfers: Sit to/from Stand, Bed to chair/wheelchair/BSC ?Sit to Stand: Min guard ?  ?  ?Step pivot transfers: Min guard ?  ?  ?  ?  ? ?  ?Balance Overall balance assessment: Needs assistance ?Sitting-balance support: Feet supported ?Sitting balance-Leahy Scale: Good ?  ?  ?Standing balance support: Reliant on assistive device for balance ?Standing balance-Leahy Scale: Fair ?  ?  ?  ?  ?  ?  ?  ?  ?  ?  ?  ?  ?   ? ?ADL either performed or assessed with clinical judgement  ? ?ADL   ?  ?  ?Grooming: Wash/dry hands;Supervision/safety ?  ?  ?  ?  ?  ?Upper Body Dressing : Min guard;Standing ?  ?Lower Body Dressing: Min guard;Sit to/from stand ?  ?Toilet Transfer: Minimal assistance;Ambulation ?Toilet Transfer Details (indicate cue type and reason): HHA without RW, Min Guard with RW in room ?  ?  ?  ?  ?  ?   ? ? ? ?Vision Patient Visual Report: No change from baseline ?   ?   ?Perception Perception ?Perception: Within Functional Limits ?  ?Praxis Praxis ?Praxis: Intact ?  ? ?Pertinent Vitals/Pain Pain Assessment ?Pain Assessment: No/denies pain  ? ? ? ?Hand Dominance Right ?  ?Extremity/Trunk Assessment Upper Extremity Assessment ?Upper Extremity Assessment: Overall WFL for tasks assessed ?  ?Lower Extremity Assessment ?Lower Extremity Assessment: Defer to PT evaluation ?  ?Cervical / Trunk Assessment ?Cervical / Trunk Assessment: Normal ?  ?Communication Communication ?Communication: No  difficulties ?  ?Cognition Arousal/Alertness: Awake/alert ?Behavior During Therapy: [facility] for tasks assessed/performed ?Overall Cognitive Status: Within Functional Limits for tasks assessed ?  ?  ?  ?  ?  ?  ?  ?  ?  ?  ?  ?  ?  ?  ?  ?  ?  ?  ?  ?General Comments   O2 sats in upper 80's with questionable wave form.  O2 replaced at the end of the evaluation.     ? ?  ?Exercises   ?  ?Shoulder Instructions    ? ? ?Home Living Family/patient expects to be  discharged to:: Private residence ?Living Arrangements: Alone ?Available Help at Discharge: Family;Available PRN/intermittently ?Type of Home: House ?Home Access: Level entry ?  ?  ?Home Layout: One level ?  ?  ?Bathroom Shower/Tub: Tub/shower unit ?  ?Bathroom Toilet: Standard ?Bathroom Accessibility: Yes ?How Accessible: Accessible via walker ?Home Equipment: None ?  ?  ? Lives With: Alone ? ?  ?Prior Functioning/Environment Prior Level of Function : Independent/Modified Independent;Driving ?  ?  ?  ?  ?  ?  ?  ?  ?  ? ?  ?  ?OT Problem List: Impaired balance (sitting and/or standing) ?  ?   ?OT Treatment/Interventions: Self-care/ADL training;Therapeutic activities;Balance training;Patient/family education  ?  ?OT Goals(Current goals can be found in the care plan section) Acute Rehab OT Goals ?Patient Stated Goal: Return home ?OT Goal Formulation: With patient ?Time For Goal Achievement: 10/06/21 ?Potential to Achieve Goals: Good ?ADL Goals ?Pt Will Perform Grooming: with modified independence;standing ?Pt Will Perform Upper Body Dressing: with modified independence;sitting;standing ?Pt Will Perform Lower Body Dressing: with modified independence;sit to/from stand ?Pt Will Transfer to Toilet: with modified independence;ambulating;regular height toilet  ?OT Frequency: Min 2X/week ?  ? ?Co-evaluation   ?  ?  ?  ?  ? ?  ?AM-PAC OT "6 Clicks" Daily Activity     ?Outcome Measure Help from another person eating meals?: None ?Help from another person taking care of personal grooming?: A Little ?Help from another person toileting, which includes using toliet, bedpan, or urinal?: A Little ?Help from another person bathing (including washing, rinsing, drying)?: A Little ?Help from another person to put on and taking off regular upper body clothing?: A Little ?Help from another person to put on and taking off regular lower body clothing?: A Little ?6 Click Score: 19 ?  ?End of Session Equipment Utilized During Treatment:  Gait belt;Rolling walker (2 wheels) ?Nurse Communication: Mobility status ? ?Activity Tolerance: Patient tolerated treatment well ?Patient left: in chair;with call bell/phone within reach;with family/visitor present ? ?OT Visit Diagnosis: Unsteadiness on feet (R26.81)  ?              ?Time: 4268-3419 ?OT Time Calculation (min): 28 min ?Charges:  OT General Charges ?$OT Visit: 1 Visit ?OT Evaluation ?$OT Eval Moderate Complexity: 1 Mod ?OT Treatments ?$Self Care/Home Management : 8-22 mins ? ?09/23/2021 ? ?RP, OTR/L ? ?Acute Rehabilitation Services ? ?Office:  [phone] ? ? ?Nelta Caudill D Kalonji Zurawski ?09/23/2021, 12:04 PM ?

## 2021-09-23 NOTE — Evaluation (Signed)
Speech Language Pathology Evaluation ?Patient Details ?Name: Mary Ortiz ?MRN: [id number] ?DOB: 01-Oct-1940 ?Today's Date: 09/23/2021 ?Time: [id number] ?  ? ?Problem List:  ?Patient Active Problem List  ? Diagnosis Date Noted  ? Stroke (cerebrum) ([place]) 09/22/2021  ? 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 Smoking 01/24/2019  ? Essential hypertension 01/23/2019  ? ?Past Medical History:  ?Past Medical History:  ?Diagnosis Date  ? Carotid artery stenosis   ? HTN (hypertension) 01/23/2019  ? Hyperlipidemia   ? ?Past Surgical History:  ?Past Surgical History:  ?Procedure Laterality Date  ? BACK SURGERY    ? 1977  ? CATARACT EXTRACTION, BILATERAL    ? 2019, 2016  ? Hystertectomy  1983  ? ?HPI:  ?81 year old female with new onset right sided facial droop, weakness, and aphasia. CT of the brain negative. CTA reveals left middle cerebral M1 occlusion.  ? ?Assessment / Plan / Recommendation ?Clinical Impression ? Speech and language function WFL.  Patient does present with mild short term memory deficit which per patient and family were not present prior. Patient will benefit from acute SLP f/u to maximize short term memory for return to independent living. ?   ?SLP Assessment ? SLP Recommendation/Assessment: Patient needs continued [facility] ?SLP Visit Diagnosis: Frontal lobe and executive function deficit ?Frontal lobe and executive function deficit following: Cerebral infarction  ?  ?Recommendations for follow up therapy are one component of a multi-disciplinary discharge planning process, led by the attending physician.  Recommendations may be updated based on patient status, additional functional criteria and insurance authorization. ?   ?Follow Up Recommendations ?  (TBD based on physical abilities)  ?  ?Assistance Recommended at Discharge ? None  ?Functional Status Assessment    ?Frequency and Duration min 1 x/week  ?2 weeks ?  ?    ?SLP Evaluation ?Cognition ? Overall Cognitive Status: Impaired/Different from baseline ?Arousal/Alertness: Awake/alert ?Orientation Level: Oriented X4 ?Memory: Impaired ?Memory Impairment: Decreased short term memory;Decreased recall of new information;Retrieval deficit ?Decreased Short Term Memory: Verbal complex;Functional complex ?Awareness: Appears intact ?Safety/Judgment: Appears intact  ?  ?   ?Comprehension ? Auditory Comprehension ?Overall Auditory Comprehension: Appears within functional limits for tasks assessed ?Reading Comprehension ?Reading Status: Within funtional limits  ?  ?Expression Expression ?Primary Mode of Expression: Verbal ?Verbal Expression ?Overall Verbal Expression: Appears within functional limits for tasks assessed   ?Oral / Motor ? Oral Motor/Sensory Function ?Overall Oral Motor/Sensory Function: Within functional limits ?Motor Speech ?Overall Motor Speech: Appears within functional limits for tasks assessed   ? Jimi Giza MA, CCC-SLP ?       ? ?Jaira Canady Meryl ?09/23/2021, 11:25 AM ? ?

## 2021-09-23 NOTE — Progress Notes (Signed)
Referring Physician(s): Dr. 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  Subjective:  Pt resting in bed. She denies pain or blurred vision. Niece at bedside states pt is back to normal. She adds that the pt passed swallow screen and has a diet ordered.   Allergies: Neosporin plus max st  Medications: Prior to Admission medications   Medication Sig Start Date End Date Taking? Authorizing Provider  amLODipine (NORVASC) 10 MG tablet TAKE 1 TABLET BY MOUTH ONCE DAILY. PLEASE SCHEDULE APPOINTMENT 02/09/20   Tolia, Sunit, DO  Cholecalciferol (VITAMIN D3) 50 MCG (2000 UT) TABS Take by mouth daily.    [provider]  furosemide (LASIX) 20 MG tablet Take 1 tablet by mouth once daily 08/24/21   Tolia, Sunit, DO  meclizine (ANTIVERT) 12.5 MG tablet Take 12.5 mg by mouth every 8 (eight) hours as needed. 09/03/19   [provider]  olmesartan (BENICAR) 5 MG tablet Take by mouth daily.    [provider]     Vital Signs: BP 136/74   Pulse 60   Temp 98.2 F (36.8 C) (Oral)   Resp (!) 25   Ht 5\' 2"  (1.575 m)   Wt 152 lb 1.9 oz (69 kg)   SpO2 94%   BMI 27.82 kg/m   Physical Exam Vitals reviewed.  Constitutional:      Appearance: Normal appearance. She is not ill-appearing.  HENT:     Head: Normocephalic and atraumatic.  Eyes:     Extraocular Movements: Extraocular movements intact.     Pupils: Pupils are equal, round, and reactive to light.  Cardiovascular:     Rate and Rhythm: Normal rate and regular rhythm.  Pulmonary:     Effort: Pulmonary effort is normal. No respiratory distress.  Skin:    General: Skin is warm and dry.     Comments: Right puncture site is soft with no active bleeding and no  appreciable pseudoaneurysm. Dressing is C/D/I   Neurological:     Mental Status: She is alert and oriented to person, place, and time.     Comments: Alert, aware and oriented X 3 Speech and comprehension is intact.  PERRLA bilaterally No facial droop noted Tongue midline Can spontaneously move all 4 extremities.  Hand grip strength equal bilaterally. Negative pronator drift. Dorsiflexion flexion 5/5, Plantar flexion 5/5 bilaterally.  Fine motor and coordination grossly in tact Gait not assessed Romberg not assessed Heel to toe not assessed Distal pulses not assessed   Psychiatric:        Mood and Affect: Mood normal.        Behavior: Behavior normal.        Thought Content: Thought content normal.        Judgment: Judgment normal.    Imaging: Portable Chest x-ray  Result Date: 09/22/2021 CLINICAL DATA:  Code stroke. EXAM: PORTABLE CHEST 1 VIEW COMPARISON:  None FINDINGS: 2 portable supine radiographs of the chest. Endotracheal tube terminates 4.3 cm above carina. Mild cardiomegaly. Atherosclerosis in the transverse aorta. The left pleural space is poorly evaluated secondary to overlying soft tissues. No right pleural fluid and no pneumothorax. No congestive failure. No lobar consolidation. Probable patchy left base airspace disease. IMPRESSION: Endotracheal tube appropriately positioned. Limited portable radiographs with suspicion of patchy left  base airspace disease. Most likely atelectasis. Consider radiographic follow-up if concern for pneumonia or aspiration. Aortic Atherosclerosis (ICD10-I70.0). Electronically Signed   By: Jeronimo Greaves M.D.   On: 09/22/2021 15:33   ECHOCARDIOGRAM COMPLETE  Result Date: 09/22/2021    ECHOCARDIOGRAM REPORT   Patient Name:   Mary Ortiz Date of Exam: 09/22/2021 Medical Rec #:  [id number]        Height:       62.0 in Accession #:    [id number]       Weight:       152.1 lb Date of Birth:  13-Nov-1940         BSA:          1.702 m Patient Age:    80  years         BP:           124/59 mmHg Patient Gender: F                HR:           61 bpm. Exam Location:  Inpatient Procedure: 2D Echo, 3D Echo, Cardiac Doppler and Color Doppler Indications:    Stroke  History:        Patient has no prior history of Echocardiogram examinations.                 Stroke; Risk Factors:Current Smoker, Hypertension and                 Dyslipidemia.  Sonographer:    Sheralyn Boatman RDCS Referring Phys: [id number] [facility]  Sonographer Comments: Suboptimal parasternal window and echo performed with patient supine and on artificial respirator. IMPRESSIONS  1. Left ventricular ejection fraction, by estimation, is 60 to 65%. The left ventricle has normal function. The left ventricle has no regional wall motion abnormalities. Left ventricular diastolic parameters are consistent with Grade II diastolic dysfunction (pseudonormalization).  2. Right ventricular systolic function is mildly reduced. The right ventricular size is normal. There is mildly elevated pulmonary artery systolic pressure. The estimated right ventricular systolic pressure is 42.1 mmHg.  3. Left atrial size was mildly dilated.  4. Right atrial size was mildly dilated.  5. The mitral valve is normal in structure. Mild to moderate mitral valve regurgitation. No evidence of mitral stenosis.  6. Tricuspid valve regurgitation is moderate.  7. The aortic valve is tricuspid. Aortic valve regurgitation is mild. No aortic stenosis is present.  8. The inferior vena cava is normal in size with <50% respiratory variability, suggesting right atrial pressure of 8 mmHg. FINDINGS  Left Ventricle: Left ventricular ejection fraction, by estimation, is 60 to 65%. The left ventricle has normal function. The left ventricle has no regional wall motion abnormalities. The left ventricular internal cavity size was normal in size. There is  no left ventricular hypertrophy. Left ventricular diastolic parameters are consistent with Grade II diastolic  dysfunction (pseudonormalization). Right Ventricle: The right ventricular size is normal. No increase in right ventricular wall thickness. Right ventricular systolic function is mildly reduced. There is mildly elevated pulmonary artery systolic pressure. The tricuspid regurgitant velocity  is 2.92 m/s, and with an assumed right atrial pressure of 8 mmHg, the estimated right ventricular systolic pressure is 42.1 mmHg. Left Atrium: Left atrial size was mildly dilated. Right Atrium: Right atrial size was mildly dilated. Pericardium: There is no evidence of pericardial effusion. Mitral Valve: The mitral valve is normal in structure. Mild to moderate mitral valve regurgitation. No evidence of  mitral valve stenosis. Tricuspid Valve: The tricuspid valve is normal in structure. Tricuspid valve regurgitation is moderate. Aortic Valve: The aortic valve is tricuspid. Aortic valve regurgitation is mild. No aortic stenosis is present. Pulmonic Valve: The pulmonic valve was normal in structure. Pulmonic valve regurgitation is trivial. Aorta: The aortic root is normal in size and structure. Venous: The inferior vena cava is normal in size with less than 50% respiratory variability, suggesting right atrial pressure of 8 mmHg. IAS/Shunts: No atrial level shunt detected by color flow Doppler.  LEFT VENTRICLE PLAX 2D LVIDd:         4.70 cm     Diastology LVIDs:         3.20 cm     LV e' medial:    5.08 cm/s LV PW:         1.10 cm     LV E/e' medial:  21.2 LV IVS:        1.20 cm     LV e' lateral:   7.80 cm/s LVOT diam:     1.90 cm     LV E/e' lateral: 13.8 LV SV:         99 LV SV Index:   58 LVOT Area:     2.84 cm  LV Volumes (MOD) LV vol d, MOD A2C: 54.7 ml LV vol d, MOD A4C: 75.5 ml LV vol s, MOD A2C: 23.2 ml LV vol s, MOD A4C: 22.7 ml LV SV MOD A2C:     31.5 ml LV SV MOD A4C:     75.5 ml LV SV MOD BP:      44.3 ml RIGHT VENTRICLE            IVC RV S prime:     9.90 cm/s  IVC diam: 2.10 cm TAPSE (M-mode): 1.8 cm LEFT ATRIUM              Index        RIGHT ATRIUM           Index LA diam:        3.70 cm 2.17 cm/m   RA Area:     18.20 cm LA Vol (A2C):   30.3 ml 17.80 ml/m  RA Volume:   48.00 ml  28.21 ml/m LA Vol (A4C):   50.2 ml 29.50 ml/m LA Biplane Vol: 40.7 ml 23.92 ml/m  AORTIC VALVE             PULMONIC VALVE LVOT Vmax:   123.00 cm/s PR End Diast Vel: 2.28 msec LVOT Vmean:  82.300 cm/s LVOT VTI:    0.350 m  AORTA Ao Root diam: 3.10 cm Ao Asc diam:  3.00 cm MITRAL VALVE                  TRICUSPID VALVE MV Area (PHT): 3.24 cm       TR Peak grad:   34.1 mmHg MV Decel Time: 234 msec       TR Vmax:        292.00 cm/s MR Peak grad:    87.6 mmHg MR Mean grad:    64.0 mmHg    SHUNTS MR Vmax:         468.00 cm/s  Systemic VTI:  0.35 m MR Vmean:        383.0 cm/s   Systemic Diam: 1.90 cm MR PISA:         2.26 cm MR PISA Eff ROA: 19 mm MR PISA Radius:  0.60  cm MV E velocity: 107.50 cm/s MV A velocity: 55.30 cm/s MV E/A ratio:  1.94 Dalton McleanMD Electronically signed by Wilfred Lacy Signature Date/Time: 09/22/2021/5:12:27 PM    Final    CT HEAD CODE STROKE WO CONTRAST  Result Date: 09/22/2021 CLINICAL DATA:  Code stroke. Neuro deficit, acute, stroke suspected; Stroke, follow up EXAM: CT HEAD WITHOUT CONTRAST CT ANGIOGRAPHY HEAD AND NECK CT PERFUSION BRAIN TECHNIQUE: Multidetector CT imaging of the head using the standard protocol without contrast. Multidetector CT imaging of the head and neck was performed using the standard protocol during bolus administration of intravenous contrast. Multiplanar CT image reconstructions and MIPs were obtained to evaluate the vascular anatomy. Carotid stenosis measurements (when applicable) are obtained utilizing NASCET criteria, using the distal internal carotid diameter as the denominator. Multiphase CT imaging of the brain was performed following IV bolus contrast injection. Subsequent parametric perfusion maps were calculated using RAPID software. RADIATION DOSE REDUCTION: This exam was performed  according to the departmental dose-optimization program which includes automated exposure control, adjustment of the mA and/or kV according to patient size and/or use of iterative reconstruction technique. CONTRAST:  OMNIPAQUE IOHEXOL 350 MG/ML SOLN COMPARISON:  None. FINDINGS: CT HEAD Brain: There is no acute intracranial hemorrhage, mass effect, or edema. Gray-white differentiation is preserved. There is no extra-axial fluid collection. Ventricles and sulci are within normal limits in size and configuration. Patchy low-density in the supratentorial white matter is nonspecific but may reflect mild chronic microvascular ischemic changes. 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 Vascular: No hyperdense vessel. Skull: Calvarium is unremarkable. Sinuses/Orbits: No acute finding. Other: None. Review of the MIP images confirms the above findings CTA NECK Aortic arch: Mixed plaque along the arch and patent great vessel origins. Calcified plaque along the left subclavian origin likely causes at least 50% stenosis with suboptimal evaluation due to artifact. Right carotid system: Patent. Mild calcified plaque along the common carotid. Irregular noncalcified plaque along the proximal internal carotid with less than 50% stenosis. Partially retropharyngeal course of the ICA. Left carotid system: Patent. Mild calcified plaque along the common carotid. Mixed but primarily calcified plaque at the bifurcation and proximal internal carotid with less than 50% stenosis. Vertebral arteries: Patent and codominant.  No stenosis. Skeleton: Cervical spine degenerative changes. Other neck: Unremarkable. Upper chest: Emphysema.  Few sub 3 mm nodules. Review of the MIP images confirms the above findings CTA HEAD Anterior circulation: Intracranial internal carotid arteries are patent with calcified plaque causing minor stenosis. There is occlusion of the mid to distal left M1 MCA beyond origin of a large  posterior division branch. Minimal partial reconstitution at the M2 level. Right middle and both anterior cerebral arteries are patent. A small anterior communicating artery is present. Posterior circulation: Intracranial vertebral arteries are patent. Basilar artery is patent. Major cerebellar artery origins are patent. Posterior cerebral arteries are patent. Posterior communicating arteries are not identified. Venous sinuses: Patent as allowed by contrast bolus timing. Review of the MIP images confirms the above findings CT Brain Perfusion Findings: CBF (<30%) Volume: 30mL Perfusion (Tmax>6.0s) volume: 70mL Mismatch Volume: 40mL Infarction Location: Left MCA territory. IMPRESSION: There is no acute intracranial hemorrhage or evidence of acute infarction. ASPECT score is 10. 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 Minimal partial reconstitution at the M2 level. Perfusion imaging demonstrates core infarction of 30 mL and penumbra of 40 mL in the left MCA territory. No hemodynamically significant  stenosis at the carotid origins. Emphysema. Few sub 3 mm nodules in the included upper lobes. Non-contrast chest CT can be considered in 12 months. This recommendation follows the consensus statement: Guidelines for Management of Incidental Pulmonary Nodules Detected on CT Images: From the Fleischner Society 2017; Radiology 2017; 284:228-243. Preliminary results were communicated to Dr. Roda Shutters at 12:25 pm on 09/22/2021 by text page via the [facility] messaging system. Electronically Signed   By: Guadlupe Spanish M.D.   On: 09/22/2021 12:39   CT ANGIO HEAD NECK W WO CM W PERF (CODE STROKE)  Result Date: 09/22/2021 CLINICAL DATA:  Code stroke. Neuro deficit, acute, stroke suspected; Stroke, follow up EXAM: CT HEAD WITHOUT CONTRAST CT ANGIOGRAPHY HEAD AND NECK CT PERFUSION BRAIN TECHNIQUE: Multidetector CT imaging of the head using the standard protocol without contrast.  Multidetector CT imaging of the head and neck was performed using the standard protocol during bolus administration of intravenous contrast. Multiplanar CT image reconstructions and MIPs were obtained to evaluate the vascular anatomy. Carotid stenosis measurements (when applicable) are obtained utilizing NASCET criteria, using the distal internal carotid diameter as the denominator. Multiphase CT imaging of the brain was performed following IV bolus contrast injection. Subsequent parametric perfusion maps were calculated using RAPID software. RADIATION DOSE REDUCTION: This exam was performed according to the departmental dose-optimization program which includes automated exposure control, adjustment of the mA and/or kV according to patient size and/or use of iterative reconstruction technique. CONTRAST:  OMNIPAQUE IOHEXOL 350 MG/ML SOLN COMPARISON:  None. FINDINGS: CT HEAD Brain: There is no acute intracranial hemorrhage, mass effect, or edema. Gray-white differentiation is preserved. There is no extra-axial fluid collection. Ventricles and sulci are within normal limits in size and configuration. Patchy low-density in the supratentorial white matter is nonspecific but may reflect mild chronic microvascular ischemic changes. 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 Vascular: No hyperdense vessel. Skull: Calvarium is unremarkable. Sinuses/Orbits: No acute finding. Other: None. Review of the MIP images confirms the above findings CTA NECK Aortic arch: Mixed plaque along the arch and patent great vessel origins. Calcified plaque along the left subclavian origin likely causes at least 50% stenosis with suboptimal evaluation due to artifact. Right carotid system: Patent. Mild calcified plaque along the common carotid. Irregular noncalcified plaque along the proximal internal carotid with less than 50% stenosis. Partially retropharyngeal course of the ICA. Left carotid system: Patent. Mild  calcified plaque along the common carotid. Mixed but primarily calcified plaque at the bifurcation and proximal internal carotid with less than 50% stenosis. Vertebral arteries: Patent and codominant.  No stenosis. Skeleton: Cervical spine degenerative changes. Other neck: Unremarkable. Upper chest: Emphysema.  Few sub 3 mm nodules. Review of the MIP images confirms the above findings CTA HEAD Anterior circulation: Intracranial internal carotid arteries are patent with calcified plaque causing minor stenosis. There is occlusion of the mid to distal left M1 MCA beyond origin of a large posterior division branch. Minimal partial reconstitution at the M2 level. Right middle and both anterior cerebral arteries are patent. A small anterior communicating artery is present. Posterior circulation: Intracranial vertebral arteries are patent. Basilar artery is patent. Major cerebellar artery origins are patent. Posterior cerebral arteries are patent. Posterior communicating arteries are not identified. Venous sinuses: Patent as allowed by contrast bolus timing. Review of the MIP images confirms the above findings CT Brain Perfusion Findings: CBF (<30%) Volume: 30mL Perfusion (Tmax>6.0s) volume: 70mL Mismatch Volume: 40mL Infarction Location: Left MCA territory. IMPRESSION: There is no  acute intracranial hemorrhage or evidence of acute infarction. ASPECT score is 10. 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 Minimal partial reconstitution at the M2 level. Perfusion imaging demonstrates core infarction of 30 mL and penumbra of 40 mL in the left MCA territory. No hemodynamically significant stenosis at the carotid origins. Emphysema. Few sub 3 mm nodules in the included upper lobes. Non-contrast chest CT can be considered in 12 months. This recommendation follows the consensus statement: Guidelines for Management of Incidental Pulmonary Nodules Detected on CT Images: From the  Fleischner Society 2017; Radiology 2017; 284:228-243. Preliminary results were communicated to Dr. Roda Shutters at 12:25 pm on 09/22/2021 by text page via the [facility] messaging system. Electronically Signed   By: Guadlupe Spanish M.D.   On: 09/22/2021 12:39    Labs:  CBC: Recent Labs    09/22/21 1201 09/22/21 1208 09/22/21 1543 09/23/21 0726  WBC 13.4*  --   --  11.4*  HGB 12.5 12.9 10.5* 10.7*  HCT 36.2 38.0 31.0* 33.1*  PLT 193  --   --  222    COAGS: Recent Labs    09/22/21 1201  INR 1.0  APTT 30    BMP: Recent Labs    09/22/21 1201 09/22/21 1208 09/22/21 1543 09/23/21 0726  NA 138 138 139 140  K 4.4 4.4 4.5 4.3  CL 107 106  --  113*  CO2 21*  --   --  19*  GLUCOSE 116* 112*  --  106*  BUN 20 23  --  13  CALCIUM 9.1  --   --  8.2*  CREATININE 1.47* 1.40*  --  1.21*  GFRNONAA 35*  --   --  45*    LIVER FUNCTION TESTS: Recent Labs    09/22/21 1201  BILITOT 0.5  AST 15  ALT 14  ALKPHOS 56  PROT 6.7  ALBUMIN 3.8    Assessment and Plan:  Pt resting in bed. She denies pain or blurred vision. Niece at bedside states pt is back to normal. She adds that the pt passed swallow screen and has a diet ordered.  Alert, aware and oriented X 3 Speech and comprehension is intact.  PERRLA bilaterally No facial droop noted Tongue midline Can spontaneously move all 4 extremities.  Hand grip strength equal bilaterally. Negative pronator drift. Dorsiflexion flexion 5/5, Plantar flexion 5/5 bilaterally.  Fine motor and coordination grossly in tact. R femoral puncture site is soft with no active bleeding and no appreciable pseudoaneurysm. Dressing is C/D/I   Neuro/NIR to continue to follow.   Electronically Signed: Shon Hough, NP 09/23/2021, 11:11 AM   I spent a total of 15 Minutes at the the patient's bedside AND on the patient's hospital floor or unit, greater than 50% of which was counseling/coordinating care for S/p complete revascularization of occluded left MCA M1  segment embo trap retrieval device and contact aspiration achieving a TICI 3 revascularization for occluded left middle cerebral M1 segment.

## 2021-09-23 NOTE — Progress Notes (Signed)
STROKE TEAM PROGRESS NOTE  ? ?SUBJECTIVE (INTERVAL HISTORY) ?Her niece is at the bedside.  Overall her condition is rapidly improving. She is extubated, awake alert, orientated x3, clear speech, fluent language, moving all extremities symmetrically.  Only has mild right nasolabial fold flattening.  Otherwise she is much improved from yesterday.  Pending MRI. ? ? ?OBJECTIVE ?Temp:  [97.7 ?F (36.5 ?C)-99.2 ?F (37.3 ?C)] 98.2 ?F (36.8 ?C) (03/18 0800) ?Pulse Rate:  [48-76] 60 (03/18 1045) ?Resp:  [11-35] 25 (03/18 1045) ?BP: (72-160)/(38-132) 136/74 (03/18 1045) ?SpO2:  [90 %-100 %] 94 % (03/18 1045) ?FiO2 (%):  [40 %-100 %] 40 % (03/17 1600) ?Weight:  [69 kg] 69 kg (03/17 1200) ? ?Recent Labs  ?Lab 09/22/21 ?1202  ?GLUCAP 115*  ? ?Recent Labs  ?Lab 09/22/21 ?1201 09/22/21 ?1208 09/22/21 ?1543 09/23/21 ?0726  ?NA 138 138 139 140  ?K 4.4 4.4 4.5 4.3  ?CL 107 106  --  113*  ?CO2 21*  --   --  19*  ?GLUCOSE 116* 112*  --  106*  ?BUN 20 23  --  13  ?CREATININE 1.47* 1.40*  --  1.21*  ?CALCIUM 9.1  --   --  8.2*  ? ?Recent Labs  ?Lab 09/22/21 ?1201  ?AST 15  ?ALT 14  ?ALKPHOS 56  ?BILITOT 0.5  ?PROT 6.7  ?ALBUMIN 3.8  ? ?Recent Labs  ?Lab 09/22/21 ?1201 09/22/21 ?1208 09/22/21 ?1543 09/23/21 ?0726  ?WBC 13.4*  --   --  11.4*  ?NEUTROABS 11.6*  --   --  8.3*  ?HGB 12.5 12.9 10.5* 10.7*  ?HCT 36.2 38.0 31.0* 33.1*  ?MCV 95.8  --   --  98.5  ?PLT 193  --   --  222  ? ?No results for input(s): CKTOTAL, CKMB, CKMBINDEX, TROPONINI in the last 168 hours. ?Recent Labs  ?  09/22/21 ?1201  ?LABPROT 13.1  ?INR 1.0  ? ?No results for input(s): COLORURINE, LABSPEC, PHURINE, GLUCOSEU, HGBUR, BILIRUBINUR, KETONESUR, PROTEINUR, UROBILINOGEN, NITRITE, LEUKOCYTESUR in the last 72 hours. ? ?Invalid input(s): APPERANCEUR  ?   ?Component Value Date/Time  ? CHOL 113 09/23/2021 0726  ? CHOL 165 03/02/2020 0837  ? TRIG 126 09/23/2021 0726  ? TRIG 127 09/23/2021 0726  ? HDL 38 (L) 09/23/2021 0726  ? HDL 52 03/02/2020 0837  ? CHOLHDL 3.0 09/23/2021  0726  ? VLDL 25 09/23/2021 0726  ? LDLCALC 50 09/23/2021 0726  ? LDLCALC 89 03/02/2020 0837  ? ?Lab Results  ?Component Value Date  ? HGBA1C 5.3 09/23/2021  ? ?No results found for: LABOPIA, COCAINSCRNUR, LABBENZ, AMPHETMU, THCU, LABBARB  ?No results for input(s): ETH in the last 168 hours. ? ?I have personally reviewed the radiological images below and agree with the radiology interpretations. ? ?Portable Chest x-ray ? ?Result Date: 09/22/2021 ?CLINICAL DATA:  Code stroke. EXAM: PORTABLE CHEST 1 VIEW COMPARISON:  None FINDINGS: 2 portable supine radiographs of the chest. Endotracheal tube terminates 4.3 cm above carina. Mild cardiomegaly. Atherosclerosis in the transverse aorta. The left pleural space is poorly evaluated secondary to overlying soft tissues. No right pleural fluid and no pneumothorax. No congestive failure. No lobar consolidation. Probable patchy left base airspace disease. IMPRESSION: Endotracheal tube appropriately positioned. Limited portable radiographs with suspicion of patchy left base airspace disease. Most likely atelectasis. Consider radiographic follow-up if concern for pneumonia or aspiration. Aortic Atherosclerosis (ICD10-I70.0). Electronically Signed   By: Jeronimo GreavesKyle  Talbot M.D.   On: 09/22/2021 15:33  ? ?ECHOCARDIOGRAM COMPLETE ? ?Result Date: 09/22/2021 ?  ECHOCARDIOGRAM REPORT   Patient Name:   Mary Ortiz Date of Exam: 09/22/2021 Medical Rec #:  [id number]        Height:       62.0 in Accession #:    [id number]       Weight:       152.1 lb Date of Birth:  Jul 19, 1940         BSA:          1.702 m? Patient Age:    80 years         BP:           124/59 mmHg Patient Gender: F                HR:           61 bpm. Exam Location:  Inpatient Procedure: 2D Echo, 3D Echo, Cardiac Doppler and Color Doppler Indications:    Stroke  History:        Patient has no prior history of Echocardiogram examinations.                 Stroke; Risk Factors:Current Smoker, Hypertension and                  Dyslipidemia.  Sonographer:    Sheralyn Boatman RDCS Referring Phys: [id number] [facility]  Sonographer Comments: Suboptimal parasternal window and echo performed with patient supine and on artificial respirator. IMPRESSIONS  1. Left ventricular ejection fraction, by estimation, is 60 to 65%. The left ventricle has normal function. The left ventricle has no regional wall motion abnormalities. Left ventricular diastolic parameters are consistent with Grade II diastolic dysfunction (pseudonormalization).  2. Right ventricular systolic function is mildly reduced. The right ventricular size is normal. There is mildly elevated pulmonary artery systolic pressure. The estimated right ventricular systolic pressure is 42.1 mmHg.  3. Left atrial size was mildly dilated.  4. Right atrial size was mildly dilated.  5. The mitral valve is normal in structure. Mild to moderate mitral valve regurgitation. No evidence of mitral stenosis.  6. Tricuspid valve regurgitation is moderate.  7. The aortic valve is tricuspid. Aortic valve regurgitation is mild. No aortic stenosis is present.  8. The inferior vena cava is normal in size with <50% respiratory variability, suggesting right atrial pressure of 8 mmHg. FINDINGS  Left Ventricle: Left ventricular ejection fraction, by estimation, is 60 to 65%. The left ventricle has normal function. The left ventricle has no regional wall motion abnormalities. The left ventricular internal cavity size was normal in size. There is  no left ventricular hypertrophy. Left ventricular diastolic parameters are consistent with Grade II diastolic dysfunction (pseudonormalization). Right Ventricle: The right ventricular size is normal. No increase in right ventricular wall thickness. Right ventricular systolic function is mildly reduced. There is mildly elevated pulmonary artery systolic pressure. The tricuspid regurgitant velocity  is 2.92 m/s, and with an assumed right atrial pressure of 8 mmHg, the estimated right  ventricular systolic pressure is 42.1 mmHg. Left Atrium: Left atrial size was mildly dilated. Right Atrium: Right atrial size was mildly dilated. Pericardium: There is no evidence of pericardial effusion. Mitral Valve: The mitral valve is normal in structure. Mild to moderate mitral valve regurgitation. No evidence of mitral valve stenosis. Tricuspid Valve: The tricuspid valve is normal in structure. Tricuspid valve regurgitation is moderate. Aortic Valve: The aortic valve is tricuspid. Aortic valve regurgitation is mild. No aortic stenosis is present. Pulmonic Valve: The pulmonic valve was normal in structure.  Pulmonic valve regurgitation is trivial. Aorta: The aortic root is normal in size and structure. Venous: The inferior vena cava is normal in size with less than 50% respiratory variability, suggesting right atrial pressure of 8 mmHg. IAS/Shunts: No atrial level shunt detected by color flow Doppler.  LEFT VENTRICLE PLAX 2D LVIDd:         4.70 cm     Diastology LVIDs:         3.20 cm     LV e' medial:    5.08 cm/s LV PW:         1.10 cm     LV E/e' medial:  21.2 LV IVS:        1.20 cm     LV e' lateral:   7.80 cm/s LVOT diam:     1.90 cm     LV E/e' lateral: 13.8 LV SV:         99 LV SV Index:   58 LVOT Area:     2.84 cm?  LV Volumes (MOD) LV vol d, MOD A2C: 54.7 ml LV vol d, MOD A4C: 75.5 ml LV vol s, MOD A2C: 23.2 ml LV vol s, MOD A4C: 22.7 ml LV SV MOD A2C:     31.5 ml LV SV MOD A4C:     75.5 ml LV SV MOD BP:      44.3 ml RIGHT VENTRICLE            IVC RV S prime:     9.90 cm/s  IVC diam: 2.10 cm TAPSE (M-mode): 1.8 cm LEFT ATRIUM             Index        RIGHT ATRIUM           Index LA diam:        3.70 cm 2.17 cm/m?   RA Area:     18.20 cm? LA Vol (A2C):   30.3 ml 17.80 ml/m?  RA Volume:   48.00 ml  28.21 ml/m? LA Vol (A4C):   50.2 ml 29.50 ml/m? LA Biplane Vol: 40.7 ml 23.92 ml/m?  AORTIC VALVE             PULMONIC VALVE LVOT Vmax:   123.00 cm/s PR End Diast Vel: 2.28 msec LVOT Vmean:  82.300 cm/s LVOT  VTI:    0.350 m  AORTA Ao Root diam: 3.10 cm Ao Asc diam:  3.00 cm MITRAL VALVE                  TRICUSPID VALVE MV Area (PHT): 3.24 cm?       TR Peak grad:   34.1 mmHg MV Decel Time: 234 msec       TR Vmax:

## 2021-09-23 NOTE — Progress Notes (Signed)
VASCULAR LAB ? ? ? ?Bilateral lower extremity venous duplex has been performed. ? ?See CV proc for preliminary results. ? ? ?Priyah Schmuck, RVT ?09/23/2021, 4:36 PM ? ?

## 2021-09-24 ENCOUNTER — Inpatient Hospital Stay (HOSPITAL_COMMUNITY): Payer: Medicare Other

## 2021-09-24 DIAGNOSIS — I9589 Other hypotension: Secondary | ICD-10-CM | POA: Diagnosis not present

## 2021-09-24 DIAGNOSIS — I6602 Occlusion and stenosis of left middle cerebral artery: Secondary | ICD-10-CM | POA: Diagnosis not present

## 2021-09-24 DIAGNOSIS — R001 Bradycardia, unspecified: Secondary | ICD-10-CM | POA: Diagnosis not present

## 2021-09-24 DIAGNOSIS — I63512 Cerebral infarction due to unspecified occlusion or stenosis of left middle cerebral artery: Secondary | ICD-10-CM | POA: Diagnosis not present

## 2021-09-24 LAB — BASIC METABOLIC PANEL
Anion gap: 7 (ref 5–15)
BUN: 15 mg/dL (ref 8–23)
CO2: 20 mmol/L — ABNORMAL LOW (ref 22–32)
Calcium: 8.1 mg/dL — ABNORMAL LOW (ref 8.9–10.3)
Chloride: 112 mmol/L — ABNORMAL HIGH (ref 98–111)
Creatinine, Ser: 1.3 mg/dL — ABNORMAL HIGH (ref 0.44–1.00)
GFR, Estimated: 41 mL/min — ABNORMAL LOW (ref >60)
Glucose, Bld: 105 mg/dL — ABNORMAL HIGH (ref 70–99)
Potassium: 4 mmol/L (ref 3.5–5.1)
Sodium: 139 mmol/L (ref 135–145)

## 2021-09-24 LAB — CBC
HCT: 28.8 % — ABNORMAL LOW (ref 36.0–46.0)
Hemoglobin: 9.3 g/dL — ABNORMAL LOW (ref 12.0–15.0)
MCH: 31.7 pg (ref 26.0–34.0)
MCHC: 32.3 g/dL (ref 30.0–36.0)
MCV: 98.3 fL (ref 80.0–100.0)
Platelets: 144 10*3/uL — ABNORMAL LOW (ref 150–400)
RBC: 2.93 MIL/uL — ABNORMAL LOW (ref 3.87–5.11)
RDW: 13.9 % (ref 11.5–15.5)
WBC: 9 10*3/uL (ref 4.0–10.5)
nRBC: 0 % (ref 0.0–0.2)

## 2021-09-24 IMAGING — MR MR HEAD W/O CM
10 of 11 series · 42 of 48 positions shown · non-contrast
Comparison: No pertinent prior exam.

CLINICAL DATA: Stroke follow-up

EXAM:
MRI HEAD WITHOUT CONTRAST
MRA HEAD WITHOUT CONTRAST
TECHNIQUE: Multiplanar, multi-echo pulse sequences of the brain and surrounding
structures were acquired without intravenous contrast. Angiographic
images of the Circle of Willis were acquired using MRA technique
without intravenous contrast.

[Series 5: DWI · axial · 3.0mm · 0.88mm/px · z∈[-76,+65]mm · 9 of 96 slices shown (1 of 4)]
[im 1/96]
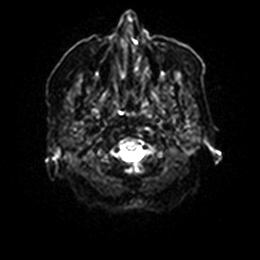
[im 12/96]
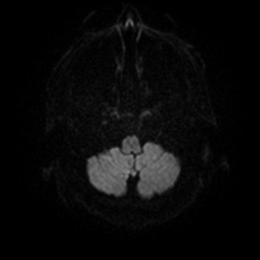
[im 24/96]
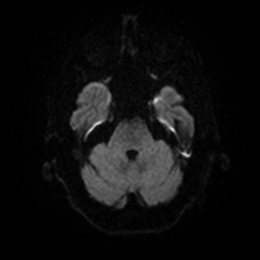
[im 36/96]
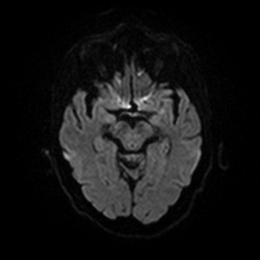
[im 48/96]
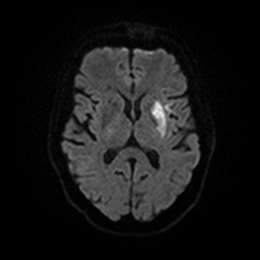
[im 60/96]
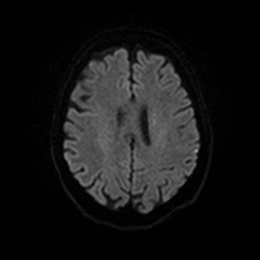
[im 72/96]
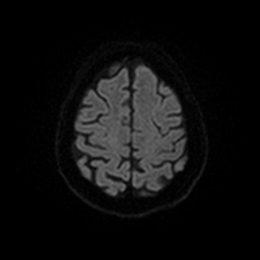
[im 84/96]
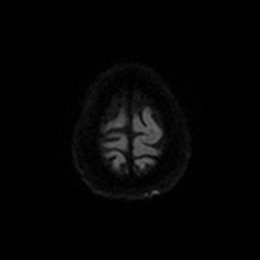
[im 96/96]
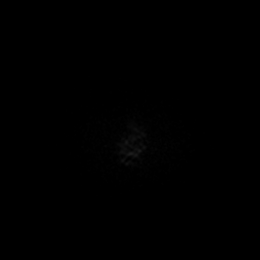

[Series 6: DWI · axial · 3.0mm · 0.88mm/px · z∈[-76,+65]mm · 4 of 48 slices shown (2 of 4)]
[im 1/48]
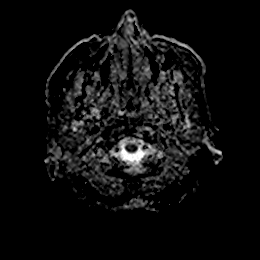
[im 16/48]
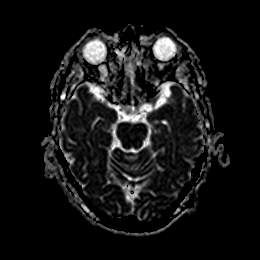
[im 32/48]
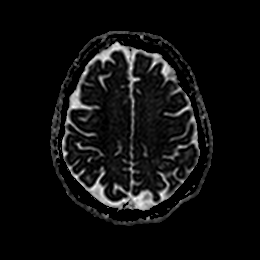
[im 48/48]
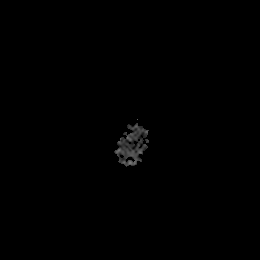

[Series 7: DWI · coronal · 4.0mm · 0.88mm/px · 6 of 64 slices shown (3 of 4)]
[im 1/64]
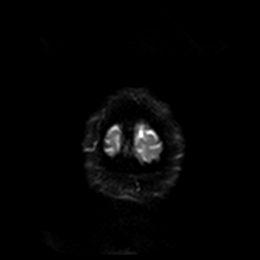
[im 13/64]
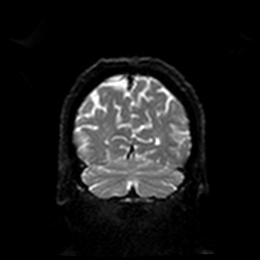
[im 26/64]
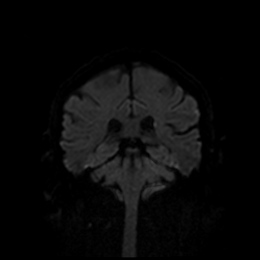
[im 38/64]
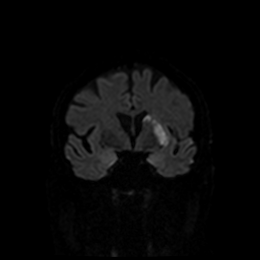
[im 51/64]
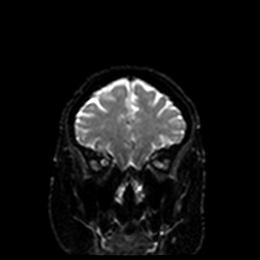
[im 64/64]
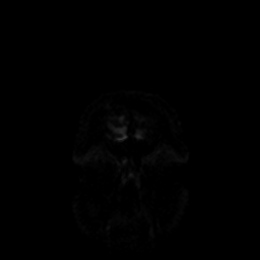

[Series 8: DWI · coronal · 4.0mm · 0.88mm/px · 3 of 31 slices shown (4 of 4)]
[im 1/31]
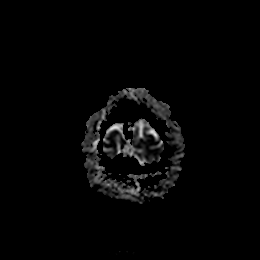
[im 16/31]
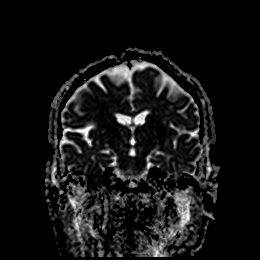
[im 31/31]
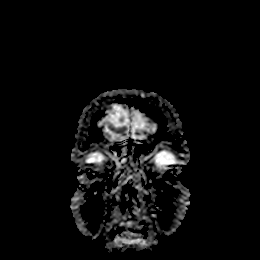

[Series 9: T1 · sagittal · 5.0mm · 0.75mm/px · 2 of 23 slices shown]
[im 1/23]
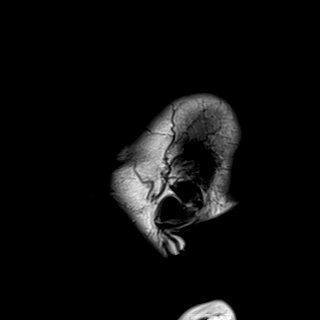
[im 23/23]
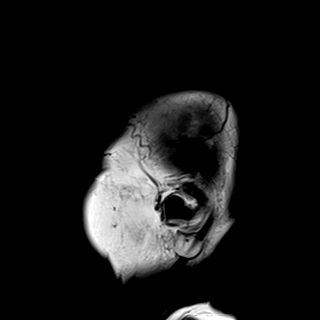

[Series 10: T2 · axial · 5.0mm · 0.72mm/px · z∈[-77,+66]mm · 2 of 25 slices shown (1 of 2)]
[im 1/25]
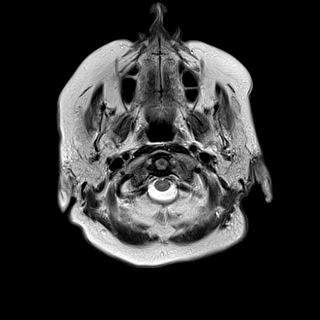
[im 25/25]
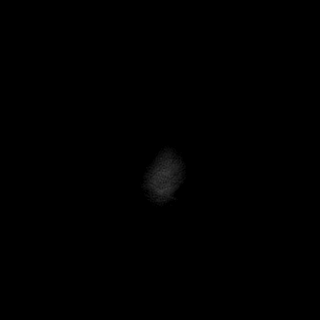

[Series 11: FLAIR · axial · 5.0mm · 0.45mm/px · z∈[-78,+66]mm · 2 of 25 slices shown]
[im 1/25]
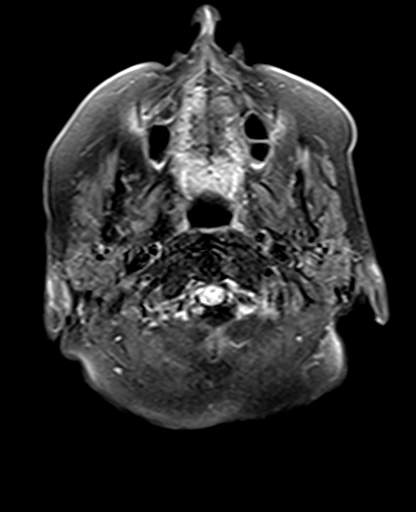
[im 25/25]
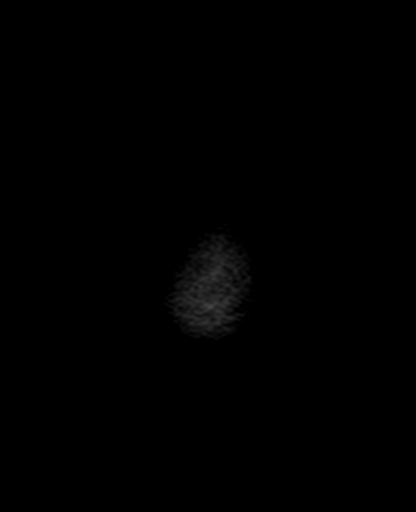

[Series 13: pha_images · axial · 3.0mm · 0.90mm/px · z∈[-94,+73]mm · 5 of 55 slices shown]
[im 1/55]
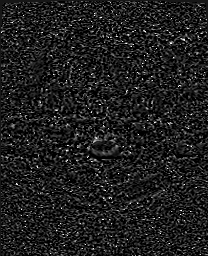
[im 14/55]
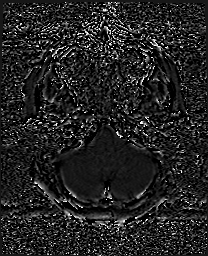
[im 28/55]
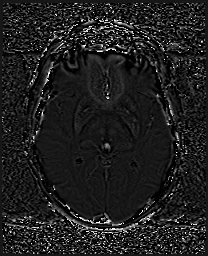
[im 41/55]
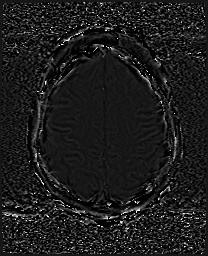
[im 55/55]
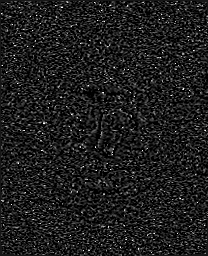

[Series 14: swi_images · axial · 3.0mm · 0.90mm/px · z∈[-94,+82]mm · 6 of 60 slices shown]
[im 1/60]
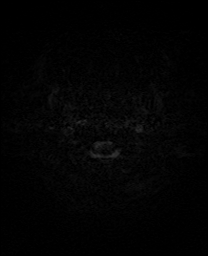
[im 12/60]
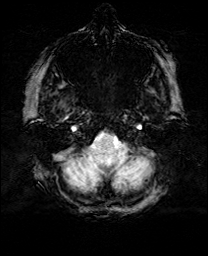
[im 24/60]
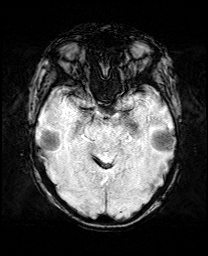
[im 36/60]
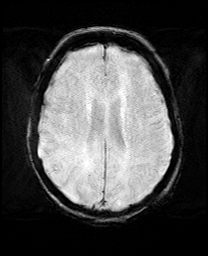
[im 48/60]
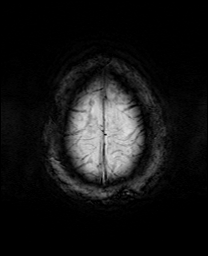
[im 60/60]
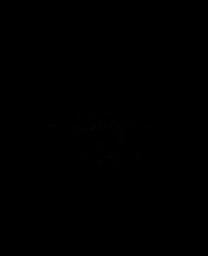

[Series 17: T2 · coronal · 5.0mm · 0.34mm/px · 3 of 29 slices shown (2 of 2)]
[im 1/29]
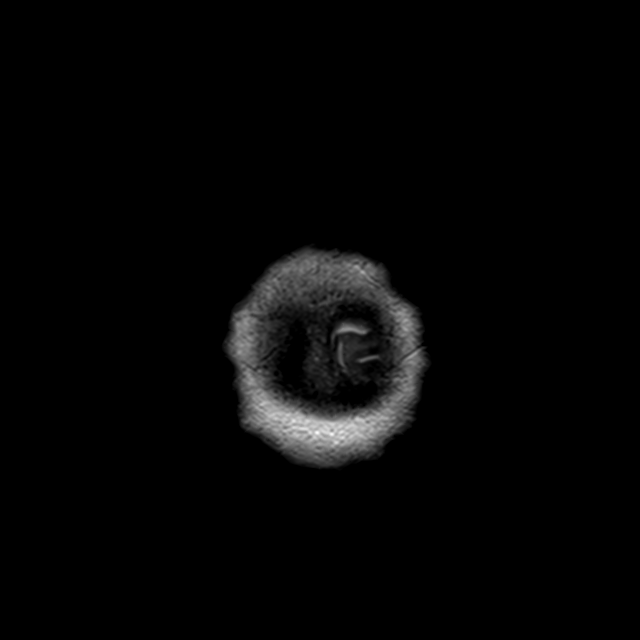
[im 15/29]
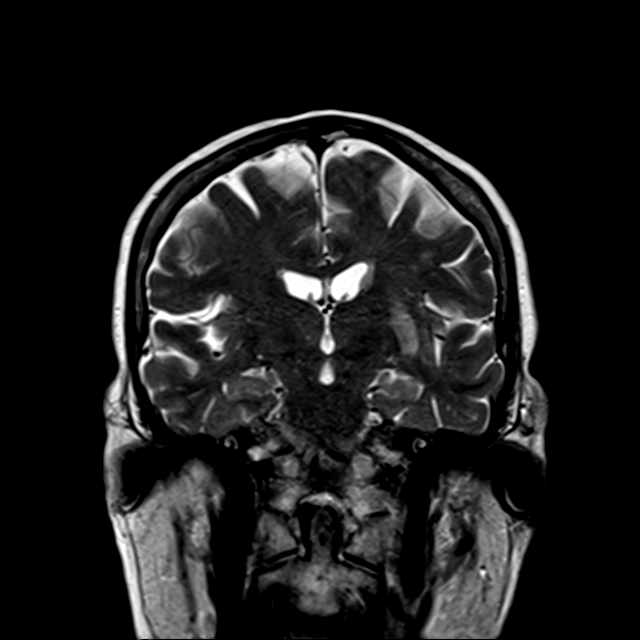
[im 29/29]
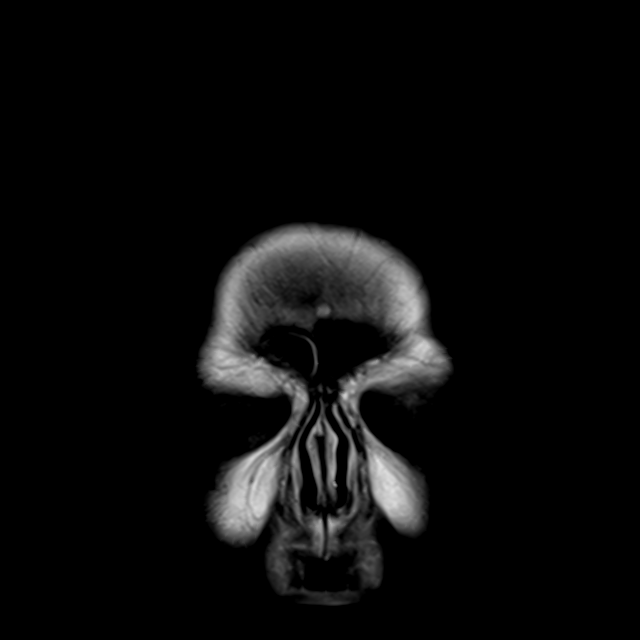

[42 of 48 positions shown; findings below may reference images not displayed]

FINDINGS: MRI HEAD FINDINGS

Brain: Acute ischemia in the left basal ganglia and insula. Focus of
chronic microhemorrhage in the left temporal lobe. Normal white
matter signal, parenchymal volume and CSF spaces. The midline
structures are normal.

Vascular: Major flow voids are preserved.

Skull and upper cervical spine: Normal calvarium and skull base.
Visualized upper cervical spine and soft tissues are normal.

Sinuses/Orbits:No paranasal sinus fluid levels or advanced mucosal
thickening. No mastoid or middle ear effusion. Normal orbits.

MRA HEAD FINDINGS

POSTERIOR CIRCULATION:

--Vertebral arteries: Normal

--Inferior cerebellar arteries: Normal.

--Basilar artery: Normal.

--Superior cerebellar arteries: Normal.

--Posterior cerebral arteries: Moderate stenosis of both proximal P1
segments.

ANTERIOR CIRCULATION:

--Intracranial internal carotid arteries: Normal.

--Anterior cerebral arteries (ACA): Normal.

--Middle cerebral arteries (MCA): Normal.

ANATOMIC VARIANTS: None
IMPRESSION: 1. Acute ischemia in the left basal ganglia and insula. No
hemorrhage or mass effect.
2. Moderate stenosis of both proximal P1 segments.

## 2021-09-24 IMAGING — MR MR MRA HEAD W/O CM
1 series · 19 of 48 positions shown · non-contrast
Comparison: No pertinent prior exam.

CLINICAL DATA: Stroke follow-up

EXAM:
MRI HEAD WITHOUT CONTRAST
MRA HEAD WITHOUT CONTRAST
TECHNIQUE: Multiplanar, multi-echo pulse sequences of the brain and surrounding
structures were acquired without intravenous contrast. Angiographic
images of the Circle of Willis were acquired using MRA technique
without intravenous contrast.

[Series 5: 3d cow · axial · 0.5mm · 0.41mm/px · z∈[-78,-2]mm · 19 of 160 slices shown]
[im 1/160]
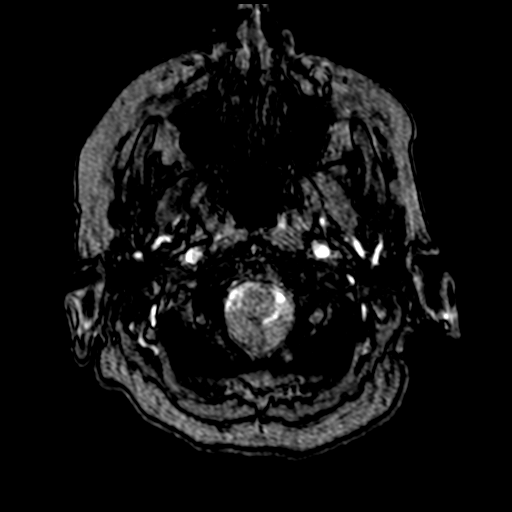
[im 4/160]
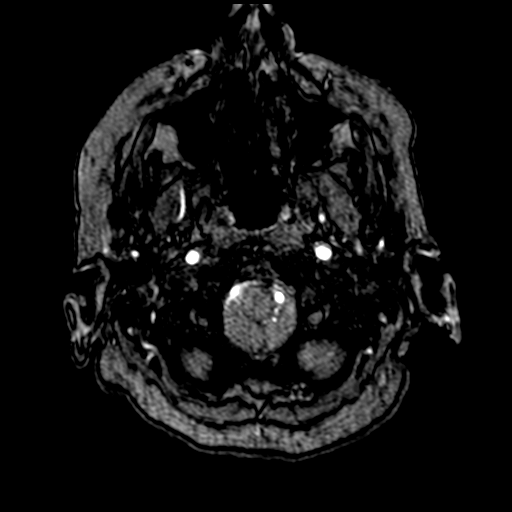
[im 7/160]
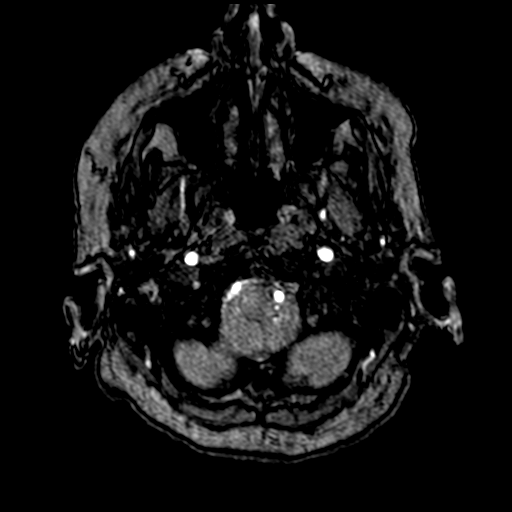
[im 11/160]
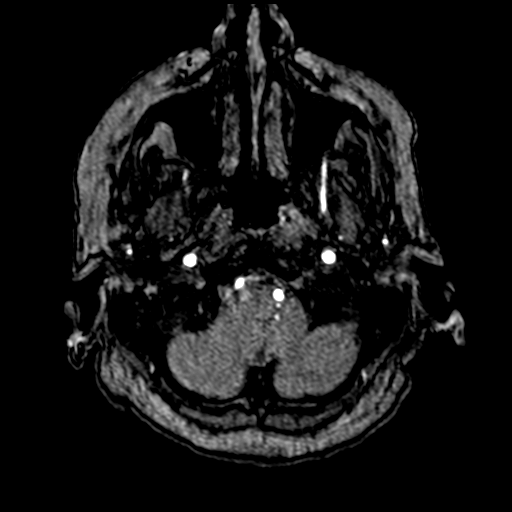
[im 14/160]
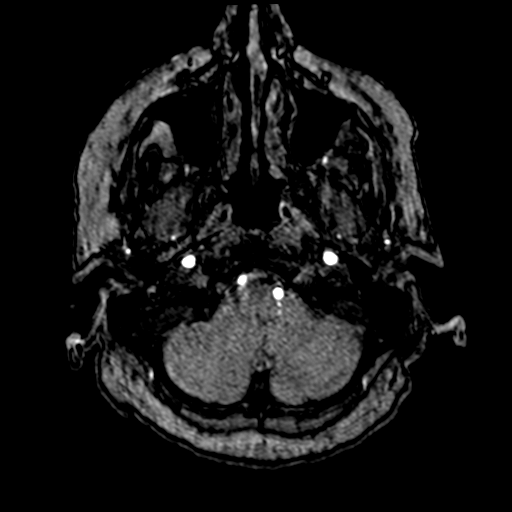
[im 17/160]
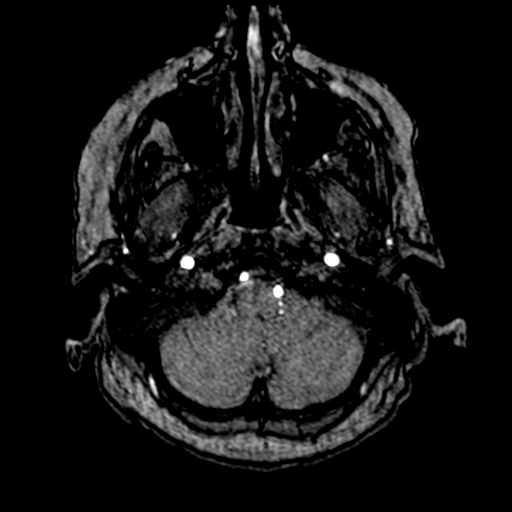
[im 21/160]
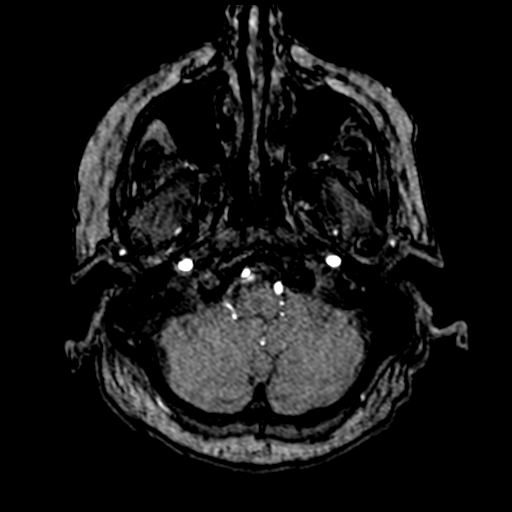
[im 24/160]
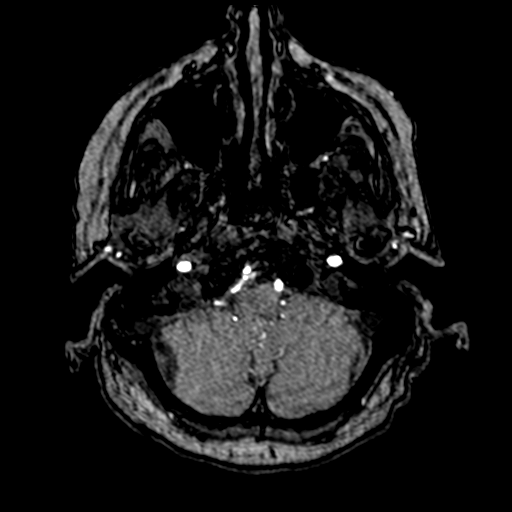
[im 28/160]
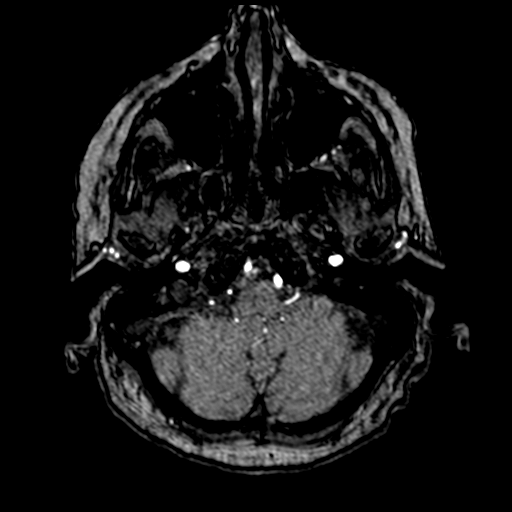
[im 31/160]
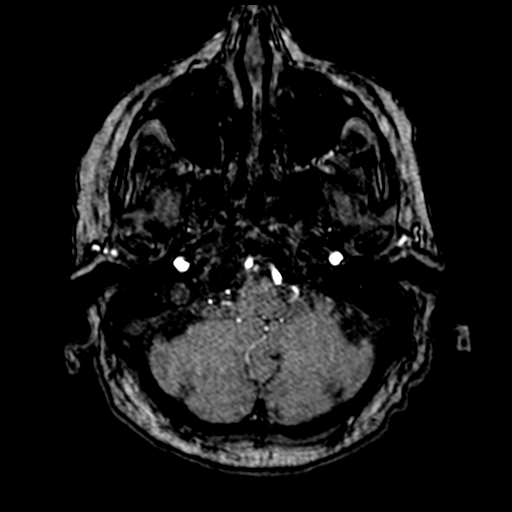
[im 34/160]
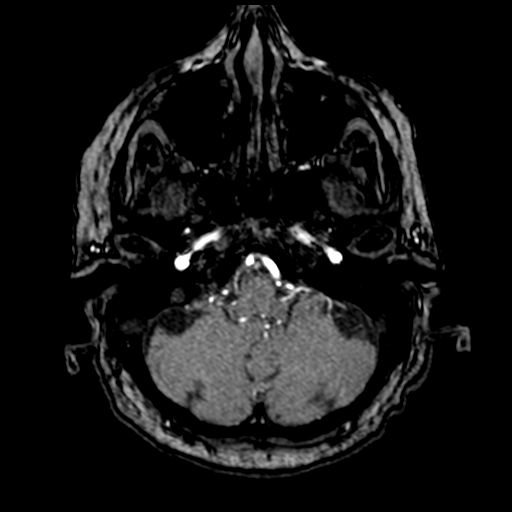
[im 51/160]
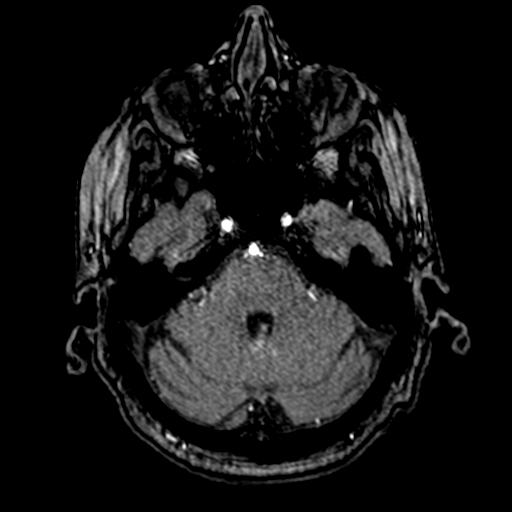
[im 72/160]
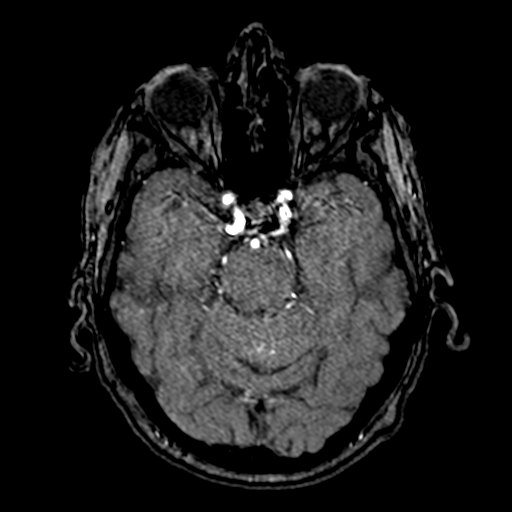
[im 82/160]
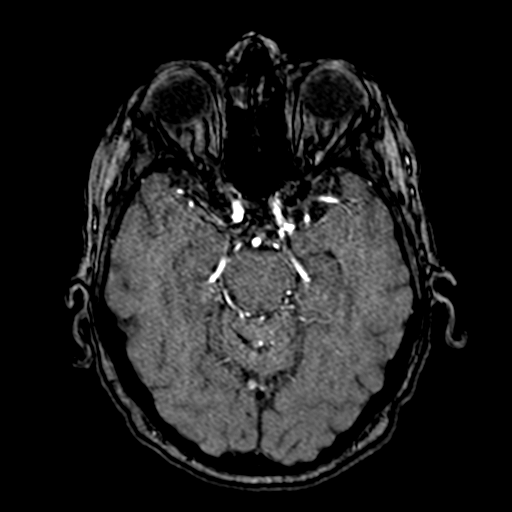
[im 92/160]
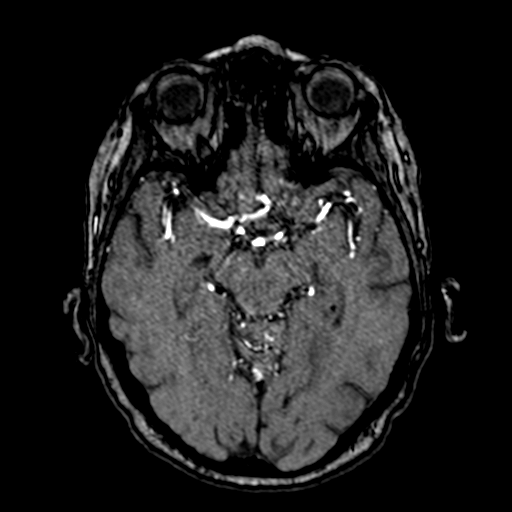
[im 112/160]
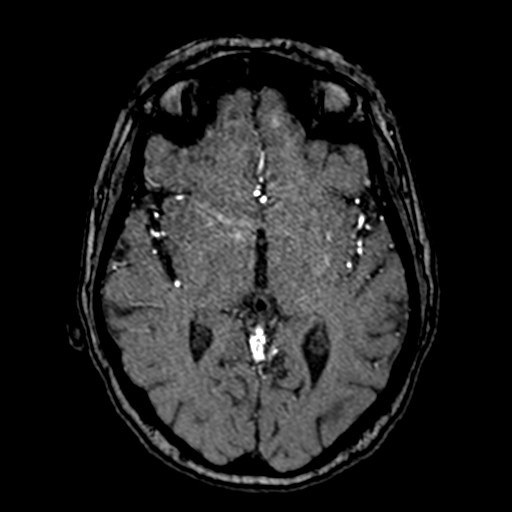
[im 132/160]
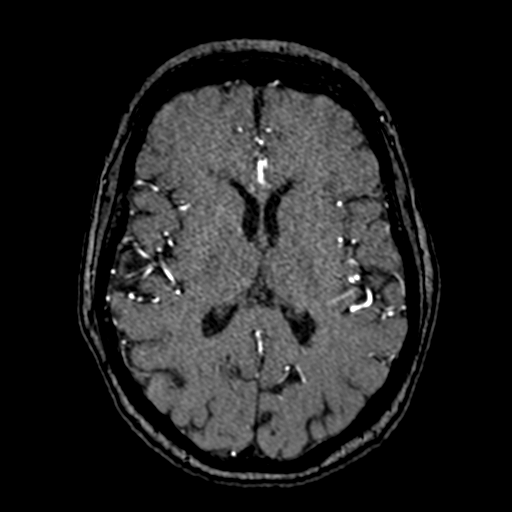
[im 136/160]
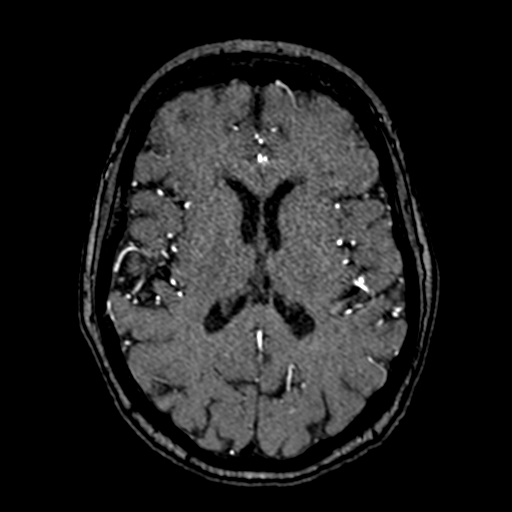
[im 153/160]
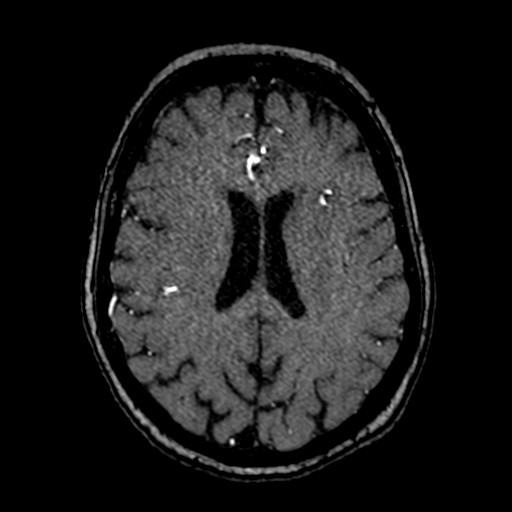

[19 of 48 positions shown; findings below may reference images not displayed]

FINDINGS: MRI HEAD FINDINGS

Brain: Acute ischemia in the left basal ganglia and insula. Focus of
chronic microhemorrhage in the left temporal lobe. Normal white
matter signal, parenchymal volume and CSF spaces. The midline
structures are normal.

Vascular: Major flow voids are preserved.

Skull and upper cervical spine: Normal calvarium and skull base.
Visualized upper cervical spine and soft tissues are normal.

Sinuses/Orbits:No paranasal sinus fluid levels or advanced mucosal
thickening. No mastoid or middle ear effusion. Normal orbits.

MRA HEAD FINDINGS

POSTERIOR CIRCULATION:

--Vertebral arteries: Normal

--Inferior cerebellar arteries: Normal.

--Basilar artery: Normal.

--Superior cerebellar arteries: Normal.

--Posterior cerebral arteries: Moderate stenosis of both proximal P1
segments.

ANTERIOR CIRCULATION:

--Intracranial internal carotid arteries: Normal.

--Anterior cerebral arteries (ACA): Normal.

--Middle cerebral arteries (MCA): Normal.

ANATOMIC VARIANTS: None
IMPRESSION: 1. Acute ischemia in the left basal ganglia and insula. No
hemorrhage or mass effect.
2. Moderate stenosis of both proximal P1 segments.

## 2021-09-24 MED ORDER — SODIUM CHLORIDE 0.9 % IV SOLN
250.0000 mL | INTRAVENOUS | Status: DC
Start: 2021-09-24 — End: 2021-09-25
  Administered 2021-09-24: 250 mL via INTRAVENOUS

## 2021-09-24 MED ORDER — MIDODRINE HCL 5 MG PO TABS
10.0000 mg | ORAL_TABLET | Freq: Three times a day (TID) | ORAL | Status: DC
  Administered 2021-09-24 – 2021-09-25 (×5): 10 mg via ORAL
  Filled 2021-09-24 (×5): qty 2

## 2021-09-24 MED ORDER — DOCUSATE SODIUM 100 MG PO CAPS
100.0000 mg | ORAL_CAPSULE | Freq: Two times a day (BID) | ORAL | Status: DC
  Administered 2021-09-24: 100 mg via ORAL
  Filled 2021-09-24 (×2): qty 1

## 2021-09-24 NOTE — Progress Notes (Addendum)
STROKE TEAM PROGRESS NOTE  ? ?SUBJECTIVE (INTERVAL HISTORY) ?Her niece and nephew are at the bedside.  Patient sitting in chair, right facial droop much improved.  Otherwise neuro stable.  However she still on Neo for BP management.  Per RN, off neo, patient BP down to 80s.  Added midodrine yesterday, will increase dose to 10 mg 3 times daily.  Taper off Neo as possible. Tele concerning for afib with brady, will do EKG.  ? ? ?OBJECTIVE ?Temp:  [97.5 ?F (36.4 ?C)-99.4 ?F (37.4 ?C)] 97.5 ?F (36.4 ?C) (03/19 1600) ?Pulse Rate:  [50-69] 52 (03/19 1635) ?Resp:  [12-29] 24 (03/19 1635) ?BP: (85-134)/(45-102) 89/75 (03/19 1635) ?SpO2:  [87 %-100 %] 94 % (03/19 1635) ? ?Recent Labs  ?Lab 09/22/21 ?1202  ?GLUCAP 115*  ? ?Recent Labs  ?Lab 09/22/21 ?1201 09/22/21 ?1208 09/22/21 ?1543 09/23/21 ?[id number] 09/24/21 ?0334  ?NA 138 138 139 140 139  ?K 4.4 4.4 4.5 4.3 4.0  ?CL 107 106  --  113* 112*  ?CO2 21*  --   --  19* 20*  ?GLUCOSE 116* 112*  --  106* 105*  ?BUN 20 23  --  13 15  ?CREATININE 1.47* 1.40*  --  1.21* 1.30*  ?CALCIUM 9.1  --   --  8.2* 8.1*  ? ?Recent Labs  ?Lab 09/22/21 ?1201  ?AST 15  ?ALT 14  ?ALKPHOS 56  ?BILITOT 0.5  ?PROT 6.7  ?ALBUMIN 3.8  ? ?Recent Labs  ?Lab 09/22/21 ?1201 09/22/21 ?1208 09/22/21 ?1543 09/23/21 ?[id number] 09/24/21 ?0334  ?WBC 13.4*  --   --  11.4* 9.0  ?NEUTROABS 11.6*  --   --  8.3*  --   ?HGB 12.5 12.9 10.5* 10.7* 9.3*  ?HCT 36.2 38.0 31.0* 33.1* 28.8*  ?MCV 95.8  --   --  98.5 98.3  ?PLT 193  --   --  222 144*  ? ?No results for input(s): CKTOTAL, CKMB, CKMBINDEX, TROPONINI in the last 168 hours. ?Recent Labs  ?  09/22/21 ?1201  ?LABPROT 13.1  ?INR 1.0  ? ?No results for input(s): COLORURINE, LABSPEC, PHURINE, GLUCOSEU, HGBUR, BILIRUBINUR, KETONESUR, PROTEINUR, UROBILINOGEN, NITRITE, LEUKOCYTESUR in the last 72 hours. ? ?Invalid input(s): APPERANCEUR  ?   ?Component Value Date/Time  ? CHOL 113 09/23/2021 0726  ? CHOL 165 03/02/2020 0837  ? TRIG 126 09/23/2021 0726  ? TRIG 127 09/23/2021 0726  ?  HDL 38 (L) 09/23/2021 0726  ? HDL 52 03/02/2020 0837  ? CHOLHDL 3.0 09/23/2021 0726  ? VLDL 25 09/23/2021 0726  ? LDLCALC 50 09/23/2021 0726  ? LDLCALC 89 03/02/2020 0837  ? ?Lab Results  ?Component Value Date  ? HGBA1C 5.3 09/23/2021  ? ?No results found for: LABOPIA, COCAINSCRNUR, LABBENZ, AMPHETMU, THCU, LABBARB  ?No results for input(s): ETH in the last 168 hours. ? ?I have personally reviewed the radiological images below and agree with the radiology interpretations. ? ?CT HEAD WO CONTRAST (5MM) ? ?Result Date: 09/23/2021 ?CLINICAL DATA:  Stroke, follow up EXAM: CT HEAD WITHOUT CONTRAST TECHNIQUE: Contiguous axial images were obtained from the base of the skull through the vertex without intravenous contrast. RADIATION DOSE REDUCTION: This exam was performed according to the departmental dose-optimization program which includes automated exposure control, adjustment of the mA and/or kV according to patient size and/or use of iterative reconstruction technique. COMPARISON:  CT head March 172023. FINDINGS: Brain: No evidence of acute large vascular territory infarction, hydrocephalus, extra-axial collection or mass lesion/mass effect.Small focus of hyperdensity in the left  sylvian fissure (for example see series 4, image 13 and series 5, image 32). Patchy white matter hypoattenuation, nonspecific but of chronic microvascular ischemic disease. Similar chronic infarct in the right frontal white matter. Vascular: No hyperdense vessel identified. Skull: No acute fracture. Sinuses/Orbits: Mild paranasal sinus mucosal thickening. No acute orbital findings. Other: No mastoid effusions. IMPRESSION: 1. Small focus of hyperdensity in the left sylvian fissure, which could represent small volume of subarachnoid hemorrhage versus contrast staining from recent catheter arteriogram. Recommend short interval follow-up CT to exclude progressive hemorrhage. 2. No clear evidence of acute large vascular territory infarct. MRI could  provide more sensitive evaluation for acute infarct if the patient is able. These results will be called to the ordering clinician or representative by the Radiologist Assistant, and communication documented in the PACS or [employer]. Electronically Signed   By: Feliberto Harts M.D.   On: 09/23/2021 19:06  ? ?MR ANGIO HEAD WO CONTRAST ? ?Result Date: 09/24/2021 ?CLINICAL DATA:  Stroke follow-up EXAM: MRI HEAD WITHOUT CONTRAST MRA HEAD WITHOUT CONTRAST TECHNIQUE: Multiplanar, multi-echo pulse sequences of the brain and surrounding structures were acquired without intravenous contrast. Angiographic images of the Circle of Willis were acquired using MRA technique without intravenous contrast. COMPARISON:  No pertinent prior exam. FINDINGS: MRI HEAD FINDINGS Brain: Acute ischemia in the left basal ganglia and insula. Focus of chronic microhemorrhage in the left temporal lobe. Normal white matter signal, parenchymal volume and CSF spaces. The midline structures are normal. Vascular: Major flow voids are preserved. Skull and upper cervical spine: Normal calvarium and skull base. Visualized upper cervical spine and soft tissues are normal. Sinuses/Orbits:No paranasal sinus fluid levels or advanced mucosal thickening. No mastoid or middle ear effusion. Normal orbits. MRA HEAD FINDINGS POSTERIOR CIRCULATION: --Vertebral arteries: Normal --Inferior cerebellar arteries: Normal. --Basilar artery: Normal. --Superior cerebellar arteries: Normal. 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 ANTERIOR CIRCULATION: --Intracranial internal carotid arteries: Normal. --Anterior cerebral arteries (ACA): Normal. --Middle cerebral arteries (MCA): Normal. ANATOMIC VARIANTS: None IMPRESSION: 1. Acute ischemia in the left basal ganglia and insula. No hemorrhage or mass effect. 2. 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 Electronically Signed   By: Deatra Robinson M.D.   On: 09/24/2021 01:27  ? ?MR  BRAIN WO CONTRAST ? ?Result Date: 09/24/2021 ?CLINICAL DATA:  Stroke follow-up EXAM: MRI HEAD WITHOUT CONTRAST MRA HEAD WITHOUT CONTRAST TECHNIQUE: Multiplanar, multi-echo pulse sequences of the brain and surrounding structures were acquired without intravenous contrast. Angiographic images of the Circle of Willis were acquired using MRA technique without intravenous contrast. COMPARISON:  No pertinent prior exam. FINDINGS: MRI HEAD FINDINGS Brain: Acute ischemia in the left basal ganglia and insula. Focus of chronic microhemorrhage in the left temporal lobe. Normal white matter signal, parenchymal volume and CSF spaces. The midline structures are normal. Vascular: Major flow voids are preserved. Skull and upper cervical spine: Normal calvarium and skull base. Visualized upper cervical spine and soft tissues are normal. Sinuses/Orbits:No paranasal sinus fluid levels or advanced mucosal thickening. No mastoid or middle ear effusion. Normal orbits. MRA HEAD FINDINGS POSTERIOR CIRCULATION: --Vertebral arteries: Normal --Inferior cerebellar arteries: Normal. --Basilar artery: Normal. --Superior cerebellar arteries: Normal. 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 ANTERIOR CIRCULATION: --Intracranial internal carotid arteries: Normal. --Anterior cerebral arteries (ACA): Normal. --Middle cerebral arteries (MCA): Normal. ANATOMIC VARIANTS: None IMPRESSION: 1. Acute ischemia in the left basal ganglia and insula. No hemorrhage or mass effect. 2. 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 Electronically Signed  By: Deatra Robinson M.D.   On: 09/24/2021 01:27  ? ?Portable Chest x-ray ? ?Result Date: 09/22/2021 ?CLINICAL DATA:  Code stroke. EXAM: PORTABLE CHEST 1 VIEW COMPARISON:  None FINDINGS: 2 portable supine radiographs of the chest. Endotracheal tube terminates 4.3 cm above carina. Mild cardiomegaly. Atherosclerosis in the transverse aorta. The left pleural space is poorly evaluated  secondary to overlying soft tissues. No right pleural fluid and no pneumothorax. No congestive failure. No lobar consolidation. Probable patchy left base airspace disease. IMPRESSION: Endotracheal tu

## 2021-09-25 ENCOUNTER — Encounter (HOSPITAL_COMMUNITY)
Admission: EM | Disposition: A | Discharge status: Home Health | Payer: Self-pay | Source: Home / Self Care | Attending: Neurology

## 2021-09-25 DIAGNOSIS — I639 Cerebral infarction, unspecified: Secondary | ICD-10-CM

## 2021-09-25 DIAGNOSIS — I959 Hypotension, unspecified: Secondary | ICD-10-CM

## 2021-09-25 HISTORY — PX: LOOP RECORDER INSERTION: EP1214

## 2021-09-25 LAB — CBC
HCT: 27.1 % — ABNORMAL LOW (ref 36.0–46.0)
Hemoglobin: 9.2 g/dL — ABNORMAL LOW (ref 12.0–15.0)
MCH: 32.9 pg (ref 26.0–34.0)
MCHC: 33.9 g/dL (ref 30.0–36.0)
MCV: 96.8 fL (ref 80.0–100.0)
Platelets: 120 10*3/uL — ABNORMAL LOW (ref 150–400)
RBC: 2.8 MIL/uL — ABNORMAL LOW (ref 3.87–5.11)
RDW: 13.7 % (ref 11.5–15.5)
WBC: 5.9 10*3/uL (ref 4.0–10.5)
nRBC: 0 % (ref 0.0–0.2)

## 2021-09-25 LAB — BASIC METABOLIC PANEL
Anion gap: 7 (ref 5–15)
BUN: 19 mg/dL (ref 8–23)
CO2: 21 mmol/L — ABNORMAL LOW (ref 22–32)
Calcium: 8.3 mg/dL — ABNORMAL LOW (ref 8.9–10.3)
Chloride: 109 mmol/L (ref 98–111)
Creatinine, Ser: 1.19 mg/dL — ABNORMAL HIGH (ref 0.44–1.00)
GFR, Estimated: 46 mL/min — ABNORMAL LOW (ref >60)
Glucose, Bld: 89 mg/dL (ref 70–99)
Potassium: 4 mmol/L (ref 3.5–5.1)
Sodium: 137 mmol/L (ref 135–145)

## 2021-09-25 SURGERY — LOOP RECORDER INSERTION
Anesthesia: LOCAL

## 2021-09-25 MED ORDER — ASPIRIN 81 MG PO TBEC: 81.0000 mg | DELAYED_RELEASE_TABLET | Freq: Every day | ORAL | 11 refills | Status: DC

## 2021-09-25 MED ORDER — LIDOCAINE-EPINEPHRINE 1 %-1:100000 IJ SOLN
INTRAMUSCULAR | Status: AC
  Filled 2021-09-25: qty 1

## 2021-09-25 MED ORDER — MIDODRINE HCL 10 MG PO TABS: 10.0000 mg | ORAL_TABLET | Freq: Three times a day (TID) | ORAL | 1 refills | Status: DC

## 2021-09-25 MED ORDER — CLOPIDOGREL BISULFATE 75 MG PO TABS: 75.0000 mg | ORAL_TABLET | Freq: Every day | ORAL | 0 refills | Status: DC

## 2021-09-25 MED ORDER — LIDOCAINE-EPINEPHRINE 1 %-1:100000 IJ SOLN
INTRAMUSCULAR | Status: DC | PRN
  Administered 2021-09-25: 10 mL

## 2021-09-25 SURGICAL SUPPLY — 2 items
MONITOR CARDIAC LUX-DX INSERT (Prosthesis & Implant Heart) ×1 IMPLANT
PACK LOOP INSERTION (CUSTOM PROCEDURE TRAY) ×2 IMPLANT

## 2021-09-25 NOTE — Discharge Summary (Addendum)
Patient ID: Mary Ortiz    l ?  MRN: [id number]    ?  DOB: 02/07/1941 ? ?Date of Admission: 09/22/2021 ?Date of Discharge: 09/25/2021 ? ?Attending Physician:  Stroke, Md, MD, Stroke MD ?Consultant(s):    Critical Care Medicine - Hunsucker, Lesia SagoMatthew R, MD ; Cardiology - Lanier PrudeLambert, Cameron T, MD ; Interventional Radiology - Julieanne Cottoneveshwar, Sanjeev, MD ?Patient's PCP:  Lorenda IshiharaVaradarajan, Rupashree, MD ? 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 ?Principal Problem: ?  Stroke (cerebrum) (HCC) ?Active Problems: ?  Middle cerebral artery embolism, left ? ? ?Past Medical History:  ?Diagnosis Date  ? Carotid artery stenosis   ? HTN (hypertension) 01/23/2019  ? Hyperlipidemia   ? ?Past Surgical History:  ?Procedure Laterality Date  ? BACK SURGERY    ? 1977  ? CATARACT EXTRACTION, BILATERAL    ? 2019, 2016  ? Hystertectomy  1983  ? IR CT HEAD LTD  09/22/2021  ? IR PERCUTANEOUS ART THROMBECTOMY/INFUSION INTRACRANIAL INC DIAG ANGIO  09/22/2021  ? RADIOLOGY WITH ANESTHESIA N/A 09/22/2021  ? Procedure: IR WITH ANESTHESIA;  Surgeon: Radiologist, Medication, MD;  Location: [facility] OR;  Service: Radiology;  Laterality: N/A;  ? ? ?Family History ?Family History  ?Problem Relation Age of Onset  ? Heart attack Mother   ? Heart attack Father   ? Heart attack Brother   ? Suicidality Sister   ? Liver cancer Brother   ? Kidney disease Brother   ? ? ?Social History ? reports that she has been smoking cigarettes. She has been smoking an average of .25 packs per day. She has never used smokeless tobacco. She reports that she does not drink alcohol and does not use drugs. ? ?Allergies as of 09/25/2021   ? ?   Reactions  ? Meperidine Hcl Nausea Only  ? Penicillins Other (See Comments)  ? Pt states ineffective  ? Neosporin Plus Max St Rash   ? Tetanus-diphtheria Toxoids Td Rash  ? ?  ? ?  ?Medication List  ?  ? ?STOP taking these medications   ? ?amLODipine 10 MG tablet ?Commonly known as: NORVASC ?  ?furosemide 20 MG tablet ?Commonly known as: LASIX ?  ?meclizine 12.5 MG tablet ?Commonly known as: ANTIVERT ?  ?olmesartan 5 MG tablet ?Commonly known as: BENICAR ?  ? ?  ? ?TAKE these medications   ? ?acetaminophen 500 MG tablet ?Commonly known as: TYLENOL ?Take 500 mg by mouth every 6 (six) hours as needed for mild pain. ?  ?aspirin 81 MG EC tablet ?Take 1 tablet (81 mg total) by mouth daily. Swallow whole. ?Start taking on: September 26, 2021 ?  ?clopidogrel 75 MG tablet ?Commonly known as: PLAVIX ?Take 1 tablet (75 mg total) by mouth daily for 21 days. ?Start taking on: September 26, 2021 ?  ?midodrine 10 MG tablet ?Commonly known as: PROAMATINE ?Take 1 tablet (10 mg total) by mouth 3 (three) times daily with meals. ?  ?Vitamin D3 50 MCG (2000 UT) Tabs ?Take 2,000 Units by mouth daily. ?  ? ?  ? ? ?HOME MEDICATIONS PRIOR TO ADMISSION ?Medications Prior to Admission  ?Medication Sig Dispense Refill  ? acetaminophen (TYLENOL) 500 MG tablet Take 500 mg by mouth every 6 (six) hours as needed for mild pain.    ? amLODipine (NORVASC) 10 MG tablet TAKE 1 TABLET  BY MOUTH ONCE DAILY. PLEASE SCHEDULE APPOINTMENT (Patient taking differently: Take 10 mg by mouth daily.) 90 tablet 0  ? Cholecalciferol (VITAMIN D3) 50 MCG (2000 UT) TABS Take 2,000 Units by mouth daily.    ? furosemide (LASIX) 20 MG tablet Take 1 tablet by mouth once daily (Patient taking differently: Take 20 mg by mouth daily.) 90 tablet 0  ? meclizine (ANTIVERT) 12.5 MG tablet Take 12.5 mg by mouth every 8 (eight) hours as needed for dizziness.    ? olmesartan (BENICAR) 5 MG tablet Take 5 mg by mouth daily.    ? ? ? ?HOSPITAL MEDICATIONS ? aspirin EC  81 mg Oral Daily  ? Chlorhexidine Gluconate Cloth  6 each Topical Q0600  ? clopidogrel  75 mg Oral Daily  ? docusate sodium  100 mg Oral BID  ? enoxaparin  (LOVENOX) injection  40 mg Subcutaneous Q24H  ? midodrine  10 mg Oral TID WC  ? nitroGLYCERIN  1 inch Topical Q8H  ? pantoprazole  40 mg Oral Daily  ? sodium chloride flush  3 mL Intravenous Once  ? ? ?LABORATORY STUDIES ?CBC ?   ?Component Value Date/Time  ? WBC 5.9 09/25/2021 0542  ? RBC 2.80 (L) 09/25/2021 0542  ? HGB 9.2 (L) 09/25/2021 0542  ? HCT 27.1 (L) 09/25/2021 0542  ? PLT 120 (L) 09/25/2021 0542  ? MCV 96.8 09/25/2021 0542  ? MCH 32.9 09/25/2021 0542  ? MCHC 33.9 09/25/2021 0542  ? RDW 13.7 09/25/2021 0542  ? LYMPHSABS 2.0 09/23/2021 0726  ? MONOABS 1.0 09/23/2021 0726  ? EOSABS 0.0 09/23/2021 0726  ? BASOSABS 0.1 09/23/2021 0726  ? ?CMP ?   ?Component Value Date/Time  ? NA 137 09/25/2021 0542  ? K 4.0 09/25/2021 0542  ? CL 109 09/25/2021 0542  ? CO2 21 (L) 09/25/2021 0542  ? GLUCOSE 89 09/25/2021 0542  ? BUN 19 09/25/2021 0542  ? CREATININE 1.19 (H) 09/25/2021 0542  ? CALCIUM 8.3 (L) 09/25/2021 0542  ? PROT 6.7 09/22/2021 1201  ? ALBUMIN 3.8 09/22/2021 1201  ? AST 15 09/22/2021 1201  ? ALT 14 09/22/2021 1201  ? ALKPHOS 56 09/22/2021 1201  ? BILITOT 0.5 09/22/2021 1201  ? GFRNONAA 46 (L) 09/25/2021 0542  ? ?COAGS ?Lab Results  ?Component Value Date  ? INR 1.0 09/22/2021  ? ?Lipid Panel ?   ?Component Value Date/Time  ? CHOL 113 09/23/2021 0726  ? CHOL 165 03/02/2020 0837  ? TRIG 126 09/23/2021 0726  ? TRIG 127 09/23/2021 0726  ? HDL 38 (L) 09/23/2021 0726  ? HDL 52 03/02/2020 0837  ? CHOLHDL 3.0 09/23/2021 0726  ? VLDL 25 09/23/2021 0726  ? LDLCALC 50 09/23/2021 0726  ? LDLCALC 89 03/02/2020 0837  ? ?HgbA1C  ?Lab Results  ?Component Value Date  ? HGBA1C 5.3 09/23/2021  ? ?Urinalysis ?No results found for: COLORURINE, APPEARANCEUR, LABSPEC, PHURINE, GLUCOSEU, HGBUR, BILIRUBINUR, KETONESUR, PROTEINUR, UROBILINOGEN, NITRITE, LEUKOCYTESUR ?Urine Drug Screen No results found for: LABOPIA, COCAINSCRNUR, LABBENZ, AMPHETMU, THCU, LABBARB  ?Alcohol Level ?No results found for: ETH ? ? ?SIGNIFICANT DIAGNOSTIC  STUDIES ? ?CT HEAD WO CONTRAST ( ) ? ?Result Date: 09/23/2021 ?CLINICAL DATA:  Stroke, follow up EXAM: CT HEAD WITHOUT CONTRAST TECHNIQUE: Contiguous axial images were obtained from the base of the skull through the vertex without intravenous contrast. RADIATION DOSE REDUCTION: This exam was performed according to the departmental dose-optimization program which includes automated exposure control, adjustment of the mA and/or kV according to patient size and/or use of  iterative reconstruction technique. COMPARISON:  CT head March 172023. FINDINGS: Brain: No evidence of acute large vascular territory infarction, hydrocephalus, extra-axial collection or mass lesion/mass effect.Small focus of hyperdensity in the left sylvian fissure (for example see series 4, image 13 and series 5, image 32). Patchy white matter hypoattenuation, nonspecific but of chronic microvascular ischemic disease. Similar chronic infarct in the right frontal white matter. Vascular: No hyperdense vessel identified. Skull: No acute fracture. Sinuses/Orbits: Mild paranasal sinus mucosal thickening. No acute orbital findings. Other: No mastoid effusions. IMPRESSION: 1. Small focus of hyperdensity in the left sylvian fissure, which could represent small volume of subarachnoid hemorrhage versus contrast staining from recent catheter arteriogram. Recommend short interval follow-up CT to exclude progressive hemorrhage. 2. No clear evidence of acute large vascular territory infarct. MRI could provide more sensitive evaluation for acute infarct if the patient is able. These results will be called to the ordering clinician or representative by the Radiologist Assistant, and communication documented in the PACS or [employer]. Electronically Signed   By: Feliberto Harts M.D.   On: 09/23/2021 19:06  ? ?MR ANGIO HEAD WO CONTRAST ? ?Result Date: 09/24/2021 ?CLINICAL DATA:  Stroke follow-up EXAM: MRI HEAD WITHOUT CONTRAST MRA HEAD WITHOUT CONTRAST  TECHNIQUE: Multiplanar, multi-echo pulse sequences of the brain and surrounding structures were acquired without intravenous contrast. Angiographic images of the Circle of Willis were acquired using MRA technique w

## 2021-09-25 NOTE — Consult Note (Signed)
? ? ?ELECTROPHYSIOLOGY CONSULT NOTE  ?Patient ID: Mary Ortiz ?MRN: [id number], DOB/AGE: 1940-11-07  ? ?Admit date: 09/22/2021 ?Date of Consult: 09/25/2021 ? ?Primary Physician: Leeroy Cha, MD ?Primary Cardiologist: None  ?Primary Electrophysiologist: New to None  ?Reason for Consultation: Cryptogenic stroke; recommendations regarding Implantable Loop Recorder ?Insurance: [facility] Medicare ? ?History of Present Illness ?EP has been asked to evaluate Mary Ortiz for placement of an implantable loop recorder to monitor for atrial fibrillation by Dr Erlinda Hong.  The patient was admitted on 09/22/2021 with R facial droop, R sided weakness, and aphasia.   ? ?Imaging demonstrated left MCA infarct.  Out of TPA window, underwent mechanical thrombectomy with good results.  ? ?She has undergone workup for stroke:  ? ?Stroke:  left MCA infarct due to left M1 occlusion status post IR with TICI3, embolic pattern source unclear, concerning for occult A-fib ?CT no acute abnormality ?CT head and neck left M1 occlusion.  Bilateral carotid bulb atherosclerosis but less than 50% stenosis ?CTP 30 cc core, 70 cc perfusion deficit ?CT repeat no acute infarct ?MRI left insular cortex, BG and CR small infarcts ?MRA left M1 patent, bilateral proximal P1 moderate stenosis. ?2D Echo EF 60 to 65% ?LE venous Doppler no DVT ?LDL 50 ?HgbA1c 5.3 ?Lovenox for VTE prophylaxis ?No antithrombotic prior to admission, now on aspirin 81 mg daily and clopidogrel 75 mg daily.  Continue DAPT for 3 weeks and then aspirin alone. ?Ongoing aggressive stroke risk factor management ?Therapy recommendations: HH PT/OT ?Disposition: Home today ? ? ?The patient has been monitored on telemetry which has demonstrated sinus rhythm with relatively frequent PACs and low p wave amplitude.  Inpatient stroke work-up will not require a TEE per Neurology.  ? ?Echocardiogram as above. Lab work is reviewed. ? ?Prior to admission, the patient denies chest pain, shortness of  breath, dizziness, palpitations, or syncope.  She is recovering from her stroke with plans to return home  at discharge. ? ?Past Medical History:  ?Diagnosis Date  ? Carotid artery stenosis   ? HTN (hypertension) 01/23/2019  ? Hyperlipidemia   ?  ? ?Surgical History:  ?Past Surgical History:  ?Procedure Laterality Date  ? BACK SURGERY    ? 1977  ? CATARACT EXTRACTION, BILATERAL    ? 2019, 2016  ? Hystertectomy  1983  ? IR CT HEAD LTD  09/22/2021  ? IR PERCUTANEOUS ART THROMBECTOMY/INFUSION INTRACRANIAL INC DIAG ANGIO  09/22/2021  ?  ? ?Medications Prior to Admission  ?Medication Sig Dispense Refill Last Dose  ? acetaminophen (TYLENOL) 500 MG tablet Take 500 mg by mouth every 6 (six) hours as needed for mild pain.   unk  ? amLODipine (NORVASC) 10 MG tablet TAKE 1 TABLET BY MOUTH ONCE DAILY. PLEASE SCHEDULE APPOINTMENT (Patient taking differently: Take 10 mg by mouth daily.) 90 tablet 0 Past Week  ? Cholecalciferol (VITAMIN D3) 50 MCG (2000 UT) TABS Take 2,000 Units by mouth daily.   Past Week  ? furosemide (LASIX) 20 MG tablet Take 1 tablet by mouth once daily (Patient taking differently: Take 20 mg by mouth daily.) 90 tablet 0 Past Week  ? meclizine (ANTIVERT) 12.5 MG tablet Take 12.5 mg by mouth every 8 (eight) hours as needed for dizziness.   unk  ? olmesartan (BENICAR) 5 MG tablet Take 5 mg by mouth daily.   Past Week  ? ? ?Inpatient Medications:  ? aspirin EC  81 mg Oral Daily  ? Chlorhexidine Gluconate Cloth  6 each Topical Q0600  ?  clopidogrel  75 mg Oral Daily  ? docusate sodium  100 mg Oral BID  ? enoxaparin (LOVENOX) injection  40 mg Subcutaneous Q24H  ? midodrine  10 mg Oral TID WC  ? nitroGLYCERIN  1 inch Topical Q8H  ? pantoprazole  40 mg Oral Daily  ? sodium chloride flush  3 mL Intravenous Once  ? ? ?Allergies:  ?Allergies  ?Allergen Reactions  ? Meperidine Hcl Nausea Only  ? Penicillins Other (See Comments)  ?  Pt states ineffective  ? Neosporin Plus Max St Rash  ? Tetanus-Diphtheria Toxoids Td Rash   ? ? ?Social History  ? ?Socioeconomic History  ? Marital status: Widowed  ?  Spouse name: Not on file  ? Number of children: 0  ? Years of education: Not on file  ? Highest education level: Not on file  ?Occupational History  ? Not on file  ?Tobacco Use  ? Smoking status: Every Day  ?  Packs/day: 0.25  ?  Types: Cigarettes  ? Smokeless tobacco: Never  ? Tobacco comments:  ?  smokes 3 packs a week  ?Vaping Use  ? Vaping Use: Never used  ?Substance and Sexual Activity  ? Alcohol use: Never  ? Drug use: Never  ? Sexual activity: Not on file  ?Other Topics Concern  ? Not on file  ?Social History Narrative  ? Not on file  ? ?Social Determinants of Health  ? ?Financial Resource Strain: Not on file  ?Food Insecurity: Not on file  ?Transportation Needs: Not on file  ?Physical Activity: Not on file  ?Stress: Not on file  ?Social Connections: Not on file  ?Intimate Partner Violence: Not on file  ?  ? ?Family History  ?Problem Relation Age of Onset  ? Heart attack Mother   ? Heart attack Father   ? Heart attack Brother   ? Suicidality Sister   ? Liver cancer Brother   ? Kidney disease Brother   ?  ? ? ?Review of Systems: ?All other systems reviewed and are otherwise negative except as noted above. ? ?Physical Exam: ?Vitals:  ? 09/25/21 1130 09/25/21 1159 09/25/21 1200 09/25/21 1230  ?BP: (!) 124/57  (!) 105/93 (!) 105/93  ?Pulse: (!) 52  72 (!) 49  ?Resp: 17  (!) 26 18  ?Temp:  98.1 ?F (36.7 ?C)    ?TempSrc:  Oral    ?SpO2: 95%  98%   ?Weight:      ?Height:      ? ? ?GEN- The patient is well appearing, alert and oriented x 3 today.   ?Head- normocephalic, atraumatic ?Eyes-  Sclera clear, conjunctiva pink ?Ears- hearing intact ?Oropharynx- clear ?Neck- supple ?Lungs- Clear to ausculation bilaterally, normal work of breathing ?Heart- Regular rate and rhythm, no murmurs, rubs or gallops  ?GI- soft, NT, ND, + BS ?Extremities- no clubbing, cyanosis, or edema ?MS- no significant deformity or atrophy ?Skin- no rash or lesion ?Psych-  euthymic mood, full affect ?Neuro- Moves all 4 ? ? ?Labs: ?  ?Lab Results  ?Component Value Date  ? WBC 5.9 09/25/2021  ? HGB 9.2 (L) 09/25/2021  ? HCT 27.1 (L) 09/25/2021  ? MCV 96.8 09/25/2021  ? PLT 120 (L) 09/25/2021  ?  ?Recent Labs  ?Lab 09/22/21 ?1201 09/22/21 ?1208 09/25/21 ?0542  ?NA 138   < > 137  ?K 4.4   < > 4.0  ?CL 107   < > 109  ?CO2 21*   < > 21*  ?BUN 20   < >  19  ?CREATININE 1.47*   < > 1.19*  ?CALCIUM 9.1   < > 8.3*  ?PROT 6.7  --   --   ?BILITOT 0.5  --   --   ?ALKPHOS 56  --   --   ?ALT 14  --   --   ?AST 15  --   --   ?GLUCOSE 116*   < > 89  ? < > = values in this interval not displayed.  ? ?  ?Radiology/Studies: CT HEAD WO CONTRAST (5MM) ? ?Result Date: 09/23/2021 ?CLINICAL DATA:  Stroke, follow up EXAM: CT HEAD WITHOUT CONTRAST TECHNIQUE: Contiguous axial images were obtained from the base of the skull through the vertex without intravenous contrast. RADIATION DOSE REDUCTION: This exam was performed according to the departmental dose-optimization program which includes automated exposure control, adjustment of the mA and/or kV according to patient size and/or use of iterative reconstruction technique. COMPARISON:  CT head March 172023. FINDINGS: Brain: No evidence of acute large vascular territory infarction, hydrocephalus, extra-axial collection or mass lesion/mass effect.Small focus of hyperdensity in the left sylvian fissure (for example see series 4, image 13 and series 5, image 32). Patchy white matter hypoattenuation, nonspecific but of chronic microvascular ischemic disease. Similar chronic infarct in the right frontal white matter. Vascular: No hyperdense vessel identified. Skull: No acute fracture. Sinuses/Orbits: Mild paranasal sinus mucosal thickening. No acute orbital findings. Other: No mastoid effusions. IMPRESSION: 1. Small focus of hyperdensity in the left sylvian fissure, which could represent small volume of subarachnoid hemorrhage versus contrast staining from recent  catheter arteriogram. Recommend short interval follow-up CT to exclude progressive hemorrhage. 2. No clear evidence of acute large vascular territory infarct. MRI could provide more sensitive evaluation for acut

## 2021-09-25 NOTE — Progress Notes (Signed)
Physical Therapy Treatment ?Patient Details ?Name: Mary Ortiz ?MRN: [id number] ?DOB: August 07, 1940 ?Today's Date: 09/25/2021 ? ? ?History of Present Illness Pt is 81 yo female who presents with aphasia, R side weakness, found on floor by EMS. CT showed L CVA and pt underwent revascularization 3/17. PMH: HTN, carotid stenosis. ? ?  ?PT Comments  ? ? Focus of session today was bed mobility, functional transfers, ambulation, and dynamic balance challenges. The patient tolerated well.  Pt. Shows overall improvement with her static and dynamic balance, her gait toleration, and functional mobility. High level balance, coordination, and strength are still limiting function. Pt. Would benefit from skilled PT to continue to address her functional mobility during transfers, high level dynamic, and reactive balance. Plan and discharge setting remains unchanged. Pt to follow acutely as appropriate.  ?   ?Recommendations for follow up therapy are one component of a multi-disciplinary discharge planning process, led by the attending physician.  Recommendations may be updated based on patient status, additional functional criteria and insurance authorization. ? ?Follow Up Recommendations ? Home health PT ?  ?  ?Assistance Recommended at Discharge Intermittent Supervision/Assistance  ?Patient can return home with the following A little help with walking and/or transfers;Assistance with cooking/housework;Assist for transportation ?  ?Equipment Recommendations ? None recommended by PT  ?  ?Recommendations for Other Services   ? ? ?  ?Precautions / Restrictions Precautions ?Precautions: Fall ?Precaution Comments: watch BP ?Restrictions ?Weight Bearing Restrictions: No  ?  ? ?Mobility ? Bed Mobility ?Overal bed mobility: Needs Assistance ?Bed Mobility: Supine to Sit ?  ?  ?Supine to sit: Supervision ?  ?  ?General bed mobility comments: Supervision for lines management ?  ? ?Transfers ?Overall transfer level: Needs assistance ?   ?Transfers: Sit to/from Stand ?Sit to Stand: Min guard ?  ?  ?  ?  ?  ?General transfer comment: Min guard for saftey ?  ? ?Ambulation/Gait ?Ambulation/Gait assistance: Min guard ?Gait Distance (Feet): 150 Feet ?Assistive device: None ?Gait Pattern/deviations: Step-through pattern, Decreased stride length ?Gait velocity: decreased ?  ?  ?General Gait Details: Pt. shows good control throughout gait and throughout balance challenges with no note LOB ? ? ?Stairs ?  ?  ?  ?  ?  ? ? ?Wheelchair Mobility ?  ? ?Modified Rankin (Stroke Patients Only) ?Modified Rankin (Stroke Patients Only) ?Pre-Morbid Rankin Score: No symptoms ?Modified Rankin: Moderate disability ? ? ?  ?Balance Overall balance assessment: Needs assistance ?Sitting-balance support: Feet supported ?Sitting balance-Leahy Scale: Good ?Sitting balance - Comments: Able to reach out of BOS and return ?  ?Standing balance support: No upper extremity supported ?Standing balance-Leahy Scale: Fair ?Standing balance comment: Able to perform ambulation w/o note LOB, able to reach to the ground and return to standing, and self guide through hallways to avoid people/obstacles ?  ?  ?  ?  ?  ?  ?High level balance activites: Direction changes, Turns, Head turns, Backward walking ?High Level Balance Comments: Tolerates well, slight decrease in gait speed during balance challenges, no noted LOB. ?  ?  ?  ?  ? ?  ?Cognition Arousal/Alertness: Awake/alert ?Behavior During Therapy: [facility] for tasks assessed/performed ?Overall Cognitive Status: Within Functional Limits for tasks assessed ?  ?  ?  ?  ?  ?  ?  ?  ?  ?  ?  ?  ?  ?  ?  ?  ?  ?  ?  ? ?  ?Exercises   ? ?  ?  General Comments General comments (skin integrity, edema, etc.): VSS on RA ?  ?  ? ?Pertinent Vitals/Pain Pain Assessment ?Pain Assessment: No/denies pain  ? ? ?Home Living   ?  ?  ?  ?  ?  ?  ?  ?  ?  ?   ?  ?Prior Function    ?  ?  ?   ? ?PT Goals (current goals can now be found in the care plan section) Acute  Rehab PT Goals ?Patient Stated Goal: return home ?PT Goal Formulation: With patient ?Time For Goal Achievement: 10/07/21 ?Potential to Achieve Goals: Good ?Progress towards PT goals: Progressing toward goals ? ?  ?Frequency ? ? ? Min 4X/week ? ? ? ?  ?PT Plan Current plan remains appropriate  ? ? ?Co-evaluation   ?  ?  ?  ?  ? ?  ?AM-PAC PT "6 Clicks" Mobility   ?Outcome Measure ? Help needed turning from your back to your side while in a flat bed without using bedrails?: None ?Help needed moving from lying on your back to sitting on the side of a flat bed without using bedrails?: None ?Help needed moving to and from a bed to a chair (including a wheelchair)?: A Little ?Help needed standing up from a chair using your arms (e.g., wheelchair or bedside chair)?: A Little ?Help needed to walk in hospital room?: A Little ?Help needed climbing 3-5 steps with a railing? : A Little ?6 Click Score: 20 ? ?  ?End of Session Equipment Utilized During Treatment: Gait belt ?Activity Tolerance: Patient tolerated treatment well ?Patient left: in chair;with call bell/phone within reach;with family/visitor present ?Nurse Communication: Mobility status ?PT Visit Diagnosis: Unsteadiness on feet (R26.81) ?  ? ? ?Time: 4098-1191 ?PT Time Calculation (min) (ACUTE ONLY): 18 min ? ?Charges:  $Neuromuscular Re-education: 8-22 mins          ?          ? ?Lorie Apley, SPT ?Acute Rehab Services ? ? ? ?Lorie Apley ?09/25/2021, 2:51 PM ? ?

## 2021-09-25 NOTE — Discharge Instructions (Signed)
Care After Your Loop Recorder ? ?You have a Forensic psychologist ?  ?Monitor your cardiac device site for redness, swelling, and drainage. Call the device clinic at [phone] if you experience these symptoms or fever/chills. ? ?You may shower 3 days after your loop recorder implant and wash your incision with soap and water. Avoid lotions, ointments, or perfumes over your incision until it is well-healed. ? ?You may use a hot tub or a pool AFTER your wound check appointment if the incision is completely closed. ? ?Your device is MRI compatible.  ? ?Remote monitoring is used to monitor your cardiac device from home. This monitoring is scheduled every month days by our office. It allows [place] to keep an eye on the functioning of your device to ensure it is working properly.  ?

## 2021-09-25 NOTE — Plan of Care (Signed)
?  Problem: Education: ?Goal: Knowledge of disease or condition will improve ?Outcome: Progressing ?  ?Problem: Coping: ?Goal: Will verbalize positive feelings about self ?Outcome: Progressing ?Goal: Will identify appropriate support needs ?Outcome: Progressing ?  ?Problem: Nutrition: ?Goal: Dietary intake will improve ?Outcome: Progressing ?  ?Problem: Ischemic Stroke/TIA Tissue Perfusion: ?Goal: Complications of ischemic stroke/TIA will be minimized ?Outcome: Progressing ?  ?

## 2021-09-25 NOTE — Progress Notes (Signed)
Occupational Therapy Treatment ?Patient Details ?Name: Mary Ortiz ?MRN: [id number] ?DOB: 1940/08/29 ?Today's Date: 09/25/2021 ? ? ?History of present illness Pt is 81 yo female who presents with aphasia, R side weakness, found on floor by EMS. CT showed L CVA and pt underwent revascularization 3/17. PMH: HTN, carotid stenosis. ?  ?OT comments ? Quintana is making great functional progress towards her goals, she is performing BADLs at her mod I baseline. Reviewed shower chair options with family and recommended placement of grab bars in the bathroom. Pt completed mini-cog and medi-cog assessments with an overall score of 5/10 which suggests inadequate skills to safely complete medication management; pt's niece present and willing to assist pt with IADLs tasks at d/c. Recommendations remain appropriate.    ? ?Recommendations for follow up therapy are one component of a multi-disciplinary discharge planning process, led by the attending physician.  Recommendations may be updated based on patient status, additional functional criteria and insurance authorization. ?   ?Follow Up Recommendations ? Home health OT  ?  ?Assistance Recommended at Discharge Intermittent Supervision/Assistance  ?Patient can return home with the following ? Assistance with cooking/housework;Assist for transportation ?  ?Equipment Recommendations ? None recommended by OT  ?  ?   ?Precautions / Restrictions Precautions ?Precautions: Fall ?Precaution Comments: watch BP ?Restrictions ?Weight Bearing Restrictions: No  ? ? ?  ? ?Mobility Bed Mobility ?Overal bed mobility: Needs Assistance ?  ?  ?  ?  ?  ?  ?General bed mobility comments: in chair upon arrival ?  ? ?Transfers ?Overall transfer level: Needs assistance ?  ?Transfers: Sit to/from Stand ?Sit to Stand: Modified independent (Device/Increase time) ?  ?  ?  ?  ?  ?General transfer comment: no assist, no LOB, no AD ?  ?  ?Balance Overall balance assessment: Needs assistance ?Sitting-balance  support: Feet supported ?Sitting balance-Leahy Scale: Good ?Sitting balance - Comments: Able to reach out of BOS and return ?  ?Standing balance support: No upper extremity supported, During functional activity ?Standing balance-Leahy Scale: Fair ?  ?  ?  ?  ?   ? ?ADL either performed or assessed with clinical judgement  ? ?ADL Overall ADL's : At baseline ?  ?  ?  ?  ?  ?  ?  ?General ADL Comments: pt performing ADLs at her baseline, superivison provided for safety and education to family. overall mod I. educated on different types of shower chair options and grab bar placement in shower. Recommend family assist with IADLs: med mgmt, money mgmt, and driving ?  ? ?Extremity/Trunk Assessment Upper Extremity Assessment ?Upper Extremity Assessment: Overall WFL for tasks assessed ?  ?Lower Extremity Assessment ?Lower Extremity Assessment: Overall WFL for tasks assessed ?  ?  ?  ? ?Vision   ?Vision Assessment?: No apparent visual deficits ?  ?Perception Perception ?Perception: Within Functional Limits ?  ?Praxis Praxis ?Praxis: Intact ?  ? ?Cognition Arousal/Alertness: Awake/alert ?Behavior During Therapy: [facility] for tasks assessed/performed ?Overall Cognitive Status: Impaired/Different from baseline ?Area of Impairment: Memory, Problem solving ?  ?  ?  ?  ?  ?  ?  ?  ?  ?  ?Memory: Decreased short-term memory ?  ?  ?  ?Problem Solving: Slow processing, Difficulty sequencing, Requires verbal cues ?General Comments: mini cog & medi cog complete with impairments in delayed recall, problem solving, working memory and sequencing. overall score of 5/10. ?  ?  ?   ?   ?   ?General Comments VSS on  RA, family present and supportive  ? ? ?Pertinent Vitals/ Pain       Pain Assessment ?Pain Assessment: No/denies pain ? ?Home Living Family/patient expects to be discharged to:: Private residence ?Living Arrangements: Alone ?  ?  ?  ?  ? ?  ?   ? ?Frequency ? Min 2X/week  ? ? ? ? ?  ?Progress Toward Goals ? ?OT Goals(current goals can  now be found in the care plan section) ? Progress towards OT goals: Progressing toward goals ? ?Acute Rehab OT Goals ?Patient Stated Goal: home today ?OT Goal Formulation: With patient ?Time For Goal Achievement: 10/06/21 ?Potential to Achieve Goals: Good ?ADL Goals ?Pt Will Perform Grooming: with modified independence;standing ?Pt Will Perform Upper Body Dressing: with modified independence;sitting;standing ?Pt Will Perform Lower Body Dressing: with modified independence;sit to/from stand ?Pt Will Transfer to Toilet: with modified independence;ambulating;regular height toilet  ?Plan Discharge plan remains appropriate   ? ?   ?AM-PAC OT "6 Clicks" Daily Activity     ?Outcome Measure ? ? Help from another person eating meals?: None ?Help from another person taking care of personal grooming?: None ?Help from another person toileting, which includes using toliet, bedpan, or urinal?: None ?Help from another person bathing (including washing, rinsing, drying)?: None ?Help from another person to put on and taking off regular upper body clothing?: None ?Help from another person to put on and taking off regular lower body clothing?: None ?6 Click Score: 24 ? ?  ?End of Session   ? ?OT Visit Diagnosis: Unsteadiness on feet (R26.81) ?  ?Activity Tolerance Patient tolerated treatment well ?  ?Patient Left in chair;with call bell/phone within reach;with family/visitor present ?  ?Nurse Communication Mobility status ?  ? ?   ? ?Time: 6269-4854 ?OT Time Calculation (min): 22 min ? ?Charges: OT General Charges ?$OT Visit: 1 Visit ? ? ?Marwin Primmer A Amanuel Sinkfield ?09/25/2021, 4:42 PM ?

## 2021-09-26 ENCOUNTER — Encounter (HOSPITAL_COMMUNITY): Payer: Self-pay | Admitting: Cardiology

## 2021-09-26 NOTE — TOC Transition Note (Signed)
Transition of Care (TOC) - CM/SW Discharge Note ? ? ?Patient Details  ?Name: Mary Ortiz ?MRN: [id number] ?Date of Birth: [birth year] ? ?Transition of Care [facility]) CM/SW Contact:  ?Glennon Mac, RN ?Phone Number: ?09/26/2021, 10:07 AM ? ? ?Clinical Narrative:    ?Pt is 81 yo female who presents with aphasia, R side weakness, found on floor by EMS. CT showed L CVA and pt underwent revascularization 3/17.  ?PTA, pt independent and living alone; she states that her niece plans to stay with her at discharge to provide assistance.  PT/OT recommending HH follow up, and patient agreeable to services.  Referral to [facility] for follow up; start of care 24-48h post dc.  No DME needed, per pt/therapies.  ? ? ?Final next level of care: Home w Home Health Services ?Barriers to Discharge: Barriers Resolved ? ? ?Patient Goals and CMS Choice ?Patient states their goals for this hospitalization and ongoing recovery are:: to go home ?CMS Medicare.gov Compare Post Acute Care list provided to:: Patient ?Choice offered to / list presented to : Patient ? ?  ?           ?  ?  ?  ?  ? ?Discharge Plan and Services ?  ?Discharge Planning Services: CM Consult ?Post Acute Care Choice: Home Health          ?  ?  ?  ?  ?  ?HH Arranged: PT, OT ?HH Agency: [facility] ?Date HH Agency Contacted: 09/25/21 ?Time HH Agency Contacted: 1645 ?Representative spoke with at [facility] Agency: Luanna Cole ? ?Social Determinants of Health (SDOH) Interventions ?  ? ? ?Readmission Risk Interventions ?No flowsheet data found. ? ?Quintella Baton, RN, BSN  ?Trauma/Neuro ICU Case Manager ?[phone] ? ? ? ?

## 2021-09-28 ENCOUNTER — Other Ambulatory Visit: Payer: Self-pay | Admitting: *Deleted

## 2021-09-28 NOTE — Patient Outreach (Signed)
Received a red flag Emmi stroke notification for Mary Ortiz. ?I have assigned Kemper Durie, RN to call for follow up and determine if there are any Case Management needs.  ?  ?Mary Ortiz, CBCS, CMAA ?[facility] Care Management Assistant ?[facility] Care Management ?[phone]   ?

## 2021-09-28 NOTE — Patient Outreach (Signed)
[facility] Customer service manager [facility]) Care Management ? ?09/28/2021 ? ?Mary Ortiz ?1941-01-09 ?[id number] ? ? ?RED ON EMMI ALERT - Stroke ?Day # 1 ?Date: 3/22 ?Red Alert Reason: Scheduled follow up appointment?  NO ? ? ?Outreach attempt #1, successful.  Identity verified.  This care manager introduced self and stated purpose of call.  [facility] care management services explained.   ? ?Member report living alone but state she is independent in all ADL's.  She is able to prepare meals for herself and manage medications.  State she is compliant with all medication changes post discharge, denies any questions.  Confirms she has started therapy sessions with home health PT/OT.   ? ?Has follow up appointment with primary on 3/27 and cardiology on 3/30.  Denies the need for transportation assistance.  Admits that she does continue to smoke, will discuss cessation options during PCP visit.  She has not received call from neurology office to schedule follow up.  Call placed, no answer, voice message left for new patient liaison to call this care manager or member directly.  Denies any urgent concerns, encouraged to contact this care manager with questions.   ? ?Plan: ?RN CM will send education regarding stroke prevention and smoking cessation.  Will follow up within the next 2 weeks. ? ?Kemper Durie, RN, MSN, CCM ?[facility] Care Management  ?Community Care Manager ?[phone] ? ? ?

## 2021-09-28 NOTE — Patient Instructions (Signed)
Visit Information ? ?Thank you for taking time to visit with me today. Please don't hesitate to contact me if I can be of assistance to you before our next scheduled telephone appointment. ? ?Following are the goals we discussed today:  ?Monitor blood pressure daily ?Discuss smoking cessation with provider ?Call Neurology office to schedule follow up ? ?Our next appointment is by telephone in 2 weeks ? ?The patient verbalized understanding of instructions, educational materials, and care plan provided today and agreed to receive a mailed copy of patient instructions, educational materials, and care plan.  ? ?The patient has been provided with contact information for the care management team and has been advised to call with any health related questions or concerns.  ? ?Kemper Durie, RN, MSN, CCM ?[facility] Care Management  ?Community Care Manager ?[phone] ? ?

## 2021-09-29 ENCOUNTER — Telehealth: Payer: Self-pay

## 2021-09-29 NOTE — Telephone Encounter (Signed)
Episode reviewed with Dr. Ladona Ridgel ECG shows AF. Apt scheduled with AF clinic on 10/03/21 at 11:30 patient aware of apt, number given to AF clinic for directions.  ?

## 2021-09-29 NOTE — Telephone Encounter (Signed)
Alert: [employer], faxed over full episode.  this is enhanced EGM from [employer].  ? ? ? ? ? ? ? ? ? ?

## 2021-10-03 ENCOUNTER — Other Ambulatory Visit: Payer: Self-pay | Admitting: *Deleted

## 2021-10-03 ENCOUNTER — Ambulatory Visit (HOSPITAL_COMMUNITY)
Admission: RE | Admit: 2021-10-03 | Discharge: 2021-10-03 | Disposition: A | Discharge status: Home / Self Care | Payer: Medicare Other | Source: Ambulatory Visit | Attending: Nurse Practitioner | Admitting: Nurse Practitioner

## 2021-10-03 ENCOUNTER — Encounter (HOSPITAL_COMMUNITY): Payer: Self-pay | Admitting: Nurse Practitioner

## 2021-10-03 ENCOUNTER — Other Ambulatory Visit: Payer: Self-pay

## 2021-10-03 VITALS — BP 168/66 | HR 62 | Ht 62.0 in | Wt 144.8 lb

## 2021-10-03 DIAGNOSIS — Z8679 Personal history of other diseases of the circulatory system: Secondary | ICD-10-CM | POA: Insufficient documentation

## 2021-10-03 DIAGNOSIS — I252 Old myocardial infarction: Secondary | ICD-10-CM | POA: Diagnosis not present

## 2021-10-03 DIAGNOSIS — I272 Pulmonary hypertension, unspecified: Secondary | ICD-10-CM | POA: Insufficient documentation

## 2021-10-03 DIAGNOSIS — I10 Essential (primary) hypertension: Secondary | ICD-10-CM | POA: Insufficient documentation

## 2021-10-03 DIAGNOSIS — D6869 Other thrombophilia: Secondary | ICD-10-CM | POA: Diagnosis not present

## 2021-10-03 DIAGNOSIS — I6529 Occlusion and stenosis of unspecified carotid artery: Secondary | ICD-10-CM | POA: Diagnosis not present

## 2021-10-03 DIAGNOSIS — Z79899 Other long term (current) drug therapy: Secondary | ICD-10-CM | POA: Diagnosis not present

## 2021-10-03 DIAGNOSIS — F17210 Nicotine dependence, cigarettes, uncomplicated: Secondary | ICD-10-CM | POA: Diagnosis not present

## 2021-10-03 DIAGNOSIS — I4891 Unspecified atrial fibrillation: Secondary | ICD-10-CM | POA: Insufficient documentation

## 2021-10-03 DIAGNOSIS — Z7982 Long term (current) use of aspirin: Secondary | ICD-10-CM | POA: Diagnosis not present

## 2021-10-03 LAB — LIPID PANEL WITH LDL/HDL RATIO
Cholesterol, Total: 143 mg/dL (ref 100–199)
HDL: 44 mg/dL (ref >39)
LDL Chol Calc (NIH): 77 mg/dL (ref 0–99)
LDL/HDL Ratio: 1.8 ratio (ref 0.0–3.2)
Triglycerides: 126 mg/dL (ref 0–149)
VLDL Cholesterol Cal: 22 mg/dL (ref 5–40)

## 2021-10-03 LAB — CMP14+EGFR
ALT: 38 IU/L — ABNORMAL HIGH (ref 0–32)
AST: 23 IU/L (ref 0–40)
Albumin/Globulin Ratio: 1.6 (ref 1.2–2.2)
Albumin: 4.1 g/dL (ref 3.6–4.6)
Alkaline Phosphatase: 86 IU/L (ref 44–121)
BUN/Creatinine Ratio: 12 (ref 12–28)
BUN: 16 mg/dL (ref 8–27)
Bilirubin Total: 0.3 mg/dL (ref 0.0–1.2)
CO2: 23 mmol/L (ref 20–29)
Calcium: 9.2 mg/dL (ref 8.7–10.3)
Chloride: 107 mmol/L — ABNORMAL HIGH (ref 96–106)
Creatinine, Ser: 1.36 mg/dL — ABNORMAL HIGH (ref 0.57–1.00)
Globulin, Total: 2.5 g/dL (ref 1.5–4.5)
Glucose: 96 mg/dL (ref 70–99)
Potassium: 4.6 mmol/L (ref 3.5–5.2)
Sodium: 142 mmol/L (ref 134–144)
Total Protein: 6.6 g/dL (ref 6.0–8.5)
eGFR: 39 mL/min/{1.73_m2} — ABNORMAL LOW (ref >59)

## 2021-10-03 MED ORDER — APIXABAN 5 MG PO TABS: 5.0000 mg | ORAL_TABLET | Freq: Two times a day (BID) | ORAL | 3 refills | Status: DC

## 2021-10-03 NOTE — Patient Instructions (Signed)
Eliquis 5mg  twice a day --- do not start until you hear from our office  ?

## 2021-10-03 NOTE — Progress Notes (Signed)
? ?Primary Care Physician: Leeroy Cha, MD ?Referring Physician: [facility]/device clinic ?Neurology: Dr. Leonie Man  ? ? ?Mary Ortiz is a 81 y.o. 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 LINQ implanted as embolic pattern source was unclear, concerning for occult afib. It did show afib on [id number] by device clic and confirmed to be afib by Dr. Lovena Le. EKG today SR with a short PR at 62 bpm, and PAC's with aberrancy. She feels well today. No obvious neuro deficients today. She was discharged on ASA and plavix.  ? ? ?Today, she denies symptoms of palpitations, chest pain, shortness of breath, orthopnea, PND, lower extremity edema, dizziness, presyncope, syncope, or neurologic sequela. The patient is tolerating medications without difficulties and is otherwise without complaint today.  ? ?Past Medical History:  ?Diagnosis Date  ? Carotid artery stenosis   ? HTN (hypertension) 01/23/2019  ? Hyperlipidemia   ? ?Past Surgical History:  ?Procedure Laterality Date  ? BACK SURGERY    ? 1977  ? CATARACT EXTRACTION, BILATERAL    ? 2019, 2016  ? Hystertectomy  1983  ? IR CT HEAD LTD  09/22/2021  ? IR PERCUTANEOUS ART THROMBECTOMY/INFUSION INTRACRANIAL INC DIAG ANGIO  09/22/2021  ? LOOP RECORDER INSERTION N/A 09/25/2021  ? Procedure: LOOP RECORDER INSERTION;  Surgeon: Vickie Epley, MD;  Location: [facility];  Service: Cardiovascular;  Laterality: N/A;  ? RADIOLOGY WITH ANESTHESIA N/A 09/22/2021  ? Procedure: IR WITH ANESTHESIA;  Surgeon: Radiologist, Medication, MD;  Location: [facility];  Service: Radiology;  Laterality: N/A;  ? ? ?Current Outpatient Medications  ?Medication Sig Dispense Refill  ? acetaminophen (TYLENOL) 500 MG tablet Take 500 mg by mouth every 6 (six) hours as needed for mild pain.    ? apixaban (ELIQUIS) 5 MG TABS tablet Take 1  tablet (5 mg total) by mouth 2 (two) times daily. 60 tablet 3  ? aspirin EC 81 MG EC tablet Take 1 tablet (81 mg total) by mouth daily. Swallow whole. 30 tablet 11  ? Cholecalciferol (VITAMIN D3) 50 MCG (2000 UT) TABS Take 2,000 Units by mouth daily.    ? clopidogrel (PLAVIX) 75 MG tablet Take 1 tablet (75 mg total) by mouth daily for 21 days. 21 tablet 0  ? furosemide (LASIX) 20 MG tablet Take 20 mg by mouth daily.    ? meclizine (ANTIVERT) 12.5 MG tablet Take 12.5 mg by mouth as needed for dizziness.    ? olmesartan (BENICAR) 5 MG tablet Take 5 mg by mouth daily.    ? ?No current facility-administered medications for this encounter.  ? ? ?Allergies  ?Allergen Reactions  ? Meperidine Hcl Nausea Only  ? Penicillins Other (See Comments)  ?  Pt states ineffective  ? Bacitracin-Polymyxin B Rash  ? Neosporin Plus Max St Rash  ? Tetanus-Diphtheria Toxoids Td Rash  ? ? ?Social History  ? ?Socioeconomic History  ? Marital status: Widowed  ?  Spouse name: Not on file  ? Number of children: 0  ? Years of education: Not on file  ? Highest education level: Not on file  ?Occupational History  ? Not on file  ?Tobacco Use  ? Smoking status: Every Day  ?  Packs/day: 0.25  ?  Types: Cigarettes  ? Smokeless tobacco: Never  ? Tobacco comments:  ?  smokes 3 packs a  week  ?Vaping Use  ? Vaping Use: Never used  ?Substance and Sexual Activity  ? Alcohol use: Never  ? Drug use: Never  ? Sexual activity: Not on file  ?Other Topics Concern  ? Not on file  ?Social History Narrative  ? Not on file  ? ?Social Determinants of Health  ? ?Financial Resource Strain: Not on file  ?Food Insecurity: Not on file  ?Transportation Needs: Not on file  ?Physical Activity: Not on file  ?Stress: Not on file  ?Social Connections: Not on file  ?Intimate Partner Violence: Not on file  ? ? ?Family History  ?Problem Relation Age of Onset  ? Heart attack Mother   ? Heart attack Father   ? Heart attack Brother   ? Suicidality Sister   ? Liver cancer Brother   ?  Kidney disease Brother   ? ? ?ROS- All systems are reviewed and negative except as per the HPI above ? ?Physical Exam: ?Vitals:  ? 10/03/21 1126  ?BP: (!) 168/66  ?Pulse: 62  ?Weight: 65.7 kg  ?Height: 5\' 2"  (1.575 m)  ? ?Wt Readings from Last 3 Encounters:  ?10/03/21 65.7 kg  ?09/22/21 69 kg  ?12/02/20 66.7 kg  ? ? ?Labs: ?Lab Results  ?Component Value Date  ? NA 142 10/02/2021  ? K 4.6 10/02/2021  ? CL 107 (H) 10/02/2021  ? CO2 23 10/02/2021  ? GLUCOSE 96 10/02/2021  ? BUN 16 10/02/2021  ? CREATININE 1.36 (H) 10/02/2021  ? CALCIUM 9.2 10/02/2021  ? ?Lab Results  ?Component Value Date  ? INR 1.0 09/22/2021  ? ?Lab Results  ?Component Value Date  ? CHOL 143 10/02/2021  ? HDL 44 10/02/2021  ? [place] 77 10/02/2021  ? TRIG 126 10/02/2021  ? ? ? ?GEN- The patient is well appearing, alert and oriented x 3 today.   ?Head- normocephalic, atraumatic ?Eyes-  Sclera clear, conjunctiva pink ?Ears- hearing intact ?Oropharynx- clear ?Neck- supple, no JVP ?Lymph- no cervical lymphadenopathy ?Lungs- Clear to ausculation bilaterally, normal work of breathing ?Heart- Regular rate and rhythm, no murmurs, rubs or gallops, PMI not laterally displaced ?GI- soft, NT, ND, + BS ?Extremities- no clubbing, cyanosis, or edema ?MS- no significant deformity or atrophy ?Skin- no rash or lesion ?Psych- euthymic mood, full affect ?Neuro- strength and sensation are intact ? ?EKG-sinus rhythm with short PR with PAC's with aberrancy.  ? ? ? ?Assessment and Plan:  ?1. Afib ?New onset afib in the setting of recent stroke  ?General education re afib and triggers ?She was d/c on baby asa and  clopidogrel 75 mg qd  ?I  reached out to Dr. Leonie Man to see if I can stop either  one or both  of these to start eliquis 5 mg bid  ?He said that asa and plavix can be stopped to start eliquis  ?I will give 30 day free card and she will start   ?I advised against using NSAIDS while on anticoagulation  ?Tylenol ok  ? ?2. HTN ?Elevated ?PCP started her back on  olmesartan yesterday ? ?I will see back in 3-4 weeks with a bmet/cbc ? ?Geroge Baseman Kayleen Memos, ANP-C ?Afib Clinic ?[facility] ?[address] ?[place], [place] [address] ?[phone]  ?

## 2021-10-03 NOTE — Addendum Note (Signed)
Encounter addended by: Shona Simpson, RN on: 10/03/2021 4:24 PM ? Actions taken: Order list changed

## 2021-10-03 NOTE — Patient Outreach (Signed)
[facility] Customer service manager [facility]) Care Management ? ?10/03/2021 ? ?Mary Ortiz ?[birth year] ?[id number] ? ? ?RED ON EMMI ALERT - Stroke ?Day # 6 ?Date: 3/27 ?Red Alert Reason: Smoked or been around smoke?  YES ? ? ?Outreach attempt #1, unsuccessful, HIPAA compliant voice message left.   ? ? ?Plan: ?RN CM will follow up within the next 3-4 business days. ? ?Kemper Durie, RN, MSN, CCM ?[facility] Care Management  ?Community Care Manager ?[phone] ? ? ?

## 2021-10-05 ENCOUNTER — Ambulatory Visit: Payer: Medicare Other

## 2021-10-06 ENCOUNTER — Telehealth: Payer: Self-pay

## 2021-10-06 ENCOUNTER — Other Ambulatory Visit: Payer: Self-pay | Admitting: *Deleted

## 2021-10-06 LAB — LDL CHOLESTEROL, DIRECT: LDL Direct: 79 mg/dL (ref 0–99)

## 2021-10-06 NOTE — Patient Outreach (Addendum)
[facility]) Care Management ? ?10/06/2021 ? ?Sharin Mons ?01-Oct-1940 ?[id number] ? ? ?RED ON EMMI ALERT - Stroke ?Day # 6 ?Date: 3/27 ?Red Alert Reason: Smoked or been around smoke?  YES ? ? ?Outreach attempt #2, successful.   ? ?Admits that she continues to smoke but has decreased to 1/2 pack per day.  Smoking cessation education mailed out to home, advised to check mailbox for package.  She will have follow up for wound check (loop recorder) on 4/6.  Report blood pressure today was elevated after taking medications.  Was 198/79, repeated during outreach call and remained elevated at 181/95.  Denies any headache, dizziness, or numbness.  Call placed to PCP office to report blood pressure despite taking medications (Benicar 5 mg and Lasix 20 mg).  Nurse will send message to provider and call either this care manager or member directly.   ? ? ?Plan: ?RN CM will await call back from PCP office for further instructions regarding hypertension.  Will follow up with member pending conversation with provider. ? ? ? ?Update @ 1630: ? ?Call placed to member.  State she received call from PCP office, they are increasing her Benicar to 20 mg daily.  She will continue to monitor blood pressure daily and follow up with PCP in the office on 4/10.  Denies any urgent concerns, encouraged to contact this care manager with questions.  Agrees to follow up within the next 2 weeks. ? ?Valente David, RN, MSN, CCM ?[facility] Care Management  ?Community Care Manager ?[phone] ? ?

## 2021-10-06 NOTE — Telephone Encounter (Signed)
Outreach made to Pt regarding pauses noted on loop monitor. ? ?Per Pt she was asymptomatic at these times. ? ?Advised to call office if she has any dizziness or presyncope. ?Pt indicates understanding. ?Will route to Dr. Quentin Ore as Juluis Rainier ? ?[facility] ILR alert for AF and pause ?Known PAF, pt. was seen 3/28 and Eliquis was prescribed ?Burden 7%, longest duration 5hrs 22min, mean HR's 98-161.  Burden increased to 23hrs per protocol. ?2 pause events:  3/30@ 17:23, duration 3.3sec, 3/30 @ 20:21, 3.6sec ?Route to triage ? ? ? ? ?

## 2021-10-10 ENCOUNTER — Other Ambulatory Visit: Payer: Self-pay | Admitting: *Deleted

## 2021-10-10 NOTE — Patient Outreach (Signed)
[facility]) Care Management ? ?10/10/2021 ? ?Mary Ortiz ?02-19-1941 ?[id number] ? ? ?RED ON EMMI ALERT - Stroke ?Day # 13 ?Date: 4/3 ?Red Alert Reason: Been to follow up appointment?  NO ? ? ?Outreach attempt #1, successful.   ? ?Member has not been to follow up appointments because they are scheduled in the future.  PCP scheduled for 4/10 and Neurology scheduled for 5/17.  Also has office visit with the A-fib clinic on 4/19.   ? ?Report her blood pressure has decreased with the the adjustment of her Benicar, today's reading was 158/74.  She will continue to monitor and take trends to PCP on Monday.  Denies any urgent concerns, encouraged to contact this care manager with questions.   ? ? ?Plan: ?RN CM will follow up within the next week. ? ?Valente David, RN, MSN, CCM ?[facility] Care Management  ?Community Care Manager ?[phone] ? ?

## 2021-10-12 ENCOUNTER — Ambulatory Visit (INDEPENDENT_AMBULATORY_CARE_PROVIDER_SITE_OTHER): Payer: Medicare Other

## 2021-10-12 ENCOUNTER — Ambulatory Visit: Payer: Medicare Other | Admitting: *Deleted

## 2021-10-12 DIAGNOSIS — I63512 Cerebral infarction due to unspecified occlusion or stenosis of left middle cerebral artery: Secondary | ICD-10-CM

## 2021-10-12 NOTE — Patient Instructions (Signed)
? ?  After Your Pacemaker ? ? ?Monitor your pacemaker site for redness, swelling, and drainage. Call the device clinic at [phone] if you experience these symptoms or fever/chills. ? ?

## 2021-10-12 NOTE — Progress Notes (Signed)
ILR wound check in clinic. Steri strips removed. Wound well healed. Home monitor transmitting nightly. Questions answered. ?

## 2021-10-20 ENCOUNTER — Other Ambulatory Visit: Payer: Self-pay | Admitting: *Deleted

## 2021-10-20 NOTE — Patient Outreach (Signed)
[facility] Customer service manager [facility]) Care Management ? ?10/20/2021 ? ?Mary Ortiz ?1941-03-23 ?[id number] ? ? ?Outgoing call placed to member, no answer, HIPAA compliant voice message left.  Will follow up within the next 3-4 business days. ? ?Kemper Durie, RN, MSN, CCM ?[facility] Care Management  ?Community Care Manager ?[phone] ? ?

## 2021-10-24 ENCOUNTER — Other Ambulatory Visit: Payer: Self-pay | Admitting: *Deleted

## 2021-10-24 NOTE — Patient Outreach (Signed)
[facility]) Care Management ? ?10/24/2021 ? ?Mary Ortiz ?06/29/1941 ?[id number] ? ? ?Outreach attempt #2 to member for stroke recovery follow up, successful.  State she is "ok."  Denies any signs/symptoms of recurrent stroke.  Report blood pressure has remained stable with the increase in her antihypertensive, will follow up with PCP in 3 months.  She has appointment tomorrow with A-fib clinic, 5/17 with neurology, and 5/30 with cardiology.  Confirms she has transportation for appointments. Denies any urgent concerns, encouraged to contact this care manager with questions.  Will close case at this time as no further needs identified. ? ?Valente David, RN, MSN, CCM ?[facility] Care Management  ?Community Care Manager ?[phone] ? ?

## 2021-10-25 ENCOUNTER — Ambulatory Visit (HOSPITAL_COMMUNITY): Payer: Medicare Other | Admitting: Nurse Practitioner

## 2021-10-30 ENCOUNTER — Ambulatory Visit (INDEPENDENT_AMBULATORY_CARE_PROVIDER_SITE_OTHER): Payer: Medicare Other

## 2021-10-30 DIAGNOSIS — I63512 Cerebral infarction due to unspecified occlusion or stenosis of left middle cerebral artery: Secondary | ICD-10-CM | POA: Diagnosis not present

## 2021-10-30 LAB — CUP PACEART REMOTE DEVICE CHECK
Date Time Interrogation Session: 20230424074649
Implantable Pulse Generator Implant Date: 20230320
Pulse Gen Serial Number: 175562

## 2021-11-14 NOTE — Progress Notes (Signed)
Carelink Summary Report / Loop Recorder 

## 2021-11-18 ENCOUNTER — Other Ambulatory Visit: Payer: Self-pay | Admitting: Cardiology

## 2021-11-22 ENCOUNTER — Inpatient Hospital Stay: Payer: Medicare Other | Admitting: Neurology

## 2021-11-30 ENCOUNTER — Ambulatory Visit (INDEPENDENT_AMBULATORY_CARE_PROVIDER_SITE_OTHER): Payer: Medicare Other

## 2021-11-30 DIAGNOSIS — I63512 Cerebral infarction due to unspecified occlusion or stenosis of left middle cerebral artery: Secondary | ICD-10-CM

## 2021-12-05 ENCOUNTER — Ambulatory Visit: Payer: Medicare Other | Admitting: Cardiology

## 2021-12-05 ENCOUNTER — Encounter: Payer: Self-pay | Admitting: Cardiology

## 2021-12-05 VITALS — BP 125/78 | HR 115 | Temp 98.3°F | Resp 16 | Ht 62.0 in | Wt 145.0 lb

## 2021-12-05 DIAGNOSIS — I10 Essential (primary) hypertension: Secondary | ICD-10-CM

## 2021-12-05 DIAGNOSIS — IMO0001 Reserved for inherently not codable concepts without codable children: Secondary | ICD-10-CM

## 2021-12-05 DIAGNOSIS — F17200 Nicotine dependence, unspecified, uncomplicated: Secondary | ICD-10-CM

## 2021-12-05 DIAGNOSIS — I63512 Cerebral infarction due to unspecified occlusion or stenosis of left middle cerebral artery: Secondary | ICD-10-CM

## 2021-12-05 DIAGNOSIS — I480 Paroxysmal atrial fibrillation: Secondary | ICD-10-CM

## 2021-12-05 DIAGNOSIS — I6523 Occlusion and stenosis of bilateral carotid arteries: Secondary | ICD-10-CM

## 2021-12-05 DIAGNOSIS — I351 Nonrheumatic aortic (valve) insufficiency: Secondary | ICD-10-CM

## 2021-12-05 DIAGNOSIS — I340 Nonrheumatic mitral (valve) insufficiency: Secondary | ICD-10-CM

## 2021-12-05 DIAGNOSIS — Z7901 Long term (current) use of anticoagulants: Secondary | ICD-10-CM

## 2021-12-05 LAB — CUP PACEART REMOTE DEVICE CHECK
Date Time Interrogation Session: 20230530081614
Implantable Pulse Generator Implant Date: 20230320
Pulse Gen Serial Number: 175562

## 2021-12-05 MED ORDER — METOPROLOL SUCCINATE ER 50 MG PO TB24: 50.0000 mg | ORAL_TABLET | Freq: Every day | ORAL | 0 refills | Status: DC

## 2021-12-05 MED ORDER — ATORVASTATIN CALCIUM 20 MG PO TABS: 20.0000 mg | ORAL_TABLET | Freq: Every day | ORAL | 0 refills | Status: DC

## 2021-12-05 NOTE — Progress Notes (Signed)
Sharin Ortiz Date of Birth: 05/28/1941 MRN: [id number] Primary Care Provider:Varadarajan, Ronie Spies, MD Former Cardiology Providers: Dr. Vear Clock Primary Cardiologist: Rex Kras, DO, [facility] (established care 01/27/2020)  Date: 12/05/21 Last Office Visit: 12/02/2020  Chief Complaint  Patient presents with   Follow-up    Status post hospitalization, stroke and new diagnosis of A-fib    HPI  Mary Ortiz is a 81 y.o.  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  Patient is being followed by the practice for management of valvular heart disease.  In March 2023 she was admitted with focal neurological deficits.  Noted to have left MCA infarct due to left M1 occlusion status post IR with TICI 3 likely embolic pattern.  He subsequently underwent loop recorder implantation prior to discharge and posthospitalization was notified of atrial fibrillation via her loop recorder.  She was seen in atrial fibrillation clinic and started on anticoagulation for thromboembolic prophylaxis and dual antiplatelet therapies were discontinued after discussing with neurology.  Unfortunately, she continues to smoke at least half a pack per day and has no intentions on quitting.   Family history of premature coronary artery disease: younger sister had her MI at age of 42.   FUNCTIONAL STATUS: No structured exercise program or daily routine.    ALLERGIES: Allergies  Allergen Reactions   Meperidine Hcl Nausea Only   Penicillins Other (See Comments)    Pt states ineffective   Bacitracin-Polymyxin B Rash   Neo-Bacit-Poly-Lidocaine Rash   Tetanus-Diphtheria Toxoids Td Rash    MEDICATION LIST PRIOR TO VISIT: Current Outpatient Medications on File Prior to Visit  Medication Sig Dispense  Refill   acetaminophen (TYLENOL) 500 MG tablet Take 500 mg by mouth every 6 (six) hours as needed for mild pain.     amLODipine (NORVASC) 10 MG tablet Take 10 mg by mouth daily.     apixaban (ELIQUIS) 5 MG TABS tablet Take 1 tablet (5 mg total) by mouth 2 (two) times daily. 60 tablet 3   Cholecalciferol (VITAMIN D3) 50 MCG (2000 UT) TABS Take 2,000 Units by mouth daily.     furosemide (LASIX) 20 MG tablet Take 1 tablet by mouth once daily 90 tablet 0   meclizine (ANTIVERT) 12.5 MG tablet Take 12.5 mg by mouth as needed for dizziness.     olmesartan (BENICAR) 20 MG tablet Take 20 mg by mouth daily.     No current facility-administered medications on file prior to visit.    PAST MEDICAL HISTORY: Past Medical History:  Diagnosis Date   Carotid artery stenosis    HTN (hypertension) 01/23/2019   Hyperlipidemia     PAST SURGICAL HISTORY: Past Surgical History:  Procedure Laterality Date   BACK SURGERY     1977   CATARACT EXTRACTION, BILATERAL     2019, 2016   Hystertectomy  1983   IR CT HEAD LTD  09/22/2021   IR PERCUTANEOUS ART THROMBECTOMY/INFUSION INTRACRANIAL INC DIAG ANGIO  09/22/2021   LOOP RECORDER INSERTION N/A 09/25/2021   Procedure: LOOP RECORDER INSERTION;  Surgeon: Vickie Epley, MD;  Location: [facility];  Service: Cardiovascular;  Laterality: N/A;   RADIOLOGY WITH ANESTHESIA N/A 09/22/2021   Procedure: IR WITH ANESTHESIA;  Surgeon: Radiologist, Medication, MD;  Location: [facility];  Service: Radiology;  Laterality: N/A;    FAMILY HISTORY: The patient's  family history includes Heart attack in her brother, father, and mother; Kidney disease in her brother; Liver cancer in her brother; Suicidality in her sister.   SOCIAL HISTORY:  The patient  reports that she has been smoking cigarettes. She has been smoking an average of .25 packs per day. She has never used smokeless tobacco. She reports that she does not drink alcohol and does not use drugs.  Review of Systems   Constitutional: Negative for chills and fever.  HENT:  Negative for hoarse voice and nosebleeds.   Eyes:  Negative for discharge, double vision and pain.  Cardiovascular:  Negative for chest pain, claudication, dyspnea on exertion, leg swelling, near-syncope, orthopnea, palpitations, paroxysmal nocturnal dyspnea and syncope.  Respiratory:  Negative for hemoptysis and shortness of breath.   Musculoskeletal:  Negative for muscle cramps and myalgias.  Gastrointestinal:  Negative for abdominal pain, constipation, diarrhea, hematemesis, hematochezia, melena, nausea and vomiting.  Neurological:  Negative for dizziness and light-headedness.   PHYSICAL EXAM:    12/05/2021    1:53 PM 10/03/2021   11:26 AM 09/25/2021    4:00 PM  Vitals with BMI  Height 5' 2"  5' 2"    Weight 145 lbs 144 lbs 13 oz   BMI 08.81 10.31   Systolic 594 585 929  Diastolic 78 66 53  Pulse 244 62 52    CONSTITUTIONAL: Well-developed and well-nourished. No acute distress.  SKIN: Skin is warm and dry. No rash noted. No cyanosis. No pallor. No jaundice HEAD: Normocephalic and atraumatic.  EYES: No scleral icterus MOUTH/THROAT: Moist oral membranes.  NECK: No JVD present. No thyromegaly noted.  Left carotid bruit. CHEST Normal respiratory effort. No intercostal retractions  LUNGS: Clear to auscultation bilaterally.  No stridor. No wheezes. No rales.  CARDIOVASCULAR: Tachycardia, irregularly irregular, variable Q2-M6, holosystolic murmur heard at the apex, 3 on 6 diastolic murmur heard at the left sternal border, no gallops or rubs. ABDOMINAL: Soft, nontender, nondistended, positive bowel sounds in all 4 quadrants, no apparent ascites.  EXTREMITIES: No peripheral edema  HEMATOLOGIC: No significant bruising NEUROLOGIC: Oriented to person, place, and time. Nonfocal. Normal muscle tone.  PSYCHIATRIC: Normal mood and affect. Normal behavior. Cooperative  CARDIAC DATABASE: EKG: 12/05/2021: Atrial flutter, 115 bpm, right  bundle branch block  Echocardiogram: 02/09/2020:  Left ventricle cavity is normal in size. Moderate concentric hypertrophy of the left ventricle. Normal global wall motion. Normal LV systolic function with visual EF 50-55%. Doppler evidence of grade II (pseudonormal) diastolic dysfunction, elevated LAP.  Left atrial cavity is mildly dilated.  Trileaflet aortic valve.  Moderate (Grade II) aortic regurgitation.  Mild to moderate mitral regurgitation.  Mild tricuspid regurgitation. Estimated pulmonary artery systolic pressure 34 mmHg.  No significant change compared tp previous study in 02/2019.   09/22/2021:  1. Left ventricular ejection fraction, by estimation, is 60 to 65%. The left ventricle has normal function. The left ventricle has no regional wall motion abnormalities. Left ventricular diastolic parameters are consistent with Grade II diastolic dysfunction (pseudonormalization).   2. Right ventricular systolic function is mildly reduced. The right ventricular size is normal. There is mildly elevated pulmonary artery systolic pressure. The estimated right ventricular systolic pressure is  38.1 mmHg.   3. Left atrial size was mildly dilated.   4. Right atrial size was mildly dilated.   5. The mitral valve is normal in structure. Mild to moderate mitral valve regurgitation. No evidence of mitral stenosis.   6. Tricuspid valve regurgitation is moderate.   7. The aortic valve  is tricuspid. Aortic valve regurgitation is mild. No aortic stenosis is present.   8. The inferior vena cava is normal in size with <50% respiratory variability, suggesting right atrial pressure of 8 mmHg.   Stress Testing:  None  Heart Catheterization: None  Carotid duplex: 02/09/2020:  Stenosis in the right internal carotid artery (50-69%). Stenosis in the right external carotid artery (<50%).  Stenosis in the left internal carotid artery (50-69%). Stenosis in the left external carotid artery (<50%).  Antegrade  right vertebral artery flow. Antegrade left vertebral artery flow.  Follow up in six months is appropriate if clinically indicated.   LABORATORY DATA: Lipid Panel     Component Value Date/Time   CHOL 143 10/02/2021 1432   TRIG 126 10/02/2021 1432   HDL 44 10/02/2021 1432   CHOLHDL 3.0 09/23/2021 0726   VLDL 25 09/23/2021 0726   LDLCALC 77 10/02/2021 1432   LDLDIRECT 79 10/02/2021 1433   LABVLDL 22 10/02/2021 1432   IMPRESSION:    ICD-10-CM   1. Cerebrovascular accident (CVA) due to occlusion of left middle cerebral artery (HCC)  I63.512 EKG 12-Lead    atorvastatin (LIPITOR) 20 MG tablet    Lipid Panel With LDL/HDL Ratio    LDL cholesterol, direct    CMP14+EGFR    2. Atrial fibrillation and flutter (HCC)  I48.91 metoprolol succinate (TOPROL XL) 50 MG 24 hr tablet   I48.92     3. Long term (current) use of anticoagulants  Z79.01     4. Bilateral carotid artery stenosis  I65.23 atorvastatin (LIPITOR) 20 MG tablet    PCV CAROTID DUPLEX (BILATERAL)    5. Essential hypertension  I10     6. Mild to Moderate mitral regurgitation  I34.0     7. Mild aortic regurgitation  I35.1     8. Smoking  F17.200        RECOMMENDATIONS: Mary Ortiz is a 81 y.o. 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  Cerebrovascular accident (CVA) due to occlusion of left middle cerebral artery [facility]) Index event March 2023.  CTA head and neck: Left M1 occlusion and bilateral carotid bulbs note atherosclerosis less than 50%.  MRI left insular cortex, BG and CR small infarcts MRA left M1 patent, bilateral proximal P1 moderate stenosis. 2D Echo EF 60 to 65% LE venous Doppler no DVT LDL 50 HgbA1c 5.3 Educated the importance of secondary prevention.  Paroxysmal atrial fibrillation  (HCC) Diagnosed status post CVA in March 2023 via loop recorder.  Rate control: N/A Rhythm control: N/A Thromboembolic prophylaxis: Eliquis CHA2DS2-VASc SCORE is 7 which correlates to 9.6 % risk of stroke per year (HTN, age, stroke, aortic atherosclerosis, gender).  EKG today illustrates atrial flutter.  Start metoprolol succinate 50 mg p.o. daily -for rate control strategy. Patient is asked to call the office if her ventricular rate remains greater than 100bpm at rest.   Long term (current) use of anticoagulants Indication: Paroxysmal atrial fibrillation. Reemphasized the risks, benefits, and alternatives to oral anticoagulation. Patient is aware to seek medical attention by going to the closest ER via EMS if she notices signs of bleeding or sustains an injury despite the mechanism to rule out internal bleeding.  Bilateral carotid artery atherosclerosis Prior carotid duplex from August 2021 concerning for bilateral carotid artery stenosis. During her recent stroke work-up with CTA of head and  neck notes bilateral carotid artery atherosclerosis due to noncalcified plaque.  Patient is LDL levels are well controlled but given the noncalcified plaque, carotid artery and aortic atherosclerosis, and recent stroke recommend statin therapy. Start Lipitor 20 mg p.o. nightly.  Fasting lipid profile in 6 weeks to reevaluate therapy. We will postpone carotid duplex to 1 year-March 2024 order 7 placed.  Essential hypertension Office blood pressures are well controlled. Medications reconciled. Reemphasized the importance of low-salt diet.  We will continue to monitor  Mild valvular heart disease Known history of both aortic and mitral regurgitation. Asymptomatic. Recently had an echo during her hospitalization in March 2023 results reviewed and noted above for further reference.  Smoking Tobacco cessation counseling: Currently smoking 0.5 packs/day   Patient is not willing to quit at this  time. 7 mins were spent counseling patient cessation techniques. We discussed various methods to help quit smoking, including deciding on a date to quit, joining a support group, pharmacological agents- nicotine gum/patch/lozenges, chantix.  I will reassess her progress at the next follow-up visit  Data Reviewed: I have independently reviewed notes from her recent hospitalization in March 2023 including history and physical, consultation notes, progress notes from providers, discharge summary, A-fib clinic notes and loop recorder transmissions..   I have independently reviewed results of chest x-ray, CT head without contrast, CTA head and neck, MRI without contrast, MRA head and neck as part of medical decision making. I have ordered the following tests:  Orders Placed This Encounter  Procedures   Lipid Panel With LDL/HDL Ratio    Standing Status:   Future    Standing Expiration Date:   12/06/2022   LDL cholesterol, direct    Standing Status:   Future    Standing Expiration Date:   12/06/2022   CMP14+EGFR    Standing Status:   Future    Standing Expiration Date:   12/06/2022   EKG 12-Lead   Meds ordered this encounter  Medications   metoprolol succinate (TOPROL XL) 50 MG 24 hr tablet    Sig: Take 1 tablet (50 mg total) by mouth daily. Take with or immediately following a meal.    Dispense:  90 tablet    Refill:  0   atorvastatin (LIPITOR) 20 MG tablet    Sig: Take 1 tablet (20 mg total) by mouth at bedtime.    Dispense:  90 tablet    Refill:  0   I have made medications changes at today's encounter as noted above.  Total time spent: 45 minutes  FINAL MEDICATION LIST END OF ENCOUNTER: Meds ordered this encounter  Medications   metoprolol succinate (TOPROL XL) 50 MG 24 hr tablet    Sig: Take 1 tablet (50 mg total) by mouth daily. Take with or immediately following a meal.    Dispense:  90 tablet    Refill:  0   atorvastatin (LIPITOR) 20 MG tablet    Sig: Take 1 tablet (20 mg  total) by mouth at bedtime.    Dispense:  90 tablet    Refill:  0    There are no discontinued medications.    Current Outpatient Medications:    acetaminophen (TYLENOL) 500 MG tablet, Take 500 mg by mouth every 6 (six) hours as needed for mild pain., Disp: , Rfl:    amLODipine (NORVASC) 10 MG tablet, Take 10 mg by mouth daily., Disp: , Rfl:    apixaban (ELIQUIS) 5 MG TABS tablet, Take 1 tablet (5 mg total) by mouth 2 (  two) times daily., Disp: 60 tablet, Rfl: 3   atorvastatin (LIPITOR) 20 MG tablet, Take 1 tablet (20 mg total) by mouth at bedtime., Disp: 90 tablet, Rfl: 0   Cholecalciferol (VITAMIN D3) 50 MCG (2000 UT) TABS, Take 2,000 Units by mouth daily., Disp: , Rfl:    furosemide (LASIX) 20 MG tablet, Take 1 tablet by mouth once daily, Disp: 90 tablet, Rfl: 0   meclizine (ANTIVERT) 12.5 MG tablet, Take 12.5 mg by mouth as needed for dizziness., Disp: , Rfl:    metoprolol succinate (TOPROL XL) 50 MG 24 hr tablet, Take 1 tablet (50 mg total) by mouth daily. Take with or immediately following a meal., Disp: 90 tablet, Rfl: 0   olmesartan (BENICAR) 20 MG tablet, Take 20 mg by mouth daily., Disp: , Rfl:   Orders Placed This Encounter  Procedures   Lipid Panel With LDL/HDL Ratio   LDL cholesterol, direct   CMP14+EGFR   EKG 12-Lead   PCV CAROTID DUPLEX (BILATERAL)   --Continue cardiac medications as reconciled in final medication list. --Return in about 7 weeks (around 01/23/2022) for Follow up atrial fibrillation/flutter, Lipid. Or sooner if needed. --Continue follow-up with your primary care physician regarding the management of your other chronic comorbid conditions.  Patient's questions and concerns were addressed to her satisfaction. She voices understanding of the instructions provided during this encounter.   This note was created using a voice recognition software as a result there may be grammatical errors inadvertently enclosed that do not reflect the nature of this encounter.  Every attempt is made to correct such errors.  Rex Kras, [place], [facility]  Pager: [phone] Office: [phone]

## 2021-12-12 NOTE — Progress Notes (Signed)
Carelink Summary Report / Loop Recorder 

## 2021-12-18 ENCOUNTER — Encounter: Payer: Self-pay | Admitting: Neurology

## 2021-12-18 ENCOUNTER — Inpatient Hospital Stay: Payer: Medicare Other | Admitting: Neurology

## 2021-12-29 ENCOUNTER — Other Ambulatory Visit: Payer: Self-pay

## 2022-01-01 ENCOUNTER — Ambulatory Visit: Payer: Medicare Other

## 2022-01-02 ENCOUNTER — Other Ambulatory Visit: Payer: Self-pay

## 2022-01-05 ENCOUNTER — Other Ambulatory Visit: Payer: Self-pay

## 2022-01-05 NOTE — Patient Outreach (Signed)
[facility]) Care Management  01/05/2022  CITLALIC NORLANDER 1941/05/16 [id number]   Telephone outreach to patient's sister to obtain mRS was successfully completed. MRS= 1  Vanice Sarah [facility] Care Management Assistant [phone]

## 2022-01-09 LAB — CUP PACEART REMOTE DEVICE CHECK
Date Time Interrogation Session: 20230703111205
Implantable Pulse Generator Implant Date: 20230320
Pulse Gen Serial Number: 175562

## 2022-01-15 ENCOUNTER — Other Ambulatory Visit: Payer: Self-pay | Admitting: Cardiology

## 2022-01-16 LAB — CMP14+EGFR
ALT: 21 IU/L (ref 0–32)
AST: 13 IU/L (ref 0–40)
Albumin/Globulin Ratio: 1.8 (ref 1.2–2.2)
Albumin: 4.4 g/dL (ref 3.7–4.7)
Alkaline Phosphatase: 92 IU/L (ref 44–121)
BUN/Creatinine Ratio: 17 (ref 12–28)
BUN: 24 mg/dL (ref 8–27)
Bilirubin Total: 0.4 mg/dL (ref 0.0–1.2)
CO2: 22 mmol/L (ref 20–29)
Calcium: 9.7 mg/dL (ref 8.7–10.3)
Chloride: 102 mmol/L (ref 96–106)
Creatinine, Ser: 1.45 mg/dL — ABNORMAL HIGH (ref 0.57–1.00)
Globulin, Total: 2.4 g/dL (ref 1.5–4.5)
Glucose: 101 mg/dL — ABNORMAL HIGH (ref 70–99)
Potassium: 4.9 mmol/L (ref 3.5–5.2)
Sodium: 141 mmol/L (ref 134–144)
Total Protein: 6.8 g/dL (ref 6.0–8.5)
eGFR: 36 mL/min/{1.73_m2} — ABNORMAL LOW (ref >59)

## 2022-01-16 LAB — LIPID PANEL WITH LDL/HDL RATIO
Cholesterol, Total: 110 mg/dL (ref 100–199)
HDL: 56 mg/dL (ref >39)
LDL Chol Calc (NIH): 37 mg/dL (ref 0–99)
LDL/HDL Ratio: 0.7 ratio (ref 0.0–3.2)
Triglycerides: 84 mg/dL (ref 0–149)
VLDL Cholesterol Cal: 17 mg/dL (ref 5–40)

## 2022-01-16 LAB — LDL CHOLESTEROL, DIRECT: LDL Direct: 40 mg/dL (ref 0–99)

## 2022-01-23 ENCOUNTER — Encounter: Payer: Self-pay | Admitting: Cardiology

## 2022-01-23 ENCOUNTER — Ambulatory Visit: Payer: Medicare Other | Admitting: Cardiology

## 2022-01-23 VITALS — BP 107/69 | HR 88 | Temp 97.7°F | Resp 16 | Ht 62.0 in | Wt 144.0 lb

## 2022-01-23 DIAGNOSIS — I480 Paroxysmal atrial fibrillation: Secondary | ICD-10-CM

## 2022-01-23 DIAGNOSIS — I4892 Unspecified atrial flutter: Secondary | ICD-10-CM

## 2022-01-23 DIAGNOSIS — I340 Nonrheumatic mitral (valve) insufficiency: Secondary | ICD-10-CM

## 2022-01-23 DIAGNOSIS — I6523 Occlusion and stenosis of bilateral carotid arteries: Secondary | ICD-10-CM

## 2022-01-23 DIAGNOSIS — F17200 Nicotine dependence, unspecified, uncomplicated: Secondary | ICD-10-CM

## 2022-01-23 DIAGNOSIS — I10 Essential (primary) hypertension: Secondary | ICD-10-CM

## 2022-01-23 DIAGNOSIS — Z7901 Long term (current) use of anticoagulants: Secondary | ICD-10-CM

## 2022-01-23 DIAGNOSIS — I4891 Unspecified atrial fibrillation: Secondary | ICD-10-CM

## 2022-01-23 DIAGNOSIS — I4819 Other persistent atrial fibrillation: Secondary | ICD-10-CM

## 2022-01-23 DIAGNOSIS — I63512 Cerebral infarction due to unspecified occlusion or stenosis of left middle cerebral artery: Secondary | ICD-10-CM

## 2022-01-23 NOTE — H&P (View-Only) (Signed)
 Mary Ortiz Date of Birth: 11/09/1940 MRN: [id number] Primary Care Provider:Varadarajan, Rupashree, MD Former Cardiology Providers: Dr. Chandra Vyas Primary Cardiologist: Yahayra Geis, DO, FACC (established care 01/27/2020)  Date: 01/23/22 Last Office Visit: 12/05/2021  Chief Complaint  Patient presents with   Follow-up    7 WEEKS   Atrial Fibrillation   Atrial Flutter   Hyperlipidemia    HPI  Mary Ortiz is a 80 y.o.  female whose past medical history and cardiovascular risk factors include: Left MCA infarct (March 2023), persistent atrial fibrillation/flutter, family history of premature CAD, active smoking, valvular heart disease, bilateral carotid artery stenosis, advanced age.  In March 2023 she was admitted with focal neurological deficits.  Noted to have left MCA infarct due to left M1 occlusion status post IR with TICI 3 likely embolic pattern.  He subsequently underwent loop recorder implantation prior to discharge and posthospitalization was notified of atrial fibrillation via her loop recorder.  She was seen in atrial fibrillation clinic and started on anticoagulation for thromboembolic prophylaxis and dual antiplatelet therapies were discontinued after discussing with neurology.  At the last office visit metoprolol 50 mg p.o. daily was initiated for better rate control strategy with the hopes of spontaneously converting to NSR.  Patient states that her ventricular rate has improved significantly and she feels better but the EKG still illustrates no organized rhythm consistent with atrial flutter.  Unfortunately, she continues to smoke at least half a pack per day and has no intentions on quitting.   Family history of premature coronary artery disease: younger sister had her MI at age of 60.   FUNCTIONAL STATUS: No structured exercise program or daily routine.    ALLERGIES: Allergies  Allergen Reactions   Meperidine Hcl Nausea Only   Penicillins Other (See  Comments)    Pt states ineffective   Bacitracin-Polymyxin B Rash   Neo-Bacit-Poly-Lidocaine Rash   Tetanus-Diphtheria Toxoids Td Rash    MEDICATION LIST PRIOR TO VISIT: Current Outpatient Medications on File Prior to Visit  Medication Sig Dispense Refill   acetaminophen (TYLENOL) 500 MG tablet Take 500 mg by mouth every 6 (six) hours as needed for mild pain.     amLODipine (NORVASC) 10 MG tablet Take 10 mg by mouth daily.     apixaban (ELIQUIS) 5 MG TABS tablet Take 1 tablet (5 mg total) by mouth 2 (two) times daily. 60 tablet 3   atorvastatin (LIPITOR) 20 MG tablet Take 1 tablet (20 mg total) by mouth at bedtime. 90 tablet 0   Cholecalciferol (VITAMIN D3) 50 MCG (2000 UT) TABS Take 2,000 Units by mouth daily.     furosemide (LASIX) 20 MG tablet Take 1 tablet by mouth once daily 90 tablet 0   meclizine (ANTIVERT) 12.5 MG tablet Take 12.5 mg by mouth as needed for dizziness.     metoprolol succinate (TOPROL XL) 50 MG 24 hr tablet Take 1 tablet (50 mg total) by mouth daily. Take with or immediately following a meal. 90 tablet 0   olmesartan (BENICAR) 20 MG tablet Take 20 mg by mouth daily.     No current facility-administered medications on file prior to visit.    PAST MEDICAL HISTORY: Past Medical History:  Diagnosis Date   Atrial fibrillation (HCC)    Carotid artery stenosis    HTN (hypertension) 01/23/2019   Hyperlipidemia    Stroke (HCC)     PAST SURGICAL HISTORY: Past Surgical History:  Procedure Laterality Date   BACK SURGERY       1977   CATARACT EXTRACTION, BILATERAL     2019, 2016   Hystertectomy  1983   IR CT HEAD LTD  09/22/2021   IR PERCUTANEOUS ART THROMBECTOMY/INFUSION INTRACRANIAL INC DIAG ANGIO  09/22/2021   LOOP RECORDER INSERTION N/A 09/25/2021   Procedure: LOOP RECORDER INSERTION;  Surgeon: Lanier Prude, MD;  Location: [facility];  Service: Cardiovascular;  Laterality: N/A;   RADIOLOGY WITH ANESTHESIA N/A 09/22/2021   Procedure: IR WITH  ANESTHESIA;  Surgeon: Radiologist, Medication, MD;  Location: [facility] OR;  Service: Radiology;  Laterality: N/A;    FAMILY HISTORY: The patient's family history includes Heart attack in her brother, father, and mother; Kidney disease in her brother; Liver cancer in her brother; Suicidality in her sister.   SOCIAL HISTORY:  The patient  reports that she has been smoking cigarettes. She has been smoking an average of .25 packs per day. She has never used smokeless tobacco. She reports that she does not drink alcohol and does not use drugs.  Review of Systems  Constitutional: Negative for chills and fever.  HENT:  Negative for hoarse voice and nosebleeds.   Eyes:  Negative for discharge, double vision and pain.  Cardiovascular:  Negative for chest pain, claudication, dyspnea on exertion, leg swelling, near-syncope, orthopnea, palpitations, paroxysmal nocturnal dyspnea and syncope.  Respiratory:  Negative for hemoptysis and shortness of breath.   Musculoskeletal:  Negative for muscle cramps and myalgias.  Gastrointestinal:  Negative for abdominal pain, constipation, diarrhea, hematemesis, hematochezia, melena, nausea and vomiting.  Neurological:  Negative for dizziness and light-headedness.    PHYSICAL EXAM:    01/23/2022    2:10 PM 12/05/2021    1:53 PM 10/03/2021   11:26 AM  Vitals with BMI  Height 5\' 2"  5\' 2"  5\' 2"   Weight 144 lbs 145 lbs 144 lbs 13 oz  BMI 26.33 26.51 26.48  Systolic 107 125  Diastolic 69 78 66  Pulse 88 115 62    CONSTITUTIONAL: Well-developed and well-nourished. No acute distress.  SKIN: Skin is warm and dry. No rash noted. No cyanosis. No pallor. No jaundice HEAD: Normocephalic and atraumatic.  EYES: No scleral icterus MOUTH/THROAT: Moist oral membranes.  NECK: No JVD present. No thyromegaly noted.  Left carotid bruit. CHEST Normal respiratory effort. No intercostal retractions  LUNGS: Clear to auscultation bilaterally.  No stridor. No wheezes. No rales.   CARDIOVASCULAR: irregularly irregular, variable S1-S2, holosystolic murmur heard at the apex, 3 on 6 diastolic murmur heard at the left sternal border, no gallops or rubs. ABDOMINAL: Soft, nontender, nondistended, positive bowel sounds in all 4 quadrants, no apparent ascites.  EXTREMITIES: No peripheral edema , warm to touch HEMATOLOGIC: No significant bruising NEUROLOGIC: Oriented to person, place, and time. Nonfocal. Normal muscle tone.  PSYCHIATRIC: Normal mood and affect. Normal behavior. Cooperative  CARDIAC DATABASE: EKG: 12/05/2021: Atrial flutter, 115 bpm, right bundle branch block 01/23/2022: Atrial flutter, 83 bpm, right bundle branch block.   Echocardiogram: 02/09/2020:  Left ventricle cavity is normal in size. Moderate concentric hypertrophy of the left ventricle. Normal global wall motion. Normal LV systolic function with visual EF 50-55%. Doppler evidence of grade II (pseudonormal) diastolic dysfunction, elevated LAP.  Left atrial cavity is mildly dilated.  Trileaflet aortic valve.  Moderate (Grade II) aortic regurgitation.  Mild to moderate mitral regurgitation.  Mild tricuspid regurgitation. Estimated pulmonary artery systolic pressure 34 mmHg.  No significant change compared tp previous study in 02/2019.   09/22/2021:  1. Left ventricular ejection fraction, by estimation,  is 60 to 65%. The left ventricle has normal function. The left ventricle has no regional wall motion abnormalities. Left ventricular diastolic parameters are consistent with Grade II diastolic dysfunction (pseudonormalization).   2. Right ventricular systolic function is mildly reduced. The right ventricular size is normal. There is mildly elevated pulmonary artery systolic pressure. The estimated right ventricular systolic pressure is  42.1 mmHg.   3. Left atrial size was mildly dilated.   4. Right atrial size was mildly dilated.   5. The mitral valve is normal in structure. Mild to moderate mitral valve  regurgitation. No evidence of mitral stenosis.   6. Tricuspid valve regurgitation is moderate.   7. The aortic valve is tricuspid. Aortic valve regurgitation is mild. No aortic stenosis is present.   8. The inferior vena cava is normal in size with <50% respiratory variability, suggesting right atrial pressure of 8 mmHg.   Stress Testing:  None  Heart Catheterization: None  Carotid duplex: 02/09/2020:  Stenosis in the right internal carotid artery (50-69%). Stenosis in the right external carotid artery (<50%).  Stenosis in the left internal carotid artery (50-69%). Stenosis in the left external carotid artery (<50%).  Antegrade right vertebral artery flow. Antegrade left vertebral artery flow.  Follow up in six months is appropriate if clinically indicated.   LABORATORY DATA: Lipid Panel     Component Value Date/Time   CHOL 110 01/15/2022 0914   TRIG 84 01/15/2022 0914   HDL 56 01/15/2022 0914   CHOLHDL 3.0 09/23/2021 0726   VLDL 25 09/23/2021 0726   LDLCALC 37 01/15/2022 0914   LDLDIRECT 40 01/15/2022 0914   LABVLDL 17 01/15/2022 0914   IMPRESSION:    ICD-10-CM   1. Atrial fibrillation and flutter (HCC)  I48.91 EKG 12-Lead   I48.92     2. Persistent atrial fibrillation (HCC)  I48.19     3. Long term (current) use of anticoagulants  Z79.01     4. Cerebrovascular accident (CVA) due to occlusion of left middle cerebral artery (HCC)  I63.512     5. Atherosclerosis of both carotid arteries  I65.23 PCV CAROTID DUPLEX (BILATERAL)    6. Essential hypertension  I10     7. Mild to Moderate mitral regurgitation  I34.0     8. Smoking  F17.200        RECOMMENDATIONS: Mary Ortiz is a 80 y.o. female whose past medical history and cardiovascular risk factors include: Left MCA infarct (March 2023), persistent atrial fibrillation/flutter, family history of premature CAD, active smoking, valvular heart disease, bilateral carotid artery stenosis, advanced age.  Persistent  atrial fibrillation/flutter (HCC) Diagnosed status post CVA in March 2023 via loop recorder. Rate control: Metoprolol. Rhythm control: N/A. Thromboembolic prophylaxis: Eliquis CHA2DS2-VASc SCORE is 7 which correlates to 9.6% risk of stroke per year (HTN, age, CVA, aortic atherosclerosis, gender). EKG illustrates a more organized rhythm consistent with atrial flutter with controlled ventricular rate. Patient has been on oral anticoagulation uninterrupted for at least 4 weeks. We discussed both direct-current cardioversion only versus  TEE guided cardioversion. After discussing the risks benefits, alternatives, and limitations patient would like to proceed with only direct-current cardioversion. Risks, benefits, and alternatives of direct current cardioversion reviewed with the and patient. Risk includes but not limited to: potential for post-cardioversion rhythms, life-threatening arrhythmias (ventricular tachycardia and fibrillation, profound bradycardia, cardiac arrest), myocardial damage, acute pulmonary edema, skin burns, transient hypotension. Benefits include restoration of sinus rhythm. Alternatives to treatment were discussed, questions were answered, patient voices understanding and   provides verbal feedback.  Patient is willing to proceed.   Long term (current) use of anticoagulants Indication: Persistent atrial fibrillation/flutter Does not endorse evidence of bleeding. Risks, benefits, and alternatives to anticoagulation rediscussed at today's office visit.  Patient verbalizes understanding.  Cerebrovascular accident (CVA) due to occlusion of left middle cerebral artery (HCC) Index event March 2023. CTA head and neck: Left M1 occlusion and bilateral carotid bulbs note atherosclerosis less than 50%.  MRI left insular cortex, BG and CR small infarcts MRA left M1 patent, bilateral proximal P1 moderate stenosis. 2D Echo EF 60 to 65% LE venous Doppler no DVT LDL 50 HgbA1c 5.3 Educated  the importance of secondary prevention.  Atherosclerosis of both carotid arteries In March 2023 had a CTA head and neck which noted bilateral carotid artery atherosclerosis due to noncalcified plaque. Despite having adequate LDL levels at the time of her stroke this shared decision was to initiate statin therapy at the last office visit given the atherosclerosis involving the carotid beds, aortic atherosclerosis. We will check fasting lipid profile and direct LDL and CMP. We will postpone the carotid duplex for March 2024, orders placed.  Essential hypertension Office blood pressures are well controlled. Continue current medical therapy. No changes warranted.  Smoking Continues to smoke at least half a pack per day. Despite high CHA2DS2-VASc score and recent stroke patient is adamant that it would be difficult for her to quit smoking.  Benefits of complete smoking cessation discussed with the patient. We will continue to monitor.  FINAL MEDICATION LIST END OF ENCOUNTER: No orders of the defined types were placed in this encounter.   There are no discontinued medications.    Current Outpatient Medications:    acetaminophen (TYLENOL) 500 MG tablet, Take 500 mg by mouth every 6 (six) hours as needed for mild pain., Disp: , Rfl:    amLODipine (NORVASC) 10 MG tablet, Take 10 mg by mouth daily., Disp: , Rfl:    apixaban (ELIQUIS) 5 MG TABS tablet, Take 1 tablet (5 mg total) by mouth 2 (two) times daily., Disp: 60 tablet, Rfl: 3   atorvastatin (LIPITOR) 20 MG tablet, Take 1 tablet (20 mg total) by mouth at bedtime., Disp: 90 tablet, Rfl: 0   Cholecalciferol (VITAMIN D3) 50 MCG (2000 UT) TABS, Take 2,000 Units by mouth daily., Disp: , Rfl:    furosemide (LASIX) 20 MG tablet, Take 1 tablet by mouth once daily, Disp: 90 tablet, Rfl: 0   meclizine (ANTIVERT) 12.5 MG tablet, Take 12.5 mg by mouth as needed for dizziness., Disp: , Rfl:    metoprolol succinate (TOPROL XL) 50 MG 24 hr tablet, Take  1 tablet (50 mg total) by mouth daily. Take with or immediately following a meal., Disp: 90 tablet, Rfl: 0   olmesartan (BENICAR) 20 MG tablet, Take 20 mg by mouth daily., Disp: , Rfl:   Orders Placed This Encounter  Procedures   EKG 12-Lead   --Continue cardiac medications as reconciled in final medication list. --Return in about 3 weeks (around 02/13/2022) for Follow-up after direct-current cardioversion.. Or sooner if needed. --Continue follow-up with your primary care physician regarding the management of your other chronic comorbid conditions.  Patient's questions and concerns were addressed to her satisfaction. She voices understanding of the instructions provided during this encounter.   This note was created using a voice recognition software as a result there may be grammatical errors inadvertently enclosed that do not reflect the nature of this encounter. Every attempt is made to correct such   errors.  Tessa Lerner, [place], [facility]  Pager: [phone] Office: [phone]

## 2022-01-23 NOTE — Progress Notes (Signed)
Sharin Mons Date of Birth: 02/22/1941 MRN: [id number] Primary Care Provider:Varadarajan, Ronie Spies, MD Former Cardiology Providers: Dr. Vear Clock Primary Cardiologist: Rex Kras, DO, [facility] (established care 01/27/2020)  Date: 01/23/22 Last Office Visit: 12/05/2021  Chief Complaint  Patient presents with   Follow-up    7 WEEKS   Atrial Fibrillation   Atrial Flutter   Hyperlipidemia    HPI  Mary Ortiz is a 81 y.o.  female whose past medical history and cardiovascular risk factors include: Left MCA infarct (March 2023), persistent atrial fibrillation/flutter, family history of premature CAD, active smoking, valvular heart disease, bilateral carotid artery stenosis, advanced age.  In March 2023 she was admitted with focal neurological deficits.  Noted to have left MCA infarct due to left M1 occlusion status post IR with TICI 3 likely embolic pattern.  He subsequently underwent loop recorder implantation prior to discharge and posthospitalization was notified of atrial fibrillation via her loop recorder.  She was seen in atrial fibrillation clinic and started on anticoagulation for thromboembolic prophylaxis and dual antiplatelet therapies were discontinued after discussing with neurology.  At the last office visit metoprolol 50 mg p.o. daily was initiated for better rate control strategy with the hopes of spontaneously converting to NSR.  Patient states that her ventricular rate has improved significantly and she feels better but the EKG still illustrates no organized rhythm consistent with atrial flutter.  Unfortunately, she continues to smoke at least half a pack per day and has no intentions on quitting.   Family history of premature coronary artery disease: younger sister had her MI at age of 50.   FUNCTIONAL STATUS: No structured exercise program or daily routine.    ALLERGIES: Allergies  Allergen Reactions   Meperidine Hcl Nausea Only   Penicillins Other (See  Comments)    Pt states ineffective   Bacitracin-Polymyxin B Rash   Neo-Bacit-Poly-Lidocaine Rash   Tetanus-Diphtheria Toxoids Td Rash    MEDICATION LIST PRIOR TO VISIT: Current Outpatient Medications on File Prior to Visit  Medication Sig Dispense Refill   acetaminophen (TYLENOL) 500 MG tablet Take 500 mg by mouth every 6 (six) hours as needed for mild pain.     amLODipine (NORVASC) 10 MG tablet Take 10 mg by mouth daily.     apixaban (ELIQUIS) 5 MG TABS tablet Take 1 tablet (5 mg total) by mouth 2 (two) times daily. 60 tablet 3   atorvastatin (LIPITOR) 20 MG tablet Take 1 tablet (20 mg total) by mouth at bedtime. 90 tablet 0   Cholecalciferol (VITAMIN D3) 50 MCG (2000 UT) TABS Take 2,000 Units by mouth daily.     furosemide (LASIX) 20 MG tablet Take 1 tablet by mouth once daily 90 tablet 0   meclizine (ANTIVERT) 12.5 MG tablet Take 12.5 mg by mouth as needed for dizziness.     metoprolol succinate (TOPROL XL) 50 MG 24 hr tablet Take 1 tablet (50 mg total) by mouth daily. Take with or immediately following a meal. 90 tablet 0   olmesartan (BENICAR) 20 MG tablet Take 20 mg by mouth daily.     No current facility-administered medications on file prior to visit.    PAST MEDICAL HISTORY: Past Medical History:  Diagnosis Date   Atrial fibrillation ([place])    Carotid artery stenosis    HTN (hypertension) 01/23/2019   Hyperlipidemia    Stroke [facility])     PAST SURGICAL HISTORY: Past Surgical History:  Procedure Laterality Date   BACK SURGERY  1977   CATARACT EXTRACTION, BILATERAL     2019, 2016   Hystertectomy  1983   IR CT HEAD LTD  09/22/2021   IR PERCUTANEOUS ART THROMBECTOMY/INFUSION INTRACRANIAL INC DIAG ANGIO  09/22/2021   LOOP RECORDER INSERTION N/A 09/25/2021   Procedure: LOOP RECORDER INSERTION;  Surgeon: Lanier Prude, MD;  Location: [facility] INVASIVE CV LAB;  Service: Cardiovascular;  Laterality: N/A;   RADIOLOGY WITH ANESTHESIA N/A 09/22/2021   Procedure: IR WITH  ANESTHESIA;  Surgeon: Radiologist, Medication, MD;  Location: [facility] OR;  Service: Radiology;  Laterality: N/A;    FAMILY HISTORY: The patient's family history includes Heart attack in her brother, father, and mother; Kidney disease in her brother; Liver cancer in her brother; Suicidality in her sister.   SOCIAL HISTORY:  The patient  reports that she has been smoking cigarettes. She has been smoking an average of .25 packs per day. She has never used smokeless tobacco. She reports that she does not drink alcohol and does not use drugs.  Review of Systems  Constitutional: Negative for chills and fever.  HENT:  Negative for hoarse voice and nosebleeds.   Eyes:  Negative for discharge, double vision and pain.  Cardiovascular:  Negative for chest pain, claudication, dyspnea on exertion, leg swelling, near-syncope, orthopnea, palpitations, paroxysmal nocturnal dyspnea and syncope.  Respiratory:  Negative for hemoptysis and shortness of breath.   Musculoskeletal:  Negative for muscle cramps and myalgias.  Gastrointestinal:  Negative for abdominal pain, constipation, diarrhea, hematemesis, hematochezia, melena, nausea and vomiting.  Neurological:  Negative for dizziness and light-headedness.    PHYSICAL EXAM:    01/23/2022    2:10 PM 12/05/2021    1:53 PM 10/03/2021   11:26 AM  Vitals with BMI  Height 5\' 2"  5\' 2"  5\' 2"   Weight 144 lbs 145 lbs 144 lbs 13 oz  BMI 26.33 26.51 26.48  Systolic 107 125  Diastolic 69 78 66  Pulse 88 115 62    CONSTITUTIONAL: Well-developed and well-nourished. No acute distress.  SKIN: Skin is warm and dry. No rash noted. No cyanosis. No pallor. No jaundice HEAD: Normocephalic and atraumatic.  EYES: No scleral icterus MOUTH/THROAT: Moist oral membranes.  NECK: No JVD present. No thyromegaly noted.  Left carotid bruit. CHEST Normal respiratory effort. No intercostal retractions  LUNGS: Clear to auscultation bilaterally.  No stridor. No wheezes. No rales.   CARDIOVASCULAR: irregularly irregular, variable S1-S2, holosystolic murmur heard at the apex, 3 on 6 diastolic murmur heard at the left sternal border, no gallops or rubs. ABDOMINAL: Soft, nontender, nondistended, positive bowel sounds in all 4 quadrants, no apparent ascites.  EXTREMITIES: No peripheral edema , warm to touch HEMATOLOGIC: No significant bruising NEUROLOGIC: Oriented to person, place, and time. Nonfocal. Normal muscle tone.  PSYCHIATRIC: Normal mood and affect. Normal behavior. Cooperative  CARDIAC DATABASE: EKG: 12/05/2021: Atrial flutter, 115 bpm, right bundle branch block 01/23/2022: Atrial flutter, 83 bpm, right bundle branch block.   Echocardiogram: 02/09/2020:  Left ventricle cavity is normal in size. Moderate concentric hypertrophy of the left ventricle. Normal global wall motion. Normal LV systolic function with visual EF 50-55%. Doppler evidence of grade II (pseudonormal) diastolic dysfunction, elevated LAP.  Left atrial cavity is mildly dilated.  Trileaflet aortic valve.  Moderate (Grade II) aortic regurgitation.  Mild to moderate mitral regurgitation.  Mild tricuspid regurgitation. Estimated pulmonary artery systolic pressure 34 mmHg.  No significant change compared tp previous study in 02/2019.   09/22/2021:  1. Left ventricular ejection fraction, by estimation,  is 60 to 65%. The left ventricle has normal function. The left ventricle has no regional wall motion abnormalities. Left ventricular diastolic parameters are consistent with Grade II diastolic dysfunction (pseudonormalization).   2. Right ventricular systolic function is mildly reduced. The right ventricular size is normal. There is mildly elevated pulmonary artery systolic pressure. The estimated right ventricular systolic pressure is  [id number] mmHg.   3. Left atrial size was mildly dilated.   4. Right atrial size was mildly dilated.   5. The mitral valve is normal in structure. Mild to moderate mitral valve  regurgitation. No evidence of mitral stenosis.   6. Tricuspid valve regurgitation is moderate.   7. The aortic valve is tricuspid. Aortic valve regurgitation is mild. No aortic stenosis is present.   8. The inferior vena cava is normal in size with <50% respiratory variability, suggesting right atrial pressure of 8 mmHg.   Stress Testing:  None  Heart Catheterization: None  Carotid duplex: 02/09/2020:  Stenosis in the right internal carotid artery (50-69%). Stenosis in the right external carotid artery (<50%).  Stenosis in the left internal carotid artery (50-69%). Stenosis in the left external carotid artery (<50%).  Antegrade right vertebral artery flow. Antegrade left vertebral artery flow.  Follow up in six months is appropriate if clinically indicated.   LABORATORY DATA: Lipid Panel     Component Value Date/Time   CHOL 110 01/15/2022 0914   TRIG 84 01/15/2022 0914   HDL 56 01/15/2022 0914   CHOLHDL 3.0 09/23/2021 0726   VLDL 25 09/23/2021 0726   LDLCALC 37 01/15/2022 0914   LDLDIRECT 40 01/15/2022 0914   LABVLDL 17 01/15/2022 0914   IMPRESSION:    ICD-10-CM   1. Atrial fibrillation and flutter (HCC)  I48.91 EKG 12-Lead   I48.92     2. Persistent atrial fibrillation (HCC)  I48.19     3. Long term (current) use of anticoagulants  Z79.01     4. Cerebrovascular accident (CVA) due to occlusion of left middle cerebral artery ([place])  I63.512     5. Atherosclerosis of both carotid arteries  I65.23 PCV CAROTID DUPLEX (BILATERAL)    6. Essential hypertension  I10     7. Mild to Moderate mitral regurgitation  I34.0     8. Smoking  F17.200        RECOMMENDATIONS: Mary Ortiz is a 81 y.o. female whose past medical history and cardiovascular risk factors include: Left MCA infarct (March 2023), persistent atrial fibrillation/flutter, family history of premature CAD, active smoking, valvular heart disease, bilateral carotid artery stenosis, advanced age.  Persistent  atrial fibrillation/flutter (HCC) Diagnosed status post CVA in March 2023 via loop recorder. Rate control: Metoprolol. Rhythm control: N/A. Thromboembolic prophylaxis: Eliquis CHA2DS2-VASc SCORE is 7 which correlates to 9.6% risk of stroke per year (HTN, age, CVA, aortic atherosclerosis, gender). EKG illustrates a more organized rhythm consistent with atrial flutter with controlled ventricular rate. Patient has been on oral anticoagulation uninterrupted for at least 4 weeks. We discussed both direct-current cardioversion only versus  TEE guided cardioversion. After discussing the risks benefits, alternatives, and limitations patient would like to proceed with only direct-current cardioversion. Risks, benefits, and alternatives of direct current cardioversion reviewed with the and patient. Risk includes but not limited to: potential for post-cardioversion rhythms, life-threatening arrhythmias (ventricular tachycardia and fibrillation, profound bradycardia, cardiac arrest), myocardial damage, acute pulmonary edema, skin burns, transient hypotension. Benefits include restoration of sinus rhythm. Alternatives to treatment were discussed, questions were answered, patient voices understanding and  provides verbal feedback.  Patient is willing to proceed.   Long term (current) use of anticoagulants Indication: Persistent atrial fibrillation/flutter Does not endorse evidence of bleeding. Risks, benefits, and alternatives to anticoagulation rediscussed at today's office visit.  Patient verbalizes understanding.  Cerebrovascular accident (CVA) due to occlusion of left middle cerebral artery [facility]) Index event March 2023. CTA head and neck: Left M1 occlusion and bilateral carotid bulbs note atherosclerosis less than 50%.  MRI left insular cortex, BG and CR small infarcts MRA left M1 patent, bilateral proximal P1 moderate stenosis. 2D Echo EF 60 to 65% LE venous Doppler no DVT LDL 50 HgbA1c 5.3 Educated  the importance of secondary prevention.  Atherosclerosis of both carotid arteries In March 2023 had a CTA head and neck which noted bilateral carotid artery atherosclerosis due to noncalcified plaque. Despite having adequate LDL levels at the time of her stroke this shared decision was to initiate statin therapy at the last office visit given the atherosclerosis involving the carotid beds, aortic atherosclerosis. We will check fasting lipid profile and direct LDL and CMP. We will postpone the carotid duplex for March 2024, orders placed.  Essential hypertension Office blood pressures are well controlled. Continue current medical therapy. No changes warranted.  Smoking Continues to smoke at least half a pack per day. Despite high CHA2DS2-VASc score and recent stroke patient is adamant that it would be difficult for her to quit smoking.  Benefits of complete smoking cessation discussed with the patient. We will continue to monitor.  FINAL MEDICATION LIST END OF ENCOUNTER: No orders of the defined types were placed in this encounter.   There are no discontinued medications.    Current Outpatient Medications:    acetaminophen (TYLENOL) 500 MG tablet, Take 500 mg by mouth every 6 (six) hours as needed for mild pain., Disp: , Rfl:    amLODipine (NORVASC) 10 MG tablet, Take 10 mg by mouth daily., Disp: , Rfl:    apixaban (ELIQUIS) 5 MG TABS tablet, Take 1 tablet (5 mg total) by mouth 2 (two) times daily., Disp: 60 tablet, Rfl: 3   atorvastatin (LIPITOR) 20 MG tablet, Take 1 tablet (20 mg total) by mouth at bedtime., Disp: 90 tablet, Rfl: 0   Cholecalciferol (VITAMIN D3) 50 MCG (2000 UT) TABS, Take 2,000 Units by mouth daily., Disp: , Rfl:    furosemide (LASIX) 20 MG tablet, Take 1 tablet by mouth once daily, Disp: 90 tablet, Rfl: 0   meclizine (ANTIVERT) 12.5 MG tablet, Take 12.5 mg by mouth as needed for dizziness., Disp: , Rfl:    metoprolol succinate (TOPROL XL) 50 MG 24 hr tablet, Take  1 tablet (50 mg total) by mouth daily. Take with or immediately following a meal., Disp: 90 tablet, Rfl: 0   olmesartan (BENICAR) 20 MG tablet, Take 20 mg by mouth daily., Disp: , Rfl:   Orders Placed This Encounter  Procedures   EKG 12-Lead   --Continue cardiac medications as reconciled in final medication list. --Return in about 3 weeks (around 02/13/2022) for Follow-up after direct-current cardioversion.. Or sooner if needed. --Continue follow-up with your primary care physician regarding the management of your other chronic comorbid conditions.  Patient's questions and concerns were addressed to her satisfaction. She voices understanding of the instructions provided during this encounter.   This note was created using a voice recognition software as a result there may be grammatical errors inadvertently enclosed that do not reflect the nature of this encounter. Every attempt is made to correct such  errors.  Tessa Lerner, [place], [facility]  Pager: [phone] Office: [phone]

## 2022-01-25 ENCOUNTER — Encounter (HOSPITAL_COMMUNITY): Payer: Self-pay | Admitting: Cardiology

## 2022-01-31 ENCOUNTER — Encounter (HOSPITAL_COMMUNITY): Payer: Self-pay | Admitting: Cardiology

## 2022-02-02 ENCOUNTER — Other Ambulatory Visit (HOSPITAL_COMMUNITY): Payer: Self-pay | Admitting: Nurse Practitioner

## 2022-02-05 ENCOUNTER — Other Ambulatory Visit: Payer: Self-pay

## 2022-02-05 MED ORDER — APIXABAN 5 MG PO TABS: 5.0000 mg | ORAL_TABLET | Freq: Two times a day (BID) | ORAL | 3 refills | Status: DC

## 2022-02-07 ENCOUNTER — Encounter (HOSPITAL_COMMUNITY)
Admission: RE | Disposition: A | Discharge status: Home / Self Care | Payer: Self-pay | Source: Ambulatory Visit | Attending: Cardiology

## 2022-02-07 ENCOUNTER — Ambulatory Visit (HOSPITAL_BASED_OUTPATIENT_CLINIC_OR_DEPARTMENT_OTHER): Payer: Medicare Other | Admitting: Anesthesiology

## 2022-02-07 ENCOUNTER — Ambulatory Visit (HOSPITAL_COMMUNITY)
Admission: RE | Admit: 2022-02-07 | Discharge: 2022-02-07 | Disposition: A | Discharge status: Home / Self Care | Payer: Medicare Other | Source: Ambulatory Visit | Attending: Cardiology | Admitting: Cardiology

## 2022-02-07 ENCOUNTER — Ambulatory Visit (HOSPITAL_COMMUNITY): Payer: Medicare Other | Admitting: Anesthesiology

## 2022-02-07 ENCOUNTER — Other Ambulatory Visit: Payer: Self-pay

## 2022-02-07 DIAGNOSIS — I38 Endocarditis, valve unspecified: Secondary | ICD-10-CM | POA: Diagnosis not present

## 2022-02-07 DIAGNOSIS — I6990 Unspecified sequelae of unspecified cerebrovascular disease: Secondary | ICD-10-CM

## 2022-02-07 DIAGNOSIS — I6523 Occlusion and stenosis of bilateral carotid arteries: Secondary | ICD-10-CM | POA: Diagnosis not present

## 2022-02-07 DIAGNOSIS — I480 Paroxysmal atrial fibrillation: Secondary | ICD-10-CM

## 2022-02-07 DIAGNOSIS — Z8673 Personal history of transient ischemic attack (TIA), and cerebral infarction without residual deficits: Secondary | ICD-10-CM | POA: Diagnosis not present

## 2022-02-07 DIAGNOSIS — Z79899 Other long term (current) drug therapy: Secondary | ICD-10-CM | POA: Insufficient documentation

## 2022-02-07 DIAGNOSIS — I4891 Unspecified atrial fibrillation: Secondary | ICD-10-CM

## 2022-02-07 DIAGNOSIS — F17210 Nicotine dependence, cigarettes, uncomplicated: Secondary | ICD-10-CM | POA: Insufficient documentation

## 2022-02-07 DIAGNOSIS — I10 Essential (primary) hypertension: Secondary | ICD-10-CM | POA: Diagnosis not present

## 2022-02-07 DIAGNOSIS — F17200 Nicotine dependence, unspecified, uncomplicated: Secondary | ICD-10-CM | POA: Diagnosis not present

## 2022-02-07 DIAGNOSIS — Z8249 Family history of ischemic heart disease and other diseases of the circulatory system: Secondary | ICD-10-CM | POA: Insufficient documentation

## 2022-02-07 DIAGNOSIS — I4819 Other persistent atrial fibrillation: Secondary | ICD-10-CM

## 2022-02-07 DIAGNOSIS — I4892 Unspecified atrial flutter: Secondary | ICD-10-CM | POA: Insufficient documentation

## 2022-02-07 HISTORY — PX: CARDIOVERSION: SHX1299

## 2022-02-07 LAB — POCT I-STAT, CHEM 8
BUN: 24 mg/dL — ABNORMAL HIGH (ref 8–23)
Calcium, Ion: 1.08 mmol/L — ABNORMAL LOW (ref 1.15–1.40)
Chloride: 105 mmol/L (ref 98–111)
Creatinine, Ser: 1.5 mg/dL — ABNORMAL HIGH (ref 0.44–1.00)
Glucose, Bld: 104 mg/dL — ABNORMAL HIGH (ref 70–99)
HCT: 43 % (ref 36.0–46.0)
Hemoglobin: 14.6 g/dL (ref 12.0–15.0)
Potassium: 4.6 mmol/L (ref 3.5–5.1)
Sodium: 139 mmol/L (ref 135–145)
TCO2: 24 mmol/L (ref 22–32)

## 2022-02-07 SURGERY — CARDIOVERSION
Anesthesia: General

## 2022-02-07 MED ORDER — LIDOCAINE 2% (20 MG/ML) 5 ML SYRINGE
INTRAMUSCULAR | Status: DC | PRN
  Administered 2022-02-07: 60 mg via INTRAVENOUS

## 2022-02-07 MED ORDER — METOPROLOL SUCCINATE ER 25 MG PO TB24: 25.0000 mg | ORAL_TABLET | Freq: Every day | ORAL | 0 refills | Status: DC

## 2022-02-07 MED ORDER — EPHEDRINE SULFATE-NACL 50-0.9 MG/10ML-% IV SOSY
PREFILLED_SYRINGE | INTRAVENOUS | Status: DC | PRN
  Administered 2022-02-07 (×2): 10 mg via INTRAVENOUS

## 2022-02-07 MED ORDER — MULTAQ 400 MG PO TABS: 400.0000 mg | ORAL_TABLET | Freq: Two times a day (BID) | ORAL | 0 refills | Status: DC

## 2022-02-07 MED ORDER — SODIUM CHLORIDE 0.9 % IV SOLN
INTRAVENOUS | Status: AC | PRN
  Administered 2022-02-07: 500 mL via INTRAVENOUS

## 2022-02-07 MED ORDER — SODIUM CHLORIDE 0.9 % IV SOLN: INTRAVENOUS | Status: DC

## 2022-02-07 MED ORDER — PROPOFOL 10 MG/ML IV BOLUS
INTRAVENOUS | Status: DC | PRN
  Administered 2022-02-07: 60 mg via INTRAVENOUS

## 2022-02-07 NOTE — Interval H&P Note (Signed)
History and Physical Interval Note:  02/07/2022 1:05 PM  Mary Ortiz  has presented today for surgery, with the diagnosis of AFIB.  The various methods of treatment have been discussed with the patient and family. After consideration of risks, benefits and other options for treatment, the patient has consented to  Procedure(s): CARDIOVERSION (N/A) as a surgical intervention.  The patient's history has been reviewed, patient examined, no change in status, stable for surgery.  I have reviewed the patient's chart and labs.  Questions were answered to the patient's satisfaction.    Risks, benefits, and alternatives of direct current cardioversion reviewed with the and patient. Risk includes but not limited to: potential for post-cardioversion rhythms, life-threatening arrhythmias (ventricular tachycardia and fibrillation, profound bradycardia, cardiac arrest), myocardial damage, acute pulmonary edema, skin burns, transient hypotension. Benefits include restoration of sinus rhythm. Alternatives to treatment were discussed, questions were answered, patient voices understanding and provides verbal feedback.  Patient is willing to proceed.   Has been taking Eliquis without any missed doses.   Labs reviewed.   Consent signed.    Tessa Lerner, [place], [facility]  Pager: [phone] Office: [phone]

## 2022-02-07 NOTE — Anesthesia Preprocedure Evaluation (Addendum)
Anesthesia Evaluation  Patient identified by MRN, date of birth, ID band Patient awake    Reviewed: Allergy & Precautions, NPO status , Patient's Chart, lab work & pertinent test results, reviewed documented beta blocker date and time   History of Anesthesia Complications Negative for: history of anesthetic complications  Airway Mallampati: II  TM Distance: >3 FB Neck ROM: Full    Dental  (+) Edentulous Upper, Edentulous Lower   Pulmonary Current Smoker,    Pulmonary exam normal        Cardiovascular hypertension, Pt. on home beta blockers and Pt. on medications Normal cardiovascular exam+ dysrhythmias Atrial Fibrillation   TTE 09/2021: EF 60-65%, grade II DD, RV systolic function mildly reduced, mildly elevated PASP 42.80mmHg, mild RAE/LAE, mild to moderate MR/TR, mild AR    Neuro/Psych CVA (09/2021), Residual Symptoms negative psych ROS   GI/Hepatic negative GI ROS, Neg liver ROS,   Endo/Other  negative endocrine ROS  Renal/GU negative Renal ROS  negative genitourinary   Musculoskeletal negative musculoskeletal ROS (+)   Abdominal   Peds  Hematology negative hematology ROS (+)   Anesthesia Other Findings Day of surgery medications reviewed with patient.  Reproductive/Obstetrics negative OB ROS                            Anesthesia Physical Anesthesia Plan  ASA: 3  Anesthesia Plan: General   Post-op Pain Management: Minimal or no pain anticipated   Induction: Intravenous  PONV Risk Score and Plan: 3 and Treatment may vary due to age or medical condition and Propofol infusion  Airway Management Planned: Mask  Additional Equipment: None  Intra-op Plan:   Post-operative Plan:   Informed Consent: I have reviewed the patients History and Physical, chart, labs and discussed the procedure including the risks, benefits and alternatives for the proposed anesthesia with the patient or  authorized representative who has indicated his/her understanding and acceptance.       Plan Discussed with: CRNA  Anesthesia Plan Comments:        Anesthesia Quick Evaluation

## 2022-02-07 NOTE — Anesthesia Postprocedure Evaluation (Signed)
Anesthesia Post Note  Patient: Mary Ortiz  Procedure(s) Performed: CARDIOVERSION     Patient location during evaluation: PACU Anesthesia Type: General Level of consciousness: awake and alert Pain management: pain level controlled Vital Signs Assessment: post-procedure vital signs reviewed and stable Respiratory status: spontaneous breathing, nonlabored ventilation and respiratory function stable Cardiovascular status: blood pressure returned to baseline Postop Assessment: no apparent nausea or vomiting Anesthetic complications: no   No notable events documented.  Last Vitals:  Vitals:   02/07/22 1335 02/07/22 1345  BP: (!) 99/46 (!) 107/43  Pulse: (!) 51 (!) 55  Resp: 19 20  Temp:    SpO2: 93% 93%    Last Pain:  Vitals:   02/07/22 1325  TempSrc: Temporal  PainSc: 0-No pain                 Shanda Howells

## 2022-02-07 NOTE — Transfer of Care (Signed)
Immediate Anesthesia Transfer of Care Note  Patient: Mary Ortiz  Procedure(s) Performed: CARDIOVERSION  Patient Location: Endoscopy Unit  Anesthesia Type:General  Level of Consciousness: awake and drowsy  Airway & Oxygen Therapy: Patient Spontanous Breathing  Post-op Assessment: Report given to RN and Post -op Vital signs reviewed and stable  Post vital signs: Reviewed and stable  Last Vitals:  Vitals Value Taken Time  BP 106/45 (63)   Temp    Pulse 52   Resp 16   SpO2 95     Last Pain:  Vitals:   02/07/22 1220  TempSrc: Temporal  PainSc: 0-No pain         Complications: No notable events documented.

## 2022-02-07 NOTE — CV Procedure (Signed)
Direct current cardioversion:  Indications:  Atrial Fibrillation  Procedure Details:  Consent: Risks of procedure as well as the alternatives and risks of each were explained to the (patient/caregiver).  Consent for procedure obtained.  Time Out: Verified patient identification, verified procedure, site/side was marked, verified correct patient position, special equipment/implants available, medications/allergies/relevent history reviewed, required imaging and test results available. PERFORMED.  Patient placed on cardiac monitor, pulse oximetry, supplemental oxygen as necessary.  Sedation given:  refer to anesthesia records Pacer pads placed anterior and posterior chest.  Cardioverted 1 time(s).  Cardioversion with synchronized biphasic 200J shock.  Evaluation: Findings: Post procedure EKG shows:  sinus bradycardia w/ PVCs Complications: None Patient did tolerate procedure well. Patient's niece Sheffield Slider updated postprocedure. Decreased Toprol-XL to 25 mg p.o. daily with holding parameters. Start Multaq. Follow-up in the office in 2 weeks.  Tajanae Guilbault 02/07/2022, 1:23 PM

## 2022-02-07 NOTE — Discharge Instructions (Signed)

## 2022-02-07 NOTE — Anesthesia Procedure Notes (Signed)
Procedure Name: General with mask airway Date/Time: 02/07/2022 1:11 PM  Performed by: Maxine Glenn, CRNAPre-anesthesia Checklist: Patient identified, Emergency Drugs available, Suction available and Patient being monitored Patient Re-evaluated:Patient Re-evaluated prior to induction Oxygen Delivery Method: Ambu bag Preoxygenation: Pre-oxygenation with 100% oxygen Induction Type: IV induction Dental Injury: Teeth and Oropharynx as per pre-operative assessment

## 2022-02-08 ENCOUNTER — Other Ambulatory Visit: Payer: Self-pay

## 2022-02-08 MED ORDER — MULTAQ 400 MG PO TABS: 400.0000 mg | ORAL_TABLET | Freq: Two times a day (BID) | ORAL | 0 refills | Status: DC

## 2022-02-09 ENCOUNTER — Encounter (HOSPITAL_COMMUNITY): Payer: Self-pay | Admitting: Cardiology

## 2022-02-12 ENCOUNTER — Ambulatory Visit (INDEPENDENT_AMBULATORY_CARE_PROVIDER_SITE_OTHER): Payer: Medicare Other

## 2022-02-12 DIAGNOSIS — I4819 Other persistent atrial fibrillation: Secondary | ICD-10-CM | POA: Diagnosis not present

## 2022-02-13 LAB — CUP PACEART REMOTE DEVICE CHECK
Date Time Interrogation Session: 20230807152603
Implantable Pulse Generator Implant Date: 20230320
Pulse Gen Serial Number: 175562

## 2022-02-14 ENCOUNTER — Other Ambulatory Visit: Payer: Self-pay

## 2022-02-14 MED ORDER — MULTAQ 400 MG PO TABS: 400.0000 mg | ORAL_TABLET | Freq: Two times a day (BID) | ORAL | 0 refills | Status: DC

## 2022-02-15 ENCOUNTER — Ambulatory Visit: Payer: Medicare Other | Admitting: Cardiology

## 2022-02-23 ENCOUNTER — Ambulatory Visit: Payer: Medicare Other | Admitting: Cardiology

## 2022-02-23 ENCOUNTER — Encounter: Payer: Self-pay | Admitting: Cardiology

## 2022-02-23 VITALS — BP 153/57 | HR 56 | Temp 97.8°F | Resp 16 | Ht 62.0 in | Wt 155.0 lb

## 2022-02-23 DIAGNOSIS — I480 Paroxysmal atrial fibrillation: Secondary | ICD-10-CM

## 2022-02-23 DIAGNOSIS — I10 Essential (primary) hypertension: Secondary | ICD-10-CM

## 2022-02-23 DIAGNOSIS — I63512 Cerebral infarction due to unspecified occlusion or stenosis of left middle cerebral artery: Secondary | ICD-10-CM

## 2022-02-23 DIAGNOSIS — I5031 Acute diastolic (congestive) heart failure: Secondary | ICD-10-CM

## 2022-02-23 DIAGNOSIS — Z7901 Long term (current) use of anticoagulants: Secondary | ICD-10-CM

## 2022-02-23 DIAGNOSIS — I6523 Occlusion and stenosis of bilateral carotid arteries: Secondary | ICD-10-CM

## 2022-02-23 MED ORDER — EMPAGLIFLOZIN 10 MG PO TABS: 10.0000 mg | ORAL_TABLET | Freq: Every day | ORAL | 0 refills | Status: AC

## 2022-02-23 NOTE — Progress Notes (Signed)
Mary Ortiz Date of Birth: 01-23-1941 MRN: [id number] Primary Care Provider:Varadarajan, Soyla Murphy, MD Former Cardiology Providers: Dr. Florian Buff Primary Cardiologist: Tessa Lerner, DO, [facility] (established care 01/27/2020)  Date: 02/23/22 Last Office Visit: 01/23/2022  Chief Complaint  Patient presents with   Post cardioversion   Follow-up    HPI  Mary Ortiz is a 81 y.o.  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  In March 2023 she was admitted with focal neurological deficits.  Noted to have left MCA infarct due to left M1 occlusion status post IR with TICI 3 likely embolic pattern.  He subsequently underwent loop recorder implantation prior to discharge and posthospitalization was notified of atrial fibrillation via her loop recorder.  She was seen in atrial fibrillation clinic and started on anticoagulation for thromboembolic prophylaxis.   At last office visit the shared decision was to proceed with direct-current cardioversion.  She underwent procedure on February 07, 2022 and converted to sinus bradycardia with PVCs with 200 J x 1.  At the time of discharge Toprol XL was reduced to 25 mg p.o. daily with holding parameters and started on Multaq.  She now presents for follow-up visit.  Since the cardioversion patient states that she is feeling more short of breath, predominately with effort related activities, tired/fatigue, lower extremity swelling, weight gain.  No anginal discomfort.  Denies orthopnea or PND.  Home blood pressures are ranging between 116-126 mmHg and her apple iWatch alerted her at night for bradycardia.  She was not able to afford Multaq and therefore is not on antiarrhythmic medications.  Patient is also reduced her smoking since last  office visit.  She is congratulated on her efforts.  Family history of premature coronary artery disease: younger sister had her MI at age of 34.   FUNCTIONAL STATUS: No structured exercise program or daily routine.    ALLERGIES: Allergies  Allergen Reactions   Meperidine Hcl Nausea Only   Penicillins Other (See Comments)    Pt states ineffective   Bacitracin-Polymyxin B Rash   Neo-Bacit-Poly-Lidocaine Rash   Tetanus-Diphtheria Toxoids Td Rash    MEDICATION LIST PRIOR TO VISIT: Current Outpatient Medications on File Prior to Visit  Medication Sig Dispense Refill   acetaminophen (TYLENOL) 500 MG tablet Take 500 mg by mouth every 6 (six) hours as needed for mild pain.     apixaban (ELIQUIS) 5 MG TABS tablet Take 1 tablet (5 mg total) by mouth 2 (two) times daily. 60 tablet 3   atorvastatin (LIPITOR) 20 MG tablet Take 1 tablet (20 mg total) by mouth at bedtime. 90 tablet 0   Cholecalciferol (VITAMIN D3) 50 MCG (2000 UT) TABS Take 2,000 Units by mouth daily.     furosemide (LASIX) 20 MG tablet Take 1 tablet by mouth once daily 90 tablet 0   meclizine (ANTIVERT) 12.5 MG tablet Take 12.5 mg by mouth as needed for dizziness.     metoprolol succinate (TOPROL XL) 25 MG 24 hr tablet Take 1 tablet (25 mg total) by mouth daily. Hold if systolic blood pressure (top number) less than 100 mmHg or pulse less than 55 bpm. 90 tablet 0   olmesartan (BENICAR) 20 MG tablet Take 20 mg by mouth daily.     No current facility-administered medications on file prior to visit.    PAST MEDICAL HISTORY: Past Medical  History:  Diagnosis Date   Atrial fibrillation (HCC)    Carotid artery stenosis    HTN (hypertension) 01/23/2019   Hyperlipidemia    Stroke [facility])     PAST SURGICAL HISTORY: Past Surgical History:  Procedure Laterality Date   BACK SURGERY     1977   CARDIOVERSION N/A 02/07/2022   Procedure: CARDIOVERSION;  Surgeon: Tessa Lerner, DO;  Location: [facility] ENDOSCOPY;  Service: Cardiovascular;   Laterality: N/A;   CATARACT EXTRACTION, BILATERAL     2019, 2016   Hystertectomy  1983   IR CT HEAD LTD  09/22/2021   IR PERCUTANEOUS ART THROMBECTOMY/INFUSION INTRACRANIAL INC DIAG ANGIO  09/22/2021   LOOP RECORDER INSERTION N/A 09/25/2021   Procedure: LOOP RECORDER INSERTION;  Surgeon: Lanier Prude, MD;  Location: [facility] INVASIVE CV LAB;  Service: Cardiovascular;  Laterality: N/A;   RADIOLOGY WITH ANESTHESIA N/A 09/22/2021   Procedure: IR WITH ANESTHESIA;  Surgeon: Radiologist, Medication, MD;  Location: [facility] OR;  Service: Radiology;  Laterality: N/A;    FAMILY HISTORY: The patient's family history includes Heart attack in her brother, father, and mother; Kidney disease in her brother; Liver cancer in her brother; Suicidality in her sister.   SOCIAL HISTORY:  The patient  reports that she has been smoking cigarettes. She has been smoking an average of .25 packs per day. She has never used smokeless tobacco. She reports that she does not drink alcohol and does not use drugs.  Review of Systems  Constitutional: Negative for chills and fever.  HENT:  Negative for hoarse voice and nosebleeds.   Eyes:  Negative for discharge, double vision and pain.  Cardiovascular:  Negative for chest pain, claudication, dyspnea on exertion, leg swelling, near-syncope, orthopnea, palpitations, paroxysmal nocturnal dyspnea and syncope.  Respiratory:  Negative for hemoptysis and shortness of breath.   Musculoskeletal:  Negative for muscle cramps and myalgias.  Gastrointestinal:  Negative for abdominal pain, constipation, diarrhea, hematemesis, hematochezia, melena, nausea and vomiting.  Neurological:  Negative for dizziness and light-headedness.    PHYSICAL EXAM:    02/23/2022    2:59 PM 02/23/2022    2:51 PM 02/07/2022    1:55 PM  Vitals with BMI  Height  5\' 2"    Weight  155 lbs   BMI  28.34   Systolic 153 166  Diastolic 57 59 38  Pulse 56 42 53   Physical Exam  Constitutional: No distress.  Age  appropriate, hemodynamically stable.   Neck: No JVD present.  Cardiovascular: Regular rhythm, S1 normal, S2 normal, intact distal pulses and normal pulses. Bradycardia present. Exam reveals no gallop, no S3 and no S4.  Murmur heard. Systolic murmur is present with a grade of 3/6 at the lower left sternal border. Pulses:      Carotid pulses are  on the left side with bruit. Pulmonary/Chest: Effort normal and breath sounds normal. No stridor. She has no wheezes. She has no rales.  Abdominal: Soft. Bowel sounds are normal. She exhibits no distension. There is no abdominal tenderness.  Musculoskeletal:        General: Edema present.     Cervical back: Neck supple.  Neurological: She is alert and oriented to person, place, and time. She has intact cranial nerves (2-12).  Skin: Skin is warm and moist.   CARDIAC DATABASE: EKG: 12/05/2021: Atrial flutter, 115 bpm, right bundle branch block 01/23/2022: Atrial flutter, 83 bpm, right bundle branch block.  02/23/2022: Sinus Bradycardia, 55bpm, RBBB, Right axis, nonspecific T-abnormality.  Direct-current cardioversion: 02/07/2022:  200 J x 1 converted to sinus bradycardia with PVCs.  Echocardiogram: 02/09/2020:  Left ventricle cavity is normal in size. Moderate concentric hypertrophy of the left ventricle. Normal global wall motion. Normal LV systolic function with visual EF 50-55%. Doppler evidence of grade II (pseudonormal) diastolic dysfunction, elevated LAP.  Left atrial cavity is mildly dilated.  Trileaflet aortic valve.  Moderate (Grade II) aortic regurgitation.  Mild to moderate mitral regurgitation.  Mild tricuspid regurgitation. Estimated pulmonary artery systolic pressure 34 mmHg.  No significant change compared tp previous study in 02/2019.   09/22/2021:  1. Left ventricular ejection fraction, by estimation, is 60 to 65%. The left ventricle has normal function. The left ventricle has no regional wall motion abnormalities. Left ventricular  diastolic parameters are consistent with Grade II diastolic dysfunction (pseudonormalization).   2. Right ventricular systolic function is mildly reduced. The right ventricular size is normal. There is mildly elevated pulmonary artery systolic pressure. The estimated right ventricular systolic pressure is  42.1 mmHg.   3. Left atrial size was mildly dilated.   4. Right atrial size was mildly dilated.   5. The mitral valve is normal in structure. Mild to moderate mitral valve regurgitation. No evidence of mitral stenosis.   6. Tricuspid valve regurgitation is moderate.   7. The aortic valve is tricuspid. Aortic valve regurgitation is mild. No aortic stenosis is present.   8. The inferior vena cava is normal in size with <50% respiratory variability, suggesting right atrial pressure of 8 mmHg.   Stress Testing:  None  Heart Catheterization: None  Carotid duplex: 02/09/2020:  Stenosis in the right internal carotid artery (50-69%). Stenosis in the right external carotid artery (<50%).  Stenosis in the left internal carotid artery (50-69%). Stenosis in the left external carotid artery (<50%).  Antegrade right vertebral artery flow. Antegrade left vertebral artery flow.  Follow up in six months is appropriate if clinically indicated.   LABORATORY DATA: Lipid Panel     Component Value Date/Time   CHOL 110 01/15/2022 0914   TRIG 84 01/15/2022 0914   HDL 56 01/15/2022 0914   CHOLHDL 3.0 09/23/2021 0726   VLDL 25 09/23/2021 0726   LDLCALC 37 01/15/2022 0914   LDLDIRECT 40 01/15/2022 0914   LABVLDL 17 01/15/2022 0914   IMPRESSION:    ICD-10-CM   1. Acute heart failure with preserved ejection fraction (HFpEF) (HCC)  I50.31 Pro b natriuretic peptide (BNP)    Basic metabolic panel    Magnesium    empagliflozin (JARDIANCE) 10 MG TABS tablet    2. Paroxysmal atrial fibrillation (HCC)  I48.0 EKG 12-Lead    3. Long term (current) use of anticoagulants  Z79.01     4. Essential  hypertension  I10     5. Cerebrovascular accident (CVA) due to occlusion of left middle cerebral artery (HCC)  I63.512     6. Atherosclerosis of both carotid arteries  I65.23        RECOMMENDATIONS: Mary BroodJannie L Ortiz is a 81 y.o. 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 Check BMP, NT proBNP, magnesium level. Start Jardiance 10 mg p.o. daily.  2-week samples provided to the patient.  Repeat labs in 1 week to reevaluate kidney function.  Patient has no prior history of urinary  tract infections.  Patient advised to call the office if [name] is too expensive and see if she qualifies for patient assistance. Currently on olmesartan, Lasix, metoprolol. We discussed transitioning ARB to Entresto-patient is motivated but concerned with regards to cost. Discontinue amlodipine due to lower extremity swelling. May consider enrolling her into PCM/RPM  Paroxysmal atrial fibrillation (HCC) Rate control: Metoprolol. Rhythm control: N/A. Thromboembolic prophylaxis: Eliquis. CHA2DS2-VASc SCORE is 7 which correlates to 9.6% risk of stroke per year (hypertension, age, CVA, aortic atherosclerosis, gender).  Diagnosed in March 2023 via loop recorder Direct-current cardioversion 02/07/2022 200 J x 1 converted to sinus bradycardia 2-week follow-up EKG illustrates sinus bradycardia.  Long term (current) use of anticoagulants Indication paroxysmal atrial fibrillation. He does not endorse evidence of bleeding. Risks, benefits, alternatives to anticoagulation re discussed at today's office visit.  Patient verbalizes understanding.  Essential hypertension Office blood pressure not well  controlled. Patient states at home blood pressures are very well controlled as noted above Medication changes discussed above  Cerebrovascular accident (CVA) due to occlusion of left middle cerebral artery [facility]) Index event March 2023. CTA head and neck: Left M1 occlusion and bilateral carotid bulbs note atherosclerosis less than 50%.  MRI left insular cortex, BG and CR small infarcts MRA left M1 patent, bilateral proximal P1 moderate stenosis. 2D Echo EF 60 to 65% LE venous Doppler no DVT LDL 50 HgbA1c 5.3 Educated the importance of secondary prevention.  Atherosclerosis of both carotid arteries In March 2023 had a CTA head and neck which noted bilateral carotid atherosclerosis due to noncalcified plaque. We will postpone the carotid duplex for March 2024, orders placed.  Smoking Continues to smoke less than half a pack per day. Benefits of complete smoking cessation discussed with the patient. We will continue to monitor.  FINAL MEDICATION LIST END OF ENCOUNTER: Meds ordered this encounter  Medications   empagliflozin (JARDIANCE) 10 MG TABS tablet    Sig: Take 1 tablet (10 mg total) by mouth daily before breakfast.    Dispense:  90 tablet    Refill:  0    Medications Discontinued During This Encounter  Medication Reason   dronedarone (MULTAQ) 400 MG tablet    ELIQUIS 5 MG TABS tablet    amLODipine (NORVASC) 10 MG tablet       Current Outpatient Medications:    acetaminophen (TYLENOL) 500 MG tablet, Take 500 mg by mouth every 6 (six) hours as needed for mild pain., Disp: , Rfl:    apixaban (ELIQUIS) 5 MG TABS tablet, Take 1 tablet (5 mg total) by mouth 2 (two) times daily., Disp: 60 tablet, Rfl: 3   atorvastatin (LIPITOR) 20 MG tablet, Take 1 tablet (20 mg total) by mouth at bedtime., Disp: 90 tablet, Rfl: 0   Cholecalciferol (VITAMIN D3) 50 MCG (2000 UT) TABS, Take 2,000 Units by mouth daily., Disp: , Rfl:    empagliflozin (JARDIANCE) 10 MG TABS tablet, Take 1 tablet  (10 mg total) by mouth daily before breakfast., Disp: 90 tablet, Rfl: 0   furosemide (LASIX) 20 MG tablet, Take 1 tablet by mouth once daily, Disp: 90 tablet, Rfl: 0   meclizine (ANTIVERT) 12.5 MG tablet, Take 12.5 mg by mouth as needed for dizziness., Disp: , Rfl:    metoprolol succinate (TOPROL XL) 25 MG 24 hr tablet, Take 1 tablet (25 mg total) by mouth daily. Hold if systolic blood pressure (top number) less than 100 mmHg or pulse less than 55 bpm., Disp: 90 tablet, Rfl: 0   olmesartan (  BENICAR) 20 MG tablet, Take 20 mg by mouth daily., Disp: , Rfl:   Orders Placed This Encounter  Procedures   Pro b natriuretic peptide (BNP)   Basic metabolic panel   Magnesium   EKG 12-Lead   --Continue cardiac medications as reconciled in final medication list. --Return in about 2 weeks (around 03/09/2022) for Follow up, heart failure management.. Or sooner if needed. --Continue follow-up with your primary care physician regarding the management of your other chronic comorbid conditions.  Patient's questions and concerns were addressed to her satisfaction. She voices understanding of the instructions provided during this encounter.   This note was created using a voice recognition software as a result there may be grammatical errors inadvertently enclosed that do not reflect the nature of this encounter. Every attempt is made to correct such errors.  Tessa Lerner, [place], [facility]  Pager: [phone] Office: [phone]

## 2022-02-24 LAB — BASIC METABOLIC PANEL
BUN/Creatinine Ratio: 14 (ref 12–28)
BUN: 19 mg/dL (ref 8–27)
CO2: 23 mmol/L (ref 20–29)
Calcium: 9.6 mg/dL (ref 8.7–10.3)
Chloride: 102 mmol/L (ref 96–106)
Creatinine, Ser: 1.39 mg/dL — ABNORMAL HIGH (ref 0.57–1.00)
Glucose: 100 mg/dL — ABNORMAL HIGH (ref 70–99)
Potassium: 4 mmol/L (ref 3.5–5.2)
Sodium: 140 mmol/L (ref 134–144)
eGFR: 38 mL/min/{1.73_m2} — ABNORMAL LOW (ref >59)

## 2022-02-24 LAB — MAGNESIUM: Magnesium: 2 mg/dL (ref 1.6–2.3)

## 2022-02-24 LAB — PRO B NATRIURETIC PEPTIDE: NT-Pro BNP: 3162 pg/mL — ABNORMAL HIGH (ref 0–738)

## 2022-03-01 ENCOUNTER — Other Ambulatory Visit: Payer: Self-pay | Admitting: Cardiology

## 2022-03-01 DIAGNOSIS — I5031 Acute diastolic (congestive) heart failure: Secondary | ICD-10-CM

## 2022-03-01 DIAGNOSIS — Z7901 Long term (current) use of anticoagulants: Secondary | ICD-10-CM

## 2022-03-01 MED ORDER — TORSEMIDE 10 MG PO TABS: 10.0000 mg | ORAL_TABLET | Freq: Every morning | ORAL | 2 refills | Status: DC

## 2022-03-01 NOTE — Progress Notes (Signed)
Telephone encounter:  Reviewed the labs from 02/23/2022. Patient has lost 5 pounds since last office visit. Discontinue furosemide. Start torsemide 10 mg p.o. every morning Labs in 1 week to evaluate kidney function and electrolytes and NT proBNP. Patient is asked to call her insurance company to see how much Sherryll Burger would cost her on her current drug plan. I will see her in the office next week.  Mayte Diers [place], DO, [facility]

## 2022-03-06 ENCOUNTER — Other Ambulatory Visit: Payer: Self-pay | Admitting: Cardiology

## 2022-03-06 DIAGNOSIS — I6523 Occlusion and stenosis of bilateral carotid arteries: Secondary | ICD-10-CM

## 2022-03-06 DIAGNOSIS — I63512 Cerebral infarction due to unspecified occlusion or stenosis of left middle cerebral artery: Secondary | ICD-10-CM

## 2022-03-08 ENCOUNTER — Ambulatory Visit: Payer: Medicare Other | Admitting: Cardiology

## 2022-03-10 LAB — BASIC METABOLIC PANEL
BUN/Creatinine Ratio: 16 (ref 12–28)
BUN: 22 mg/dL (ref 8–27)
CO2: 26 mmol/L (ref 20–29)
Calcium: 9.2 mg/dL (ref 8.7–10.3)
Chloride: 102 mmol/L (ref 96–106)
Creatinine, Ser: 1.4 mg/dL — ABNORMAL HIGH (ref 0.57–1.00)
Glucose: 108 mg/dL — ABNORMAL HIGH (ref 70–99)
Potassium: 5 mmol/L (ref 3.5–5.2)
Sodium: 140 mmol/L (ref 134–144)
eGFR: 37 mL/min/{1.73_m2} — ABNORMAL LOW (ref >59)

## 2022-03-10 LAB — MAGNESIUM: Magnesium: 2.3 mg/dL (ref 1.6–2.3)

## 2022-03-10 LAB — PRO B NATRIURETIC PEPTIDE: NT-Pro BNP: 2412 pg/mL — ABNORMAL HIGH (ref 0–738)

## 2022-03-10 LAB — HEMOGLOBIN AND HEMATOCRIT, BLOOD
Hematocrit: 33.5 % — ABNORMAL LOW (ref 34.0–46.6)
Hemoglobin: 11.7 g/dL (ref 11.1–15.9)

## 2022-03-14 NOTE — Progress Notes (Signed)
[place] Loop Recorder  

## 2022-03-15 ENCOUNTER — Ambulatory Visit (INDEPENDENT_AMBULATORY_CARE_PROVIDER_SITE_OTHER): Payer: Medicare Other

## 2022-03-15 DIAGNOSIS — I63512 Cerebral infarction due to unspecified occlusion or stenosis of left middle cerebral artery: Secondary | ICD-10-CM

## 2022-03-20 ENCOUNTER — Telehealth: Payer: Self-pay

## 2022-03-20 LAB — CUP PACEART REMOTE DEVICE CHECK
Date Time Interrogation Session: 20230911075944
Implantable Pulse Generator Implant Date: 20230320
Pulse Gen Serial Number: 175562

## 2022-03-20 NOTE — Telephone Encounter (Signed)
Follow Up:    Patient says she is returning Mary Ortiz's call from today.

## 2022-03-20 NOTE — Telephone Encounter (Signed)
Patient returned phone call.   Patient denies any symptoms. Requested patient send manual transmission to assess presenting rhythm.   Unable to get transmission to process. BS phone number given to patient to call for assistance.

## 2022-03-20 NOTE — Telephone Encounter (Signed)
CV Remote Solution Alert~  ILR summary report received. Battery status OK. Normal device function. No new symptom, tachy, episodes.  Monthly summary reports and ROV/PRN 35 pause events recorded up to 8sec, majority nocturnal 21 AF events, burden 6%, Eliquis.    Pt had a successful DCCV 8/2.  Presenting ?AF. 10,257 brady events, significant drop in both day and noc HR's after DCCV No alerts since 8/7 document Route to triage.  Attempted to contact patient to assess considering brady presenting rhythm.  Patient did not answer, LMTCB. Called patients niece who is emergency contact and states she will try to get in touch with patient to call device clinic back.

## 2022-03-20 NOTE — Telephone Encounter (Signed)
Presenting rhythm. Patient aware of ED precautions and aware to call back if something if changes in the mean time.

## 2022-03-22 NOTE — Telephone Encounter (Signed)
Thanks for the update. She has an appt to see Dr. Lalla Brothers.   Regards,   Dr. Odis Hollingshead

## 2022-03-22 NOTE — Telephone Encounter (Signed)
Message sent to Dr. Odis Hollingshead requesting advisement on treatment for afib for Pt.    Awaiting response.  ILR nonbillable report received. Battery status OK. Normal device function. No new symptom, tachy, or pause episodes. One ongoing AF episode.  AF burden is 6% of the time, on OAC.  Presenting rhythm shows AF at 100-120 bpm.  There were numerous nocturnal bradycardia episodes that were 30-40 bpm.

## 2022-03-23 ENCOUNTER — Other Ambulatory Visit: Payer: Medicare Other

## 2022-03-23 NOTE — Progress Notes (Signed)
Called and spoke to patient she said she turned those in I will follow up and see if patient was approved and denied

## 2022-03-27 ENCOUNTER — Other Ambulatory Visit: Payer: Self-pay

## 2022-03-27 MED ORDER — SACUBITRIL-VALSARTAN 24-26 MG PO TABS: 1.0000 | ORAL_TABLET | Freq: Two times a day (BID) | ORAL | 3 refills | Status: DC

## 2022-03-29 ENCOUNTER — Ambulatory Visit: Payer: Medicare Other | Admitting: Cardiology

## 2022-03-30 ENCOUNTER — Encounter: Payer: Self-pay | Admitting: Cardiology

## 2022-03-30 ENCOUNTER — Ambulatory Visit: Payer: Medicare Other | Attending: Cardiology | Admitting: Cardiology

## 2022-03-30 ENCOUNTER — Encounter: Payer: Self-pay | Admitting: *Deleted

## 2022-03-30 VITALS — BP 132/72 | HR 59 | Ht 62.0 in | Wt 142.0 lb

## 2022-03-30 DIAGNOSIS — Z01818 Encounter for other preprocedural examination: Secondary | ICD-10-CM | POA: Diagnosis not present

## 2022-03-30 DIAGNOSIS — I63512 Cerebral infarction due to unspecified occlusion or stenosis of left middle cerebral artery: Secondary | ICD-10-CM

## 2022-03-30 DIAGNOSIS — R001 Bradycardia, unspecified: Secondary | ICD-10-CM

## 2022-03-30 DIAGNOSIS — I4819 Other persistent atrial fibrillation: Secondary | ICD-10-CM

## 2022-03-30 NOTE — Progress Notes (Signed)
Electrophysiology Office Follow up Visit Note:    Date:  03/30/2022   ID:  Mary Ortiz, DOB [birth year], MRN [id number]  PCP:  Mary Cha, MD  [facility] Cardiologist:  None  [facility] Electrophysiologist:  Vickie Epley, MD    Interval History:    Mary Ortiz is a 81 y.o. female who presents for a follow up visit.  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  She has subsequently been diagnosed with atrial fibrillation and started on Eliquis for stroke prophylaxis.  During recent loop recorder interrogations, there have been multiple pauses noted lasting up to 8 seconds.  She also has had multiple bradycardic episodes during the day and nighttime hours.  Today, she reports feeling "so-so." She complains of very frequent lightheadedness/dizziness that she describes as feeling "crazy in her head". She is also feeling unsteady. Generally her symptoms are improved in the mornings when she first wakes up. Throughout the day her symptoms gradually return and worsen. She denies any presyncope or loss of consciousness.  Per her smart watch, her heart rate may be as low as 32 bpm while sleeping at night.  She also expresses concerns today about her Eliquis and Jardiance being too cost prohibitive.  In her family, her mother had a pacemaker.  She denies any palpitations, chest pain, shortness of breath, or peripheral edema. No headaches, syncope, orthopnea, or PND.      Past Medical History:  Diagnosis Date   Atrial fibrillation ([place])    Carotid artery stenosis    HTN (hypertension) 01/23/2019   Hyperlipidemia    Stroke [facility])     Past Surgical History:  Procedure Laterality Date   BACK SURGERY     1977   CARDIOVERSION N/A 02/07/2022   Procedure: CARDIOVERSION;  Surgeon: Rex Kras, DO;  Location: [place] [facility];  Service: Cardiovascular;  Laterality: N/A;   CATARACT EXTRACTION, BILATERAL     2019, 2016   Hystertectomy   1983   IR CT HEAD LTD  09/22/2021   IR PERCUTANEOUS ART THROMBECTOMY/INFUSION INTRACRANIAL INC DIAG ANGIO  09/22/2021   LOOP RECORDER INSERTION N/A 09/25/2021   Procedure: LOOP RECORDER INSERTION;  Surgeon: Vickie Epley, MD;  Location: [place] [facility];  Service: Cardiovascular;  Laterality: N/A;   RADIOLOGY WITH ANESTHESIA N/A 09/22/2021   Procedure: IR WITH ANESTHESIA;  Surgeon: Radiologist, Medication, MD;  Location: [place];  Service: Radiology;  Laterality: N/A;    Current Medications: Current Meds  Medication Sig   acetaminophen (TYLENOL) 500 MG tablet Take 500 mg by mouth every 6 (six) hours as needed for mild pain.   apixaban (ELIQUIS) 5 MG TABS tablet Take 1 tablet (5 mg total) by mouth 2 (two) times daily.   atorvastatin (LIPITOR) 20 MG tablet TAKE 1 TABLET BY MOUTH AT BEDTIME   Cholecalciferol (VITAMIN D3) 50 MCG (2000 UT) TABS Take 2,000 Units by mouth daily.   empagliflozin (JARDIANCE) 10 MG TABS tablet Take 1 tablet (10 mg total) by mouth daily before breakfast.   meclizine (ANTIVERT) 12.5 MG tablet Take 12.5 mg by mouth as needed for dizziness.   metoprolol succinate (TOPROL XL) 25 MG 24 hr tablet Take 1 tablet (25 mg total) by mouth daily. Hold if systolic blood pressure (top number) less than 100 mmHg or pulse less than 55 bpm.   olmesartan (BENICAR) 20 MG tablet Take 20 mg by mouth daily.   torsemide (DEMADEX) 10 MG tablet Take 1 tablet (10 mg  total) by mouth every morning.     Allergies:   Meperidine hcl, Penicillins, Bacitracin-polymyxin b, Neo-bacit-poly-lidocaine, and Tetanus-diphtheria toxoids td   Social History   Socioeconomic History   Marital status: Widowed    Spouse name: Not on file   Number of children: 0   Years of education: Not on file   Highest education level: Not on file  Occupational History   Not on file  Tobacco Use   Smoking status: Every Day    Packs/day: 0.25    Types: Cigarettes   Smokeless tobacco: Never   Tobacco comments:     smokes 3 packs a week  Vaping Use   Vaping Use: Never used  Substance and Sexual Activity   Alcohol use: Never   Drug use: Never   Sexual activity: Not on file  Other Topics Concern   Not on file  Social History Narrative   Not on file   Social Determinants of Health   Financial Resource Strain: Not on file  Food Insecurity: Not on file  Transportation Needs: Not on file  Physical Activity: Not on file  Stress: Not on file  Social Connections: Not on file     Family History: The patient's family history includes Heart attack in her brother, father, and mother; Kidney disease in her brother; Liver cancer in her brother; Suicidality in her sister.  ROS:   Please see the history of present illness.    (+) Lightheadedness/Dizziness (+) Unsteadiness All other systems reviewed and are negative.  EKGs/Labs/Other Studies Reviewed:    The following studies were reviewed today:  09/25/2021  Loop Recorder Insertion: CONCLUSIONS:   1. Successful implantation of a [employer] Lux-Dx implantable loop recorder for a history of cryptogenic stroke  2. No early apparent complications.   09/22/2021  Echocardiogram: Sonographer Comments: Suboptimal parasternal window and echo performed with patient supine and on artificial respirator.  IMPRESSIONS    1. Left ventricular ejection fraction, by estimation, is 60 to 65%. The  left ventricle has normal function. The left ventricle has no regional  wall motion abnormalities. Left ventricular diastolic parameters are  consistent with Grade II diastolic  dysfunction (pseudonormalization).   2. Right ventricular systolic function is mildly reduced. The right  ventricular size is normal. There is mildly elevated pulmonary artery  systolic pressure. The estimated right ventricular systolic pressure is  42.1 mmHg.   3. Left atrial size was mildly dilated.   4. Right atrial size was mildly dilated.   5. The mitral valve is normal in  structure. Mild to moderate mitral valve  regurgitation. No evidence of mitral stenosis.   6. Tricuspid valve regurgitation is moderate.   7. The aortic valve is tricuspid. Aortic valve regurgitation is mild. No  aortic stenosis is present.   8. The inferior vena cava is normal in size with <50% respiratory  variability, suggesting right atrial pressure of 8 mmHg.    EKG:  EKG is personally reviewed.  03/30/2022:  sinus bradycardia with a competing accelerated junctional rhythm  Recent Labs: 09/25/2021: Platelets 120 01/15/2022: ALT 21 03/09/2022: BUN 22; Creatinine, Ser 1.40; Hemoglobin 11.7; Magnesium 2.3; NT-Pro BNP 2,412; Potassium 5.0; Sodium 140   Recent Lipid Panel    Component Value Date/Time   CHOL 110 01/15/2022 0914   TRIG 84 01/15/2022 0914   HDL 56 01/15/2022 0914   CHOLHDL 3.0 09/23/2021 0726   VLDL 25 09/23/2021 0726   LDLCALC 37 01/15/2022 0914   LDLDIRECT 40 01/15/2022 0914      Physical Exam:    VS:  BP 132/72   Pulse (!) 59   Ht 5\' 2"  (1.575 m)   Wt 142 lb (64.4 kg)   SpO2 94%   BMI 25.97 kg/m     Wt Readings from Last 3 Encounters:  03/30/22 142 lb (64.4 kg)  02/23/22 155 lb (70.3 kg)  02/07/22 143 lb (64.9 kg)     GEN: Well nourished, well developed in no acute distress HEENT: Normal NECK: No JVD; No carotid bruits LYMPHATICS: No lymphadenopathy CARDIAC: regular rhythm, bradycardic, no murmurs, rubs, gallops. Device pocket well healed. RESPIRATORY:  Clear to auscultation without rales, wheezing or rhonchi  ABDOMEN: Soft, non-tender, non-distended MUSCULOSKELETAL:  No edema; No deformity  SKIN: Warm and dry NEUROLOGIC:  Alert and oriented x 3 PSYCHIATRIC:  Normal affect        ASSESSMENT:    1. Bradycardia   2. Preop examination   3. Persistent atrial fibrillation ([place])   4. 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 She has evidence of significant sinus node dysfunction with pauses >8 seconds.   I have discussed options with the patient including PPM implant. I do think there is a very reasonable chance she will feel better with permanent pacing.  Risks, benefits, alternatives to PPM implantation were discussed in detail with the patient today. The patient understands that the risks include but are not limited to bleeding, infection, pneumothorax, perforation, tamponade, vascular damage, renal failure, MI, stroke, death, and lead dislodgement and wishes to proceed.  We will therefore schedule device implantation at the next available time.  Proceed with Biotronik dual chamber pacemaker, hold Eliquis 2 days prior.  #CVA #Hx of AF On eliquis. Low burden based on ILR recordings. Continue to monitor.  She did ask today about alternatives to anticoagulation given the excessive costs a/w AC. I discussed Watchman briefly with the patient. She will do some reading on this and we will discuss at future appointments.    Medication Adjustments/Labs and Tests Ordered: Current medicines are reviewed at length with the patient today.  Concerns regarding medicines are outlined above.   Orders Placed This Encounter  Procedures   CBC w/Diff   Basic Metabolic Panel (BMET)   EKG 12-Lead   No orders of the defined types were placed in this encounter.   I,Mathew Stumpf,acting as a Education administrator for Vickie Epley, MD.,have documented all relevant documentation on the behalf of Vickie Epley, MD,as directed by  Vickie Epley, MD while in the presence of Vickie Epley, MD.  I, Vickie Epley, MD, have reviewed all documentation for this visit. The documentation on 03/30/22 for the exam, diagnosis, procedures, and orders are all accurate and complete.   Signed, Lars Mage, MD,  [facility], [facility] 03/30/2022 9:21 PM    Electrophysiology [facility]

## 2022-03-30 NOTE — H&P (View-Only) (Signed)
Electrophysiology Office Follow up Visit Note:    Date:  03/30/2022   ID:  Mary Ortiz, DOB [birth year], MRN [id number]  PCP:  Leeroy Cha, MD  [facility] Cardiologist:  None  [facility] Electrophysiologist:  Vickie Epley, MD    Interval History:    Mary Ortiz is a 81 y.o. female who presents for a follow up visit.  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  She has subsequently been diagnosed with atrial fibrillation and started on Eliquis for stroke prophylaxis.  During recent loop recorder interrogations, there have been multiple pauses noted lasting up to 8 seconds.  She also has had multiple bradycardic episodes during the day and nighttime hours.  Today, she reports feeling "so-so." She complains of very frequent lightheadedness/dizziness that she describes as feeling "crazy in her head". She is also feeling unsteady. Generally her symptoms are improved in the mornings when she first wakes up. Throughout the day her symptoms gradually return and worsen. She denies any presyncope or loss of consciousness.  Per her smart watch, her heart rate may be as low as 32 bpm while sleeping at night.  She also expresses concerns today about her Eliquis and Jardiance being too cost prohibitive.  In her family, her mother had a pacemaker.  She denies any palpitations, chest pain, shortness of breath, or peripheral edema. No headaches, syncope, orthopnea, or PND.      Past Medical History:  Diagnosis Date   Atrial fibrillation ([place])    Carotid artery stenosis    HTN (hypertension) 01/23/2019   Hyperlipidemia    Stroke [facility])     Past Surgical History:  Procedure Laterality Date   BACK SURGERY     1977   CARDIOVERSION N/A 02/07/2022   Procedure: CARDIOVERSION;  Surgeon: Rex Kras, DO;  Location: [place] [facility];  Service: Cardiovascular;  Laterality: N/A;   CATARACT EXTRACTION, BILATERAL     2019, 2016   Hystertectomy   1983   IR CT HEAD LTD  09/22/2021   IR PERCUTANEOUS ART THROMBECTOMY/INFUSION INTRACRANIAL INC DIAG ANGIO  09/22/2021   LOOP RECORDER INSERTION N/A 09/25/2021   Procedure: LOOP RECORDER INSERTION;  Surgeon: Vickie Epley, MD;  Location: [place] [facility];  Service: Cardiovascular;  Laterality: N/A;   RADIOLOGY WITH ANESTHESIA N/A 09/22/2021   Procedure: IR WITH ANESTHESIA;  Surgeon: Radiologist, Medication, MD;  Location: [place];  Service: Radiology;  Laterality: N/A;    Current Medications: Current Meds  Medication Sig   acetaminophen (TYLENOL) 500 MG tablet Take 500 mg by mouth every 6 (six) hours as needed for mild pain.   apixaban (ELIQUIS) 5 MG TABS tablet Take 1 tablet (5 mg total) by mouth 2 (two) times daily.   atorvastatin (LIPITOR) 20 MG tablet TAKE 1 TABLET BY MOUTH AT BEDTIME   Cholecalciferol (VITAMIN D3) 50 MCG (2000 UT) TABS Take 2,000 Units by mouth daily.   empagliflozin (JARDIANCE) 10 MG TABS tablet Take 1 tablet (10 mg total) by mouth daily before breakfast.   meclizine (ANTIVERT) 12.5 MG tablet Take 12.5 mg by mouth as needed for dizziness.   metoprolol succinate (TOPROL XL) 25 MG 24 hr tablet Take 1 tablet (25 mg total) by mouth daily. Hold if systolic blood pressure (top number) less than 100 mmHg or pulse less than 55 bpm.   olmesartan (BENICAR) 20 MG tablet Take 20 mg by mouth daily.   torsemide (DEMADEX) 10 MG tablet Take 1 tablet (10 mg  total) by mouth every morning.     Allergies:   Meperidine hcl, Penicillins, Bacitracin-polymyxin b, Neo-bacit-poly-lidocaine, and Tetanus-diphtheria toxoids td   Social History   Socioeconomic History   Marital status: Widowed    Spouse name: Not on file   Number of children: 0   Years of education: Not on file   Highest education level: Not on file  Occupational History   Not on file  Tobacco Use   Smoking status: Every Day    Packs/day: 0.25    Types: Cigarettes   Smokeless tobacco: Never   Tobacco comments:     smokes 3 packs a week  Vaping Use   Vaping Use: Never used  Substance and Sexual Activity   Alcohol use: Never   Drug use: Never   Sexual activity: Not on file  Other Topics Concern   Not on file  Social History Narrative   Not on file   Social Determinants of Health   Financial Resource Strain: Not on file  Food Insecurity: Not on file  Transportation Needs: Not on file  Physical Activity: Not on file  Stress: Not on file  Social Connections: Not on file     Family History: The patient's family history includes Heart attack in her brother, father, and mother; Kidney disease in her brother; Liver cancer in her brother; Suicidality in her sister.  ROS:   Please see the history of present illness.    (+) Lightheadedness/Dizziness (+) Unsteadiness All other systems reviewed and are negative.  EKGs/Labs/Other Studies Reviewed:    The following studies were reviewed today:  09/25/2021  Loop Recorder Insertion: CONCLUSIONS:   1. Successful implantation of a [employer] Lux-Dx implantable loop recorder for a history of cryptogenic stroke  2. No early apparent complications.   09/22/2021  Echocardiogram: Sonographer Comments: Suboptimal parasternal window and echo performed with patient supine and on artificial respirator.  IMPRESSIONS    1. Left ventricular ejection fraction, by estimation, is 60 to 65%. The  left ventricle has normal function. The left ventricle has no regional  wall motion abnormalities. Left ventricular diastolic parameters are  consistent with Grade II diastolic  dysfunction (pseudonormalization).   2. Right ventricular systolic function is mildly reduced. The right  ventricular size is normal. There is mildly elevated pulmonary artery  systolic pressure. The estimated right ventricular systolic pressure is  42.1 mmHg.   3. Left atrial size was mildly dilated.   4. Right atrial size was mildly dilated.   5. The mitral valve is normal in  structure. Mild to moderate mitral valve  regurgitation. No evidence of mitral stenosis.   6. Tricuspid valve regurgitation is moderate.   7. The aortic valve is tricuspid. Aortic valve regurgitation is mild. No  aortic stenosis is present.   8. The inferior vena cava is normal in size with <50% respiratory  variability, suggesting right atrial pressure of 8 mmHg.    EKG:  EKG is personally reviewed.  03/30/2022:  sinus bradycardia with a competing accelerated junctional rhythm  Recent Labs: 09/25/2021: Platelets 120 01/15/2022: ALT 21 03/09/2022: BUN 22; Creatinine, Ser 1.40; Hemoglobin 11.7; Magnesium 2.3; NT-Pro BNP 2,412; Potassium 5.0; Sodium 140   Recent Lipid Panel    Component Value Date/Time   CHOL 110 01/15/2022 0914   TRIG 84 01/15/2022 0914   HDL 56 01/15/2022 0914   CHOLHDL 3.0 09/23/2021 0726   VLDL 25 09/23/2021 0726   LDLCALC 37 01/15/2022 0914   LDLDIRECT 40 01/15/2022 0914  Physical Exam:    VS:  BP 132/72   Pulse (!) 59   Ht 5\' 2"  (1.575 m)   Wt 142 lb (64.4 kg)   SpO2 94%   BMI 25.97 kg/m     Wt Readings from Last 3 Encounters:  03/30/22 142 lb (64.4 kg)  02/23/22 155 lb (70.3 kg)  02/07/22 143 lb (64.9 kg)     GEN: Well nourished, well developed in no acute distress HEENT: Normal NECK: No JVD; No carotid bruits LYMPHATICS: No lymphadenopathy CARDIAC: regular rhythm, bradycardic, no murmurs, rubs, gallops. Device pocket well healed. RESPIRATORY:  Clear to auscultation without rales, wheezing or rhonchi  ABDOMEN: Soft, non-tender, non-distended MUSCULOSKELETAL:  No edema; No deformity  SKIN: Warm and dry NEUROLOGIC:  Alert and oriented x 3 PSYCHIATRIC:  Normal affect        ASSESSMENT:    1. Bradycardia   2. Preop examination   3. Persistent atrial fibrillation ([place])   4. 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 She has evidence of significant sinus node dysfunction with pauses >8 seconds.   I have discussed options with the patient including PPM implant. I do think there is a very reasonable chance she will feel better with permanent pacing.  Risks, benefits, alternatives to PPM implantation were discussed in detail with the patient today. The patient understands that the risks include but are not limited to bleeding, infection, pneumothorax, perforation, tamponade, vascular damage, renal failure, MI, stroke, death, and lead dislodgement and wishes to proceed.  We will therefore schedule device implantation at the next available time.  Proceed with Biotronik dual chamber pacemaker, hold Eliquis 2 days prior.  #CVA #Hx of AF On eliquis. Low burden based on ILR recordings. Continue to monitor.  She did ask today about alternatives to anticoagulation given the excessive costs a/w AC. I discussed Watchman briefly with the patient. She will do some reading on this and we will discuss at future appointments.    Medication Adjustments/Labs and Tests Ordered: Current medicines are reviewed at length with the patient today.  Concerns regarding medicines are outlined above.   Orders Placed This Encounter  Procedures   CBC w/Diff   Basic Metabolic Panel (BMET)   EKG 12-Lead   No orders of the defined types were placed in this encounter.   I,Mathew Stumpf,acting as a Education administrator for Vickie Epley, MD.,have documented all relevant documentation on the behalf of Vickie Epley, MD,as directed by  Vickie Epley, MD while in the presence of Vickie Epley, MD.  I, Vickie Epley, MD, have reviewed all documentation for this visit. The documentation on 03/30/22 for the exam, diagnosis, procedures, and orders are all accurate and complete.   Signed, Lars Mage, MD,  [facility], [facility] 03/30/2022 9:21 PM    Electrophysiology [facility]

## 2022-03-30 NOTE — Patient Instructions (Signed)
Medication Instructions:  none *If you need a refill on your cardiac medications before your next appointment, please call your pharmacy*   Lab Work: none If you have labs (blood work) drawn today and your tests are completely normal, you will receive your results only by: [place] (if you have MyChart) OR A paper copy in the mail If you have any lab test that is abnormal or we need to change your treatment, we will call you to review the results.   Testing/Procedures: Your physician has recommended that you have a pacemaker inserted. A pacemaker is a small device that is placed under the skin of your chest or abdomen to help control abnormal heart rhythms. This device uses electrical pulses to prompt the heart to beat at a normal rate. Pacemakers are used to treat heart rhythms that are too slow. Wire (leads) are attached to the pacemaker that goes into the chambers of you heart. This is done in the hospital and usually requires and overnight stay. Please see the instruction sheet given to you today for more information.    Follow-Up: At [facility], you and your health needs are our priority.  As part of our continuing mission to provide you with exceptional heart care, we have created designated Provider Care Teams.  These Care Teams include your primary Cardiologist (physician) and Advanced Practice Providers (APPs -  Physician Assistants and Nurse Practitioners) who all work together to provide you with the care you need, when you need it.  We recommend signing up for the patient portal called "MyChart".  Sign up information is provided on this After Visit Summary.  MyChart is used to connect with patients for Virtual Visits (Telemedicine).  Patients are able to view lab/test results, encounter notes, upcoming appointments, etc.  Non-urgent messages can be sent to your provider as well.   To learn more about what you can do with MyChart, go to [email or web address].     Your next appointment:   See instruction letter.  Important Information About Sugar

## 2022-03-31 LAB — CBC WITH DIFFERENTIAL/PLATELET
Basophils Absolute: 0.1 10*3/uL (ref 0.0–0.2)
Basos: 1 %
EOS (ABSOLUTE): 0.2 10*3/uL (ref 0.0–0.4)
Eos: 2 %
Hematocrit: 38.9 % (ref 34.0–46.6)
Hemoglobin: 12.7 g/dL (ref 11.1–15.9)
Immature Grans (Abs): 0 10*3/uL (ref 0.0–0.1)
Immature Granulocytes: 0 %
Lymphocytes Absolute: 2.2 10*3/uL (ref 0.7–3.1)
Lymphs: 25 %
MCH: 30.8 pg (ref 26.6–33.0)
MCHC: 32.6 g/dL (ref 31.5–35.7)
MCV: 94 fL (ref 79–97)
Monocytes Absolute: 0.5 10*3/uL (ref 0.1–0.9)
Monocytes: 6 %
Neutrophils Absolute: 5.7 10*3/uL (ref 1.4–7.0)
Neutrophils: 66 %
Platelets: 211 10*3/uL (ref 150–450)
RBC: 4.13 x10E6/uL (ref 3.77–5.28)
RDW: 12.9 % (ref 11.7–15.4)
WBC: 8.6 10*3/uL (ref 3.4–10.8)

## 2022-03-31 LAB — BASIC METABOLIC PANEL
BUN/Creatinine Ratio: 14 (ref 12–28)
BUN: 23 mg/dL (ref 8–27)
CO2: 22 mmol/L (ref 20–29)
Calcium: 9.8 mg/dL (ref 8.7–10.3)
Chloride: 98 mmol/L (ref 96–106)
Creatinine, Ser: 1.62 mg/dL — ABNORMAL HIGH (ref 0.57–1.00)
Glucose: 94 mg/dL (ref 70–99)
Potassium: 4.6 mmol/L (ref 3.5–5.2)
Sodium: 139 mmol/L (ref 134–144)
eGFR: 31 mL/min/{1.73_m2} — ABNORMAL LOW (ref >59)

## 2022-03-31 NOTE — Progress Notes (Signed)
Carelink Summary Report / Loop Recorder 

## 2022-04-04 ENCOUNTER — Ambulatory Visit: Payer: Medicare Other

## 2022-04-10 ENCOUNTER — Other Ambulatory Visit: Payer: Self-pay

## 2022-04-10 DIAGNOSIS — I5031 Acute diastolic (congestive) heart failure: Secondary | ICD-10-CM

## 2022-04-10 NOTE — Progress Notes (Signed)
Called and spoke to patient she voiced understanding orders have been placed and released

## 2022-04-11 ENCOUNTER — Ambulatory Visit: Payer: Medicare Other

## 2022-04-11 ENCOUNTER — Telehealth: Payer: Self-pay | Admitting: *Deleted

## 2022-04-11 NOTE — Telephone Encounter (Signed)
Patient notified on new procedure arrival time for Oct 5 at 11 am.  Verbalized understanding and agreement.

## 2022-04-11 NOTE — Pre-Procedure Instructions (Signed)
Instructed patient on the following items: Arrival time 1100 Nothing to eat or drink after midnight No meds AM of procedure Responsible person to drive you home and stay with you for 24 hrs Wash with special soap night before and morning of procedure If on anti-coagulant drug instructions Eliquis- last dose 10/2

## 2022-04-12 ENCOUNTER — Ambulatory Visit: Payer: Medicare Other | Admitting: Cardiology

## 2022-04-12 ENCOUNTER — Encounter (HOSPITAL_COMMUNITY)
Admission: RE | Disposition: A | Discharge status: Home / Self Care | Payer: Medicare Other | Source: Home / Self Care | Attending: Cardiology

## 2022-04-12 ENCOUNTER — Ambulatory Visit (HOSPITAL_COMMUNITY): Payer: Medicare Other

## 2022-04-12 ENCOUNTER — Other Ambulatory Visit: Payer: Self-pay

## 2022-04-12 ENCOUNTER — Ambulatory Visit (HOSPITAL_COMMUNITY)
Admission: RE | Admit: 2022-04-12 | Discharge: 2022-04-12 | Disposition: A | Discharge status: Home / Self Care | Payer: Medicare Other | Attending: Cardiology | Admitting: Cardiology

## 2022-04-12 DIAGNOSIS — I495 Sick sinus syndrome: Secondary | ICD-10-CM | POA: Diagnosis present

## 2022-04-12 DIAGNOSIS — Z8673 Personal history of transient ischemic attack (TIA), and cerebral infarction without residual deficits: Secondary | ICD-10-CM | POA: Insufficient documentation

## 2022-04-12 DIAGNOSIS — Z7901 Long term (current) use of anticoagulants: Secondary | ICD-10-CM | POA: Insufficient documentation

## 2022-04-12 DIAGNOSIS — I4819 Other persistent atrial fibrillation: Secondary | ICD-10-CM | POA: Insufficient documentation

## 2022-04-12 HISTORY — PX: LOOP RECORDER REMOVAL: EP1215

## 2022-04-12 HISTORY — PX: PACEMAKER IMPLANT: EP1218

## 2022-04-12 SURGERY — PACEMAKER IMPLANT

## 2022-04-12 MED ORDER — CHLORHEXIDINE GLUCONATE 4 % EX LIQD
4.0000 | Freq: Once | CUTANEOUS | Status: DC
  Filled 2022-04-12: qty 60

## 2022-04-12 MED ORDER — ACETAMINOPHEN 325 MG PO TABS
325.0000 mg | ORAL_TABLET | ORAL | Status: DC | PRN
  Administered 2022-04-12: 650 mg via ORAL
  Filled 2022-04-12: qty 2

## 2022-04-12 MED ORDER — HEPARIN (PORCINE) IN NACL 1000-0.9 UT/500ML-% IV SOLN
INTRAVENOUS | Status: DC | PRN
  Administered 2022-04-12: 500 mL

## 2022-04-12 MED ORDER — LIDOCAINE HCL (PF) 1 % IJ SOLN
INTRAMUSCULAR | Status: AC
  Filled 2022-04-12: qty 60

## 2022-04-12 MED ORDER — HEPARIN (PORCINE) IN NACL 1000-0.9 UT/500ML-% IV SOLN
INTRAVENOUS | Status: AC
  Filled 2022-04-12: qty 500

## 2022-04-12 MED ORDER — FENTANYL CITRATE (PF) 100 MCG/2ML IJ SOLN
INTRAMUSCULAR | Status: DC | PRN
  Administered 2022-04-12 (×2): 25 ug via INTRAVENOUS

## 2022-04-12 MED ORDER — SODIUM CHLORIDE 0.9 % IV SOLN: INTRAVENOUS | Status: DC

## 2022-04-12 MED ORDER — MIDAZOLAM HCL 5 MG/5ML IJ SOLN
INTRAMUSCULAR | Status: AC
  Filled 2022-04-12: qty 5

## 2022-04-12 MED ORDER — MIDAZOLAM HCL 5 MG/5ML IJ SOLN
INTRAMUSCULAR | Status: DC | PRN
  Administered 2022-04-12 (×2): 1 mg via INTRAVENOUS

## 2022-04-12 MED ORDER — POVIDONE-IODINE 10 % EX SWAB
2.0000 | Freq: Once | CUTANEOUS | Status: AC
  Administered 2022-04-12: 2 via TOPICAL

## 2022-04-12 MED ORDER — FENTANYL CITRATE (PF) 100 MCG/2ML IJ SOLN
INTRAMUSCULAR | Status: AC
  Filled 2022-04-12: qty 2

## 2022-04-12 MED ORDER — SODIUM CHLORIDE 0.9 % IV SOLN
INTRAVENOUS | Status: AC
  Filled 2022-04-12: qty 2

## 2022-04-12 MED ORDER — SODIUM CHLORIDE 0.9 % IV SOLN
80.0000 mg | INTRAVENOUS | Status: AC
  Administered 2022-04-12: 80 mg

## 2022-04-12 MED ORDER — LIDOCAINE HCL (PF) 1 % IJ SOLN
INTRAMUSCULAR | Status: DC | PRN
  Administered 2022-04-12: 60 mL

## 2022-04-12 MED ORDER — VANCOMYCIN HCL IN DEXTROSE 1-5 GM/200ML-% IV SOLN
1000.0000 mg | INTRAVENOUS | Status: AC
  Administered 2022-04-12: 1000 mg via INTRAVENOUS

## 2022-04-12 MED ORDER — VANCOMYCIN HCL IN DEXTROSE 1-5 GM/200ML-% IV SOLN
INTRAVENOUS | Status: AC
  Filled 2022-04-12: qty 200

## 2022-04-12 MED ORDER — ONDANSETRON HCL 4 MG/2ML IJ SOLN: 4.0000 mg | Freq: Four times a day (QID) | INTRAMUSCULAR | Status: DC | PRN

## 2022-04-12 SURGICAL SUPPLY — 11 items
CABLE SURGICAL S-101-97-12 (CABLE) ×1 IMPLANT
KIT ACCESSORY SELECTRA FIX CVD (MISCELLANEOUS) IMPLANT
LEAD SELECTRA 3D-55-42 (CATHETERS) IMPLANT
LEAD SOLIA S PRO MRI 53 (Lead) IMPLANT
LEAD SOLIA S PRO MRI 60 (Lead) IMPLANT
PACEMAKER EDORA 8DR-T MRI (Pacemaker) IMPLANT
PAD DEFIB RADIO PHYSIO CONN (PAD) ×1 IMPLANT
SHEATH 7FR PRELUDE SNAP 13 (SHEATH) IMPLANT
SHEATH 9FR PRELUDE SNAP 13 (SHEATH) IMPLANT
SHEATH PROBE COVER 6X72 (BAG) IMPLANT
TRAY PACEMAKER INSERTION (PACKS) ×1 IMPLANT

## 2022-04-12 NOTE — Discharge Instructions (Addendum)
After Your Pacemaker   You have a Biotronik Pacemaker  ACTIVITY Do not lift your arm above shoulder height for 1 week after your procedure. After 7 days, you may progress as below.  You should remove your sling 24 hours after your procedure, unless otherwise instructed by your provider.     Thursday April 19, 2022  Friday April 20, 2022 Saturday April 21, 2022 Sunday April 22, 2022   Do not lift, push, pull, or carry anything over 10 pounds with the affected arm until 6 weeks (Thursday May 24, 2022 ) after your procedure.   You may drive AFTER your wound check, unless you have been told otherwise by your provider.   Ask your healthcare provider when you can go back to work   INCISION/Dressing If you are on a blood thinner such as Coumadin, Xarelto, Eliquis, Plavix, or Pradaxa please confirm with your provider when this should be resumed.   If large square, outer bandage is left in place, this can be removed after 24 hours from your procedure. Do not remove steri-strips or glue as below.   Monitor your Pacemaker site for redness, swelling, and drainage. Call the device clinic at [phone] if you experience these symptoms or fever/chills.  If your incision is sealed with Steri-strips or staples, you may shower 7 days after your procedure or when told by your provider. Do not remove the steri-strips or let the shower hit directly on your site. You may wash around your site with soap and water.    If you were discharged in a sling, please do not wear this during the day more than 48 hours after your surgery unless otherwise instructed. This may increase the risk of stiffness and soreness in your shoulder.   Avoid lotions, ointments, or perfumes over your incision until it is well-healed.  You may use a hot tub or a pool AFTER your wound check appointment if the incision is completely closed.  Pacemaker Alerts:  Some alerts are vibratory and others beep. These are NOT  emergencies. Please call our office to let us know. If this occurs at night or on weekends, it can wait until the next business day. Send a remote transmission.  If your device is capable of reading fluid status (for heart failure), you will be offered monthly monitoring to review this with you.   DEVICE MANAGEMENT Remote monitoring is used to monitor your pacemaker from home. This monitoring is scheduled every 91 days by our office. It allows [place] to keep an eye on the functioning of your device to ensure it is working properly. You will routinely see your Electrophysiologist annually (more often if necessary).   You should receive your ID card for your new device in 4-8 weeks. Keep this card with you at all times once received. Consider wearing a medical alert bracelet or necklace.  Your Pacemaker may be MRI compatible. This will be discussed at your next office visit/wound check.  You should avoid contact with strong electric or magnetic fields.   Do not use amateur (ham) radio equipment or electric (arc) welding torches. MP3 player headphones with magnets should not be used. Some devices are safe to use if held at least 12 inches (30 cm) from your Pacemaker. These include power tools, lawn mowers, and speakers. If you are unsure if something is safe to use, ask your health care provider.  When using your cell phone, hold it to the ear that is on the opposite side from the  Pacemaker. Do not leave your cell phone in a pocket over the Pacemaker.  You may safely use electric blankets, heating pads, computers, and microwave ovens.  Call the office right away if: You have chest pain. You feel more short of breath than you have felt before. You feel more light-headed than you have felt before. Your incision starts to open up.  This information is not intended to replace advice given to you by your health care provider. Make sure you discuss any questions you have with your health care provider.

## 2022-04-12 NOTE — Interval H&P Note (Signed)
History and Physical Interval Note:  04/12/2022 12:08 PM  Mary Ortiz  has presented today for surgery, with the diagnosis of bradycardia.  The various methods of treatment have been discussed with the patient and family. After consideration of risks, benefits and other options for treatment, the patient has consented to  Procedure(s): PACEMAKER IMPLANT (N/A) as a surgical intervention.  The patient's history has been reviewed, patient examined, no change in status, stable for surgery.  I have reviewed the patient's chart and labs.  Questions were answered to the patient's satisfaction.     Mary Ortiz

## 2022-04-13 ENCOUNTER — Encounter (HOSPITAL_COMMUNITY): Payer: Self-pay | Admitting: Cardiology

## 2022-04-18 LAB — BASIC METABOLIC PANEL
BUN/Creatinine Ratio: 21 (ref 12–28)
BUN: 35 mg/dL — ABNORMAL HIGH (ref 8–27)
CO2: 23 mmol/L (ref 20–29)
Calcium: 9.8 mg/dL (ref 8.7–10.3)
Chloride: 95 mmol/L — ABNORMAL LOW (ref 96–106)
Creatinine, Ser: 1.65 mg/dL — ABNORMAL HIGH (ref 0.57–1.00)
Glucose: 105 mg/dL — ABNORMAL HIGH (ref 70–99)
Potassium: 6.4 mmol/L — ABNORMAL HIGH (ref 3.5–5.2)
Sodium: 132 mmol/L — ABNORMAL LOW (ref 134–144)
eGFR: 31 mL/min/{1.73_m2} — ABNORMAL LOW (ref >59)

## 2022-04-18 LAB — MAGNESIUM: Magnesium: 2.4 mg/dL — ABNORMAL HIGH (ref 1.6–2.3)

## 2022-04-18 LAB — PRO B NATRIURETIC PEPTIDE: NT-Pro BNP: 929 pg/mL — ABNORMAL HIGH (ref 0–738)

## 2022-04-19 ENCOUNTER — Ambulatory Visit: Payer: Medicare Other | Admitting: Cardiology

## 2022-04-19 ENCOUNTER — Ambulatory Visit (HOSPITAL_COMMUNITY)
Admission: EM | Admit: 2022-04-19 | Discharge: 2022-04-19 | Disposition: A | Discharge status: Home / Self Care | Payer: Medicare Other | Attending: Physician Assistant | Admitting: Physician Assistant

## 2022-04-19 ENCOUNTER — Encounter (HOSPITAL_COMMUNITY): Payer: Self-pay | Admitting: Emergency Medicine

## 2022-04-19 ENCOUNTER — Other Ambulatory Visit: Payer: Self-pay

## 2022-04-19 DIAGNOSIS — E875 Hyperkalemia: Secondary | ICD-10-CM

## 2022-04-19 DIAGNOSIS — E871 Hypo-osmolality and hyponatremia: Secondary | ICD-10-CM | POA: Diagnosis present

## 2022-04-19 DIAGNOSIS — R899 Unspecified abnormal finding in specimens from other organs, systems and tissues: Secondary | ICD-10-CM

## 2022-04-19 LAB — BASIC METABOLIC PANEL
Anion gap: 10 (ref 5–15)
BUN: 34 mg/dL — ABNORMAL HIGH (ref 8–23)
CO2: 26 mmol/L (ref 22–32)
Calcium: 9.5 mg/dL (ref 8.9–10.3)
Chloride: 95 mmol/L — ABNORMAL LOW (ref 98–111)
Creatinine, Ser: 1.7 mg/dL — ABNORMAL HIGH (ref 0.44–1.00)
GFR, Estimated: 30 mL/min — ABNORMAL LOW (ref >60)
Glucose, Bld: 102 mg/dL — ABNORMAL HIGH (ref 70–99)
Potassium: 4.7 mmol/L (ref 3.5–5.1)
Sodium: 131 mmol/L — ABNORMAL LOW (ref 135–145)

## 2022-04-19 NOTE — ED Triage Notes (Addendum)
Patient had labs drawn drawn 2 days ago.  Potassium is abnormal.  Pcp directed patient to [facility] for redraw of labs.

## 2022-04-19 NOTE — ED Provider Notes (Signed)
[place]    CSN: [id number] Arrival date & time: 04/19/22  1240      History   Chief Complaint Chief Complaint  Patient presents with   Labs Only    HPI Mary Ortiz is a 81 y.o. female.   Patient presents today at the request of her cardiologist for evaluation of hyperkalemia.  Reports that she had a pacemaker placed 04/12/2022.  She was seen by her cardiologist on 04/17/2022 at which point her potassium was elevated at 6.4.  He recommended that she be evaluated to have this rechecked.  She does take olmesartan but denies any additional potassium supplements or medications likely to cause hyperkalemia.  She denies any recent dietary change.  She denies any current symptoms including headache, dizziness, nausea, vomiting, weakness, palpitations.    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   Smoking 01/24/2019   Essential hypertension 01/23/2019    Past Surgical History:  Procedure Laterality Date   BACK SURGERY     1977   CARDIOVERSION N/A 02/07/2022   Procedure: CARDIOVERSION;  Surgeon: Rex Kras, DO;  Location: [facility];  Service: Cardiovascular;  Laterality: N/A;   CATARACT EXTRACTION, BILATERAL     2019, 2016   Hystertectomy  1983   IR CT HEAD LTD  09/22/2021   IR PERCUTANEOUS ART THROMBECTOMY/INFUSION INTRACRANIAL INC DIAG ANGIO  09/22/2021   LOOP RECORDER INSERTION N/A 09/25/2021   Procedure: LOOP RECORDER INSERTION;  Surgeon: Vickie Epley, MD;  Location: [place] [facility];  Service: Cardiovascular;  Laterality: N/A;   LOOP RECORDER REMOVAL N/A 04/12/2022   Procedure: LOOP RECORDER  REMOVAL;  Surgeon: Vickie Epley, MD;  Location: [place] [facility];  Service: Cardiovascular;  Laterality: N/A;   PACEMAKER IMPLANT N/A 04/12/2022   Procedure: PACEMAKER IMPLANT;  Surgeon: Vickie Epley, MD;  Location: [place] [facility];  Service: Cardiovascular;  Laterality: N/A;   RADIOLOGY WITH ANESTHESIA N/A 09/22/2021   Procedure: IR WITH ANESTHESIA;  Surgeon: Radiologist, Medication, MD;  Location: [place];  Service: Radiology;  Laterality: N/A;    OB History   No obstetric history on file.      Home Medications    Prior to Admission medications   Medication Sig Start Date End Date Taking? Authorizing Provider  acetaminophen (TYLENOL) 500 MG tablet Take 500 mg by mouth every 6 (six) hours as needed for mild pain.    [provider]  apixaban (ELIQUIS) 5 MG TABS tablet Take 1 tablet (5 mg total) by mouth 2 (two) times daily. 02/05/22   Tolia, Sunit, DO  atorvastatin (LIPITOR) 20 MG tablet TAKE 1 TABLET BY MOUTH AT BEDTIME 03/09/22   Tolia, Sunit, DO  Cholecalciferol (VITAMIN D3) 50 MCG (2000 UT) TABS Take 2,000 Units by mouth daily.    [provider]  empagliflozin (JARDIANCE) 10 MG TABS tablet Take 1 tablet (10 mg total) by mouth daily before breakfast. 02/23/22 05/24/22  Tolia, Sunit, DO  meclizine (ANTIVERT) 12.5 MG tablet Take 12.5 mg by mouth 3 (three) times daily as needed for dizziness.    [provider]  metoprolol  succinate (TOPROL XL) 25 MG 24 hr tablet Take 1 tablet (25 mg total) by mouth daily. Hold if systolic blood pressure (top number) less than 100 mmHg or pulse less than 55 bpm. 02/07/22 05/08/22  Tolia, Sunit, DO  olmesartan (BENICAR) 20 MG tablet Take 20 mg by mouth daily.    [provider]  sacubitril-valsartan (ENTRESTO) 24-26 MG Take 1 tablet by mouth 2 (two) times daily. 03/27/22   Tolia, Sunit, DO  torsemide (DEMADEX) 10 MG tablet Take 1 tablet (10 mg total) by mouth every morning. 03/01/22 05/30/22  Rex Kras, DO     Family History Family History  Problem Relation Age of Onset   Heart attack Mother    Heart attack Father    Heart attack Brother    Suicidality Sister    Liver cancer Brother    Kidney disease Brother     Social History Social History   Tobacco Use   Smoking status: Every Day    Packs/day: 0.25    Types: Cigarettes   Smokeless tobacco: Never   Tobacco comments:    smokes 3 packs a week  Vaping Use   Vaping Use: Never used  Substance Use Topics   Alcohol use: Never   Drug use: Never     Allergies   Demerol [meperidine hcl], Penicillins, Bacitracin-polymyxin b, Neo-bacit-poly-lidocaine, and Tetanus-diphtheria toxoids td   Review of Systems Review of Systems  Constitutional:  Negative for activity change, appetite change, fatigue and fever.  Respiratory:  Negative for cough and shortness of breath.   Cardiovascular:  Negative for chest pain, palpitations and leg swelling.  Gastrointestinal:  Negative for abdominal pain, diarrhea, nausea and vomiting.  Neurological:  Negative for dizziness, light-headedness and headaches.     Physical Exam Triage Vital Signs ED Triage Vitals  Enc Vitals Group     BP 04/19/22 1354 (!) 143/86     Pulse Rate 04/19/22 1354 (!) 109     Resp 04/19/22 1354 20     Temp 04/19/22 1354 98.2 F (36.8 C)     Temp Source 04/19/22 1354 Oral     SpO2 04/19/22 1354 95 %     Weight --      Height --      Head Circumference --      Peak Flow --      Pain Score 04/19/22 1351 0     Pain Loc --      Pain Edu? --      Excl. in [place]? --    No data found.  Updated Vital Signs BP (!) 143/86 (BP Location: Right Arm)   Pulse (!) 109   Temp 98.2 F (36.8 C) (Oral)   Resp 20   SpO2 95%   Visual Acuity Right Eye Distance:   Left Eye Distance:   Bilateral Distance:    Right Eye Near:   Left Eye Near:    Bilateral Near:     Physical Exam Vitals reviewed.  Constitutional:      General: She is awake. She is not in acute distress.     Appearance: Normal appearance. She is well-developed. She is not ill-appearing.     Comments: Very pleasant female appears stated age in no acute distress sitting comfortably in exam room  HENT:     Head: Normocephalic and atraumatic.  Cardiovascular:     Rate and Rhythm: Normal rate and regular rhythm.     Heart sounds: Normal heart sounds, S1 normal and S2 normal. No murmur  heard. Pulmonary:     Effort: Pulmonary effort is normal.     Breath sounds: Normal breath sounds. No wheezing, rhonchi or rales.     Comments: Clear to auscultation bilaterally Musculoskeletal:     Right lower leg: No edema.     Left lower leg: No edema.  Psychiatric:        Behavior: Behavior is cooperative.      UC Treatments / Results  Labs (all labs ordered are listed, but only abnormal results are displayed) Labs Reviewed  BASIC METABOLIC PANEL - Abnormal; Notable for the following components:      Result Value   Sodium 131 (*)    Chloride 95 (*)    Glucose, Bld 102 (*)    BUN 34 (*)    Creatinine, Ser 1.70 (*)    GFR, Estimated 30 (*)    All other components within normal limits    EKG   Radiology No results found.  Procedures Procedures (including critical care time)  Medications Ordered in UC Medications - No data to display  Initial Impression / Assessment and Plan / UC Course  I have reviewed the triage vital signs and the nursing notes.  Pertinent labs & imaging results that were available during my care of the patient were reviewed by me and considered in my medical decision making (see chart for details).     EKG obtained showed atrial flutter with ventricular rate of 86 bpm; compared to 04/12/2022 tracing no significant change.  BMP was obtained today which did show stable elevated creatinine.  Potassium normalized.  She continues to be hyponatremic with sodium of 131.  Recommended that she avoid excessive water intake and follow-up with her primary care for repeat labs.   Discussed that if she has any worsening symptoms including weakness, nausea, vomiting, confusion, dizziness she needs to go to the emergency room immediately.  Strict return precautions given.    Final Clinical Impressions(s) / UC Diagnoses   Final diagnoses:  Abnormal laboratory test  Serum potassium elevated  Hyponatremia     Discharge Instructions      Your EKG was stable compared to several days ago.  Your potassium normalized to 4.7.  You did have slightly low sodium.  Please avoid drinking excessive water and follow-up with your primary care first thing next week for recheck.  If you develop any headache, dizziness, weakness, nausea, vomiting, confusion you should go to the emergency room immediately.     ED Prescriptions   None    PDMP not reviewed this encounter.   Terrilee Croak, PA-C 04/19/22 1614

## 2022-04-19 NOTE — Discharge Instructions (Signed)
Your EKG was stable compared to several days ago.  Your potassium normalized to 4.7.  You did have slightly low sodium.  Please avoid drinking excessive water and follow-up with your primary care first thing next week for recheck.  If you develop any headache, dizziness, weakness, nausea, vomiting, confusion you should go to the emergency room immediately.

## 2022-04-22 ENCOUNTER — Other Ambulatory Visit: Payer: Self-pay | Admitting: Cardiology

## 2022-04-22 DIAGNOSIS — I6523 Occlusion and stenosis of bilateral carotid arteries: Secondary | ICD-10-CM

## 2022-04-23 NOTE — Progress Notes (Signed)
Called pt to inform her about her duplex, pt understood

## 2022-04-24 NOTE — Progress Notes (Unsigned)
Cardiology Office Note Date:  04/25/2022  Patient ID:  Mary, Ortiz 21-Jan-1941, MRN [id number] PCP:  Leeroy Cha, MD  Cardiologist:  Dr. Terri Skains Electrophysiologist: Dr. Quentin Ore     Chief Complaint:  wound check  History of Present Illness: Mary Ortiz is a 81 y.o. 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 She mentioned to Dr. Quentin Ore that Mary Ortiz and Mary Ortiz are significant cost burden to her.  She underwent PPM implant 04/12/22 and loop was removed as well.  TODAY She is doing quite well. Can't wait to take s hower! No CP, palpitations or cardiac awareness No SOB No near syncoe or syncope.  She notes since her PPM she feels so much better.  No bleeding or signs of bleeding  Device information Biotronik dual chamber PPM implanted 04/12/22  AAD Hx Multaq started Aug 2023 though too expensive and not taken  Past Medical History:  Diagnosis Date   Atrial fibrillation ([place])    Carotid artery stenosis    HTN (hypertension) 01/23/2019   Hyperlipidemia    Stroke [facility])     Past Surgical History:  Procedure Laterality Date   BACK SURGERY     1977   CARDIOVERSION N/A 02/07/2022   Procedure: CARDIOVERSION;  Surgeon: Rex Kras, DO;  Location: [place] [facility];  Service: Cardiovascular;  Laterality: N/A;   CATARACT EXTRACTION, BILATERAL     2019, 2016   Hystertectomy  1983   IR CT HEAD LTD  09/22/2021   IR PERCUTANEOUS ART THROMBECTOMY/INFUSION INTRACRANIAL INC DIAG ANGIO  09/22/2021   LOOP RECORDER INSERTION N/A 09/25/2021   Procedure: LOOP RECORDER INSERTION;  Surgeon: Vickie Epley, MD;  Location: [place] [facility];  Service: Cardiovascular;  Laterality: N/A;   LOOP RECORDER REMOVAL N/A 04/12/2022   Procedure: LOOP RECORDER REMOVAL;   Surgeon: Vickie Epley, MD;  Location: [place] [facility];  Service: Cardiovascular;  Laterality: N/A;   PACEMAKER IMPLANT N/A 04/12/2022   Procedure: PACEMAKER IMPLANT;  Surgeon: Vickie Epley, MD;  Location: [place] [facility];  Service: Cardiovascular;  Laterality: N/A;   RADIOLOGY WITH ANESTHESIA N/A 09/22/2021   Procedure: IR WITH ANESTHESIA;  Surgeon: Radiologist, Medication, MD;  Location: [place];  Service: Radiology;  Laterality: N/A;    Current Outpatient Medications  Medication Sig Dispense Refill   acetaminophen (TYLENOL) 500 MG tablet Take 500 mg by mouth every 6 (six) hours as needed for mild pain.     apixaban (Mary Ortiz) 5 MG TABS tablet Take 1 tablet (5 mg total) by mouth 2 (two) times daily. 60 tablet 3   atorvastatin (LIPITOR) 20 MG tablet TAKE 1 TABLET BY MOUTH AT BEDTIME 90 tablet 0   Cholecalciferol (VITAMIN D3) 50 MCG (2000 UT) TABS Take 2,000 Units by mouth daily.     empagliflozin (JARDIANCE) 10 MG TABS tablet Take 1 tablet (10 mg total) by mouth daily before breakfast. 90 tablet 0   meclizine (ANTIVERT) 12.5 MG tablet Take 12.5 mg by mouth 3 (three) times daily as needed for dizziness.     metoprolol succinate (TOPROL XL) 25 MG 24 hr tablet Take 1 tablet (25 mg total) by mouth daily. Hold if systolic blood pressure (top number) less than 100 mmHg or pulse less than 55 bpm. 90 tablet 0   olmesartan (BENICAR) 20 MG  tablet Take 20 mg by mouth daily.     torsemide (DEMADEX) 10 MG tablet Take 1 tablet (10 mg total) by mouth every morning. 30 tablet 2   sacubitril-valsartan (ENTRESTO) 24-26 MG Take 1 tablet by mouth 2 (two) times daily. (Patient not taking: Reported on 04/25/2022) 180 tablet 3   No current facility-administered medications for this visit.    Allergies:   Demerol [meperidine hcl], Penicillins, Bacitracin-polymyxin b, Neo-bacit-poly-lidocaine, and Tetanus-diphtheria toxoids td   Social History:  The patient  reports that she has been smoking cigarettes.  She has been smoking an average of .25 packs per day. She has never used smokeless tobacco. She reports that she does not drink alcohol and does not use drugs.   Family History:  The patient's family history includes Heart attack in her brother, father, and mother; Kidney disease in her brother; Liver cancer in her brother; Suicidality in her sister.  ROS:  Please see the history of present illness.    All other systems are reviewed and otherwise negative.   PHYSICAL EXAM:  VS:  BP 114/82   Pulse (!) 118   Ht 5\' 2"  (1.575 m)   Wt 141 lb (64 kg)   SpO2 94%   BMI 25.79 kg/m  BMI: Body mass index is 25.79 kg/m. Well nourished, well developed, in no acute distress HEENT: normocephalic, atraumatic Neck: no JVD, carotid bruits or masses Cardiac:   RRR; tachycardic, no significant murmurs, no rubs, or gallops Lungs:  CTA b/l, no wheezing, rhonchi or rales Abd: soft, nontender MS: no deformity or atrophy Ext: no edema Skin: warm and dry, no rash Neuro:  No gross deficits appreciated Psych: euthymic mood, full affect  PPM site is stable, steri strips are removed from both sites without difficulty.  Both sites note: skin edges are well approximated, no erythema, edema, fluctuation or heat, no no tethering or discomfort   EKG:  done today and reviewed by [facility] AFlutter, RBBB, 118bpm   Device interrogation done today and reviewed by myself:  Battery and lead measurements are good Acute lead outputs remain 100% AF Presents Aflutter hR histograms note she is not always fast though spends time 110's-130 range,   09/22/21: TTE  1. Left ventricular ejection fraction, by estimation, is 60 to 65%. The  left ventricle has normal function. The left ventricle has no regional  wall motion abnormalities. Left ventricular diastolic parameters are  consistent with Grade II diastolic  dysfunction (pseudonormalization).   2. Right ventricular systolic function is mildly reduced. The right   ventricular size is normal. There is mildly elevated pulmonary artery  systolic pressure. The estimated right ventricular systolic pressure is  42.1 mmHg.   3. Left atrial size was mildly dilated.   4. Right atrial size was mildly dilated.   5. The mitral valve is normal in structure. Mild to moderate mitral valve  regurgitation. No evidence of mitral stenosis.   6. Tricuspid valve regurgitation is moderate.   7. The aortic valve is tricuspid. Aortic valve regurgitation is mild. No  aortic stenosis is present.   8. The inferior vena cava is normal in size with <50% respiratory  variability, suggesting right atrial pressure of 8 mmHg.   Recent Labs: 01/15/2022: ALT 21 03/30/2022: Hemoglobin 12.7; Platelets 211 04/17/2022: Magnesium 2.4; NT-Pro BNP 929 04/19/2022: BUN 34; Creatinine, Ser 1.70; Potassium 4.7; Sodium 131  09/23/2021: Total CHOL/HDL Ratio 3.0; VLDL 25 01/15/2022: Cholesterol, Total 110; HDL 56; LDL Chol Calc (NIH) 37; LDL Direct 40; Triglycerides  84   Estimated Creatinine Clearance: 22 mL/min (A) (by C-G formula based on SCr of 1.7 mg/dL (H)).   Wt Readings from Last 3 Encounters:  04/25/22 141 lb (64 kg)  04/12/22 141 lb (64 kg)  03/30/22 142 lb (64.4 kg)     Other studies reviewed: Additional studies/records reviewed today include: summarized above  ASSESSMENT AND PLAN:  PPM Intact function No programming changes made Sites are well healed, no signs of infection  Paroxysmal Afib CHA2DS2Vasc is 7, on Mary Ortiz,  appropriately dosed 100 % burden  Rates not well controlled Increase her Toprol to 25mg  BID  Recheck on her BP is 120/68, she reports home BPs usually higher  She sees Dr. next week, will defer further management to him  HFpEF No symptoms or exam findings of volume OL  HTN Looks good   Disposition: F/u with Dr. Odis Hollingshead next week and Dr. Odis Hollingshead as scheduled, sooner if needed  Current medicines are reviewed at length with the patient  today.  The patient did not have any concerns regarding medicines.  Mary Brothers, PA-C 04/25/2022 2:45 PM     [facility] [address] [place] [phone] (office)  [phone] (fax)

## 2022-04-25 ENCOUNTER — Other Ambulatory Visit: Payer: Self-pay

## 2022-04-25 ENCOUNTER — Encounter: Payer: Self-pay | Admitting: Physician Assistant

## 2022-04-25 ENCOUNTER — Ambulatory Visit: Payer: Medicare Other | Attending: Physician Assistant | Admitting: Physician Assistant

## 2022-04-25 VITALS — BP 114/82 | HR 118 | Ht 62.0 in | Wt 141.0 lb

## 2022-04-25 DIAGNOSIS — Z950 Presence of cardiac pacemaker: Secondary | ICD-10-CM | POA: Diagnosis not present

## 2022-04-25 DIAGNOSIS — I4892 Unspecified atrial flutter: Secondary | ICD-10-CM | POA: Diagnosis not present

## 2022-04-25 DIAGNOSIS — I4819 Other persistent atrial fibrillation: Secondary | ICD-10-CM | POA: Diagnosis not present

## 2022-04-25 DIAGNOSIS — I6523 Occlusion and stenosis of bilateral carotid arteries: Secondary | ICD-10-CM

## 2022-04-25 DIAGNOSIS — I10 Essential (primary) hypertension: Secondary | ICD-10-CM

## 2022-04-25 DIAGNOSIS — I5032 Chronic diastolic (congestive) heart failure: Secondary | ICD-10-CM | POA: Diagnosis not present

## 2022-04-25 DIAGNOSIS — Z5189 Encounter for other specified aftercare: Secondary | ICD-10-CM

## 2022-04-25 LAB — CUP PACEART INCLINIC DEVICE CHECK
Date Time Interrogation Session: 20231018173547
Implantable Lead Implant Date: 20231005
Implantable Lead Implant Date: 20231005
Implantable Lead Location: 753859
Implantable Lead Location: 753860
Implantable Lead Model: 377
Implantable Lead Model: 377171
Implantable Lead Serial Number: 8000954610
Implantable Lead Serial Number: 8001083794
Implantable Pulse Generator Implant Date: 20231005
Lead Channel Pacing Threshold Amplitude: 1.3 V
Lead Channel Pacing Threshold Pulse Width: 0.4 ms
Lead Channel Sensing Intrinsic Amplitude: 1.1 mV
Lead Channel Sensing Intrinsic Amplitude: 14.4 mV
Pulse Gen Model: 407145
Pulse Gen Serial Number: 1000094218

## 2022-04-25 MED ORDER — METOPROLOL SUCCINATE ER 25 MG PO TB24: 25.0000 mg | ORAL_TABLET | Freq: Two times a day (BID) | ORAL | 2 refills | Status: DC

## 2022-04-25 NOTE — Patient Instructions (Signed)
Medication Instructions:   START TAKING TOPROL 25 MG TWICE A DAY   *If you need a refill on your cardiac medications before your next appointment, please call your pharmacy*   Lab Work: NONE ORDERED  TODAY    If you have labs (blood work) drawn today and your tests are completely normal, you will receive your results only by: [place] (if you have MyChart) OR A paper copy in the mail If you have any lab test that is abnormal or we need to change your treatment, we will call you to review the results.   Testing/Procedures: NONE ORDERED  TODAY    Follow-Up: At [facility], you and your health needs are our priority.  As part of our continuing mission to provide you with exceptional heart care, we have created designated Provider Care Teams.  These Care Teams include your primary Cardiologist (physician) and Advanced Practice Providers (APPs -  Physician Assistants and Nurse Practitioners) who all work together to provide you with the care you need, when you need it.  We recommend signing up for the patient portal called "MyChart".  Sign up information is provided on this After Visit Summary.  MyChart is used to connect with patients for Virtual Visits (Telemedicine).  Patients are able to view lab/test results, encounter notes, upcoming appointments, etc.  Non-urgent messages can be sent to your provider as well.   To learn more about what you can do with MyChart, go to [email or web address].    Your next appointment:  AS SCHEDULED    The format for your next appointment:   In Person  Provider:   Lars Mage, MD    Important Information About Sugar

## 2022-05-02 ENCOUNTER — Telehealth: Payer: Self-pay

## 2022-05-02 LAB — BASIC METABOLIC PANEL
BUN/Creatinine Ratio: 15 (ref 12–28)
BUN: 24 mg/dL (ref 8–27)
CO2: 22 mmol/L (ref 20–29)
Calcium: 9.5 mg/dL (ref 8.7–10.3)
Chloride: 99 mmol/L (ref 96–106)
Creatinine, Ser: 1.61 mg/dL — ABNORMAL HIGH (ref 0.57–1.00)
Glucose: 108 mg/dL — ABNORMAL HIGH (ref 70–99)
Potassium: 4.7 mmol/L (ref 3.5–5.2)
Sodium: 138 mmol/L (ref 134–144)
eGFR: 32 mL/min/{1.73_m2} — ABNORMAL LOW (ref >59)

## 2022-05-02 LAB — PRO B NATRIURETIC PEPTIDE: NT-Pro BNP: 2495 pg/mL — ABNORMAL HIGH (ref 0–738)

## 2022-05-02 LAB — MAGNESIUM: Magnesium: 2.4 mg/dL — ABNORMAL HIGH (ref 1.6–2.3)

## 2022-05-02 NOTE — Telephone Encounter (Signed)
Error

## 2022-05-03 ENCOUNTER — Ambulatory Visit: Payer: Medicare Other | Admitting: Cardiology

## 2022-05-03 ENCOUNTER — Encounter: Payer: Self-pay | Admitting: Cardiology

## 2022-05-03 VITALS — BP 133/82 | HR 116 | Temp 96.8°F | Resp 16 | Ht 62.0 in | Wt 140.6 lb

## 2022-05-03 DIAGNOSIS — I10 Essential (primary) hypertension: Secondary | ICD-10-CM

## 2022-05-03 DIAGNOSIS — I63512 Cerebral infarction due to unspecified occlusion or stenosis of left middle cerebral artery: Secondary | ICD-10-CM

## 2022-05-03 DIAGNOSIS — Z950 Presence of cardiac pacemaker: Secondary | ICD-10-CM

## 2022-05-03 DIAGNOSIS — I6523 Occlusion and stenosis of bilateral carotid arteries: Secondary | ICD-10-CM

## 2022-05-03 DIAGNOSIS — Z79899 Other long term (current) drug therapy: Secondary | ICD-10-CM

## 2022-05-03 DIAGNOSIS — Z7901 Long term (current) use of anticoagulants: Secondary | ICD-10-CM

## 2022-05-03 DIAGNOSIS — I5032 Chronic diastolic (congestive) heart failure: Secondary | ICD-10-CM

## 2022-05-03 DIAGNOSIS — I4891 Unspecified atrial fibrillation: Secondary | ICD-10-CM

## 2022-05-03 DIAGNOSIS — F17200 Nicotine dependence, unspecified, uncomplicated: Secondary | ICD-10-CM

## 2022-05-03 MED ORDER — SACUBITRIL-VALSARTAN 24-26 MG PO TABS: 1.0000 | ORAL_TABLET | Freq: Two times a day (BID) | ORAL | 3 refills | Status: DC

## 2022-05-03 MED ORDER — AMIODARONE HCL 200 MG PO TABS: 200.0000 mg | ORAL_TABLET | Freq: Two times a day (BID) | ORAL | 3 refills | Status: DC

## 2022-05-03 MED ORDER — APIXABAN 2.5 MG PO TABS: 2.5000 mg | ORAL_TABLET | Freq: Two times a day (BID) | ORAL | 0 refills | Status: DC

## 2022-05-03 NOTE — Progress Notes (Signed)
Mary Ortiz Date of Birth: 19-Sep-1940 MRN: [id number] Primary Care Provider:Varadarajan, Ronie Spies, MD Former Cardiology Providers: Dr. Vear Clock Primary Cardiologist: Rex Kras, DO, [facility] (established care 01/27/2020)  Date: 05/03/22 Last Office Visit: 02/23/2022  Chief Complaint  Patient presents with   heart failure management   Follow-up   Atrial Fibrillation    HPI  Mary Ortiz is a 81 y.o.  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  In March 2023 patient was diagnosed with left MCA infarct status post IR TICI 3 likely embolic pattern.  She underwent loop recorder implant prior to discharge and short thereafter was noted to be in A-fib.  She was started on anticoagulation for thromboembolic prophylaxis.  Given her episodes of A-fib with RVR she underwent direct-current cardioversion and converted to sinus bradycardia with frequent PVCs at 200 J x 1.  After the cardioversion she was recommended to start Multaq but due to it being cost prohibitive she never started the antiarrhythmic.  After cardioversion she was having symptoms of shortness of breath predominantly with effort related activities, lower extremity swelling, and weight gain.  She was started on treatment for HFpEF and symptomatically was improving.  However, her loop recorder continue to note prolonged pauses associated with dizziness and therefore underwent pacemaker implant for tachybradycardia syndrome.  Since the pacemaker patient states that she is feeling much better.  No chest pain.  Shortness of breath has significantly improved compared to prior with effort related  activities.  Denies any evidence of bleeding.  She has not started Entresto yet but did get patient assistance approved until the end of the year.  Patient is requesting either transitioning from Eliquis to Coumadin to cut down her cost or an alternative.  Unfortunately, despite history of stroke, HFpEF, pacemaker implant, she continues to smoke regularly.  FUNCTIONAL STATUS: No structured exercise program or daily routine.    ALLERGIES: Allergies  Allergen Reactions   Demerol [Meperidine Hcl] Nausea Only   Penicillins Other (See Comments)    Pt states ineffective   Bacitracin-Polymyxin B Rash   Neo-Bacit-Poly-Lidocaine Rash   Tetanus-Diphtheria Toxoids Td Rash    MEDICATION LIST PRIOR TO VISIT: Current Outpatient Medications on File Prior to Visit  Medication Sig Dispense Refill   acetaminophen (TYLENOL) 500 MG tablet Take 500 mg by mouth every 6 (six) hours as needed for mild pain.     atorvastatin (LIPITOR) 20 MG tablet TAKE 1 TABLET BY MOUTH AT BEDTIME 90 tablet 0   Cholecalciferol (VITAMIN D3) 50 MCG (2000 UT) TABS Take 2,000 Units by mouth daily.     empagliflozin (JARDIANCE) 10 MG TABS tablet Take 1 tablet (10 mg total) by mouth daily before breakfast. 90 tablet 0   meclizine (ANTIVERT) 12.5 MG tablet Take 12.5 mg by mouth 3 (three) times daily as needed for dizziness.     metoprolol succinate (TOPROL XL) 25 MG 24 hr tablet Take 1 tablet (25 mg total) by mouth in the morning and at bedtime. Hold if systolic blood pressure (top number) less than 100 mmHg.. 180 tablet 2   torsemide (DEMADEX) 10 MG tablet Take 1 tablet (10 mg total) by mouth every morning. 30 tablet 2   No current facility-administered medications on file prior to visit.    PAST  MEDICAL HISTORY: Past Medical History:  Diagnosis Date   Atrial fibrillation ([place])    Carotid artery stenosis    HTN (hypertension) 01/23/2019   Hyperlipidemia    Stroke [facility])     PAST SURGICAL HISTORY: Past Surgical History:   Procedure Laterality Date   BACK SURGERY     1977   CARDIOVERSION N/A 02/07/2022   Procedure: CARDIOVERSION;  Surgeon: Rex Kras, DO;  Location: [place] [facility];  Service: Cardiovascular;  Laterality: N/A;   CATARACT EXTRACTION, BILATERAL     2019, 2016   Hystertectomy  1983   IR CT HEAD LTD  09/22/2021   IR PERCUTANEOUS ART THROMBECTOMY/INFUSION INTRACRANIAL INC DIAG ANGIO  09/22/2021   LOOP RECORDER INSERTION N/A 09/25/2021   Procedure: LOOP RECORDER INSERTION;  Surgeon: Vickie Epley, MD;  Location: [place] CV LAB;  Service: Cardiovascular;  Laterality: N/A;   LOOP RECORDER REMOVAL N/A 04/12/2022   Procedure: LOOP RECORDER REMOVAL;  Surgeon: Vickie Epley, MD;  Location: [place] CV LAB;  Service: Cardiovascular;  Laterality: N/A;   PACEMAKER IMPLANT N/A 04/12/2022   Procedure: PACEMAKER IMPLANT;  Surgeon: Vickie Epley, MD;  Location: [place] CV LAB;  Service: Cardiovascular;  Laterality: N/A;   RADIOLOGY WITH ANESTHESIA N/A 09/22/2021   Procedure: IR WITH ANESTHESIA;  Surgeon: Radiologist, Medication, MD;  Location: [place];  Service: Radiology;  Laterality: N/A;    FAMILY HISTORY: The patient's family history includes Heart attack in her brother, father, and mother; Kidney disease in her brother; Liver cancer in her brother; Suicidality in her sister.   SOCIAL HISTORY:  The patient  reports that she has been smoking cigarettes. She has been smoking an average of .25 packs per day. She has never used smokeless tobacco. She reports that she does not drink alcohol and does not use drugs.  Review of Systems  Cardiovascular:  Positive for dyspnea on exertion. Negative for chest pain, claudication, irregular heartbeat, leg swelling, near-syncope, orthopnea, palpitations, paroxysmal nocturnal dyspnea and syncope.  Respiratory:  Negative for shortness of breath.   Hematologic/Lymphatic: Negative for bleeding problem.  Musculoskeletal:  Negative for muscle cramps and  myalgias.  Neurological:  Negative for dizziness and light-headedness.    PHYSICAL EXAM:    05/03/2022   10:51 AM 04/25/2022    2:31 PM 04/19/2022    1:54 PM  Vitals with BMI  Height 5\' 2"  5\' 2"    Weight 140 lbs 10 oz 141 lbs   BMI 0000000 [id number]   Systolic [id number]  Diastolic 82 82 86  Pulse [id number] 118 109   Physical Exam  Constitutional: No distress.  Age appropriate, hemodynamically stable.   Neck: No JVD present.  Cardiovascular: S1 normal, S2 normal, intact distal pulses and normal pulses. An irregular rhythm present. Tachycardia present. Exam reveals no gallop, no S3 and no S4.  Murmur heard. Systolic murmur is present with a grade of 3/6 at the lower left sternal border. Pulses:      Carotid pulses are  on the left side with bruit. Pacemaker site is healing.  Pulmonary/Chest: Effort normal and breath sounds normal. No stridor. She has no wheezes. She has no rales.  Abdominal: Soft. Bowel sounds are normal. She exhibits no distension. There is no abdominal tenderness.  Musculoskeletal:        General: Edema (Trace bilateral) present.     Cervical back: Neck supple.  Neurological: She is alert and oriented to person, place, and time. She has intact cranial nerves (2-12).  Skin:  Skin is warm and moist.   CARDIAC DATABASE: EKG: 12/05/2021: Atrial flutter, 115 bpm, right bundle branch block 01/23/2022: Atrial flutter, 83 bpm, right bundle branch block.  02/23/2022: Sinus Bradycardia, 55bpm, RBBB, Right axis, nonspecific T-abnormality.  Direct-current cardioversion: 02/07/2022: 200 J x 1 converted to sinus bradycardia with PVCs.  Echocardiogram: 02/09/2020:  Left ventricle cavity is normal in size. Moderate concentric hypertrophy of the left ventricle. Normal global wall motion. Normal LV systolic function with visual EF 50-55%. Doppler evidence of grade II (pseudonormal) diastolic dysfunction, elevated LAP.  Left atrial cavity is mildly dilated.  Trileaflet aortic valve.   Moderate (Grade II) aortic regurgitation.  Mild to moderate mitral regurgitation.  Mild tricuspid regurgitation. Estimated pulmonary artery systolic pressure 34 mmHg.  No significant change compared tp previous study in 02/2019.   09/22/2021:  1. Left ventricular ejection fraction, by estimation, is 60 to 65%. The left ventricle has normal function. The left ventricle has no regional wall motion abnormalities. Left ventricular diastolic parameters are consistent with Grade II diastolic dysfunction (pseudonormalization).   2. Right ventricular systolic function is mildly reduced. The right ventricular size is normal. There is mildly elevated pulmonary artery systolic pressure. The estimated right ventricular systolic pressure is  Q000111Q mmHg.   3. Left atrial size was mildly dilated.   4. Right atrial size was mildly dilated.   5. The mitral valve is normal in structure. Mild to moderate mitral valve regurgitation. No evidence of mitral stenosis.   6. Tricuspid valve regurgitation is moderate.   7. The aortic valve is tricuspid. Aortic valve regurgitation is mild. No aortic stenosis is present.   8. The inferior vena cava is normal in size with <50% respiratory variability, suggesting right atrial pressure of 8 mmHg.   Stress Testing:  None  Heart Catheterization: None  Carotid duplex: 02/09/2020:  Stenosis in the right internal carotid artery (50-69%). Stenosis in the right external carotid artery (<50%).  Stenosis in the left internal carotid artery (50-69%). Stenosis in the left external carotid artery (<50%).  Antegrade right vertebral artery flow. Antegrade left vertebral artery flow.  Follow up in six months is appropriate if clinically indicated.   LABORATORY DATA: Lipid Panel     Component Value Date/Time   CHOL 110 01/15/2022 0914   TRIG 84 01/15/2022 0914   HDL 56 01/15/2022 0914   CHOLHDL 3.0 09/23/2021 0726   VLDL 25 09/23/2021 0726   LDLCALC 37 01/15/2022 0914    LDLDIRECT 40 01/15/2022 0914   LABVLDL 17 01/15/2022 0914   IMPRESSION:    ICD-10-CM   1. Atrial fibrillation and flutter (HCC)  I48.91 EKG 12-Lead   I48.92 amiodarone (PACERONE) 200 MG tablet    PCV MYOCARDIAL PERFUSION WITH LEXISCAN    2. Long term current use of antiarrhythmic drug  Z79.899 TSH    DG Chest Portable 2 Views    3. Long term (current) use of anticoagulants  Z79.01 apixaban (ELIQUIS) 2.5 MG TABS tablet    4. Atherosclerosis of both carotid arteries  I65.23 PCV MYOCARDIAL PERFUSION WITH LEXISCAN    5. Cardiac pacemaker in situ  Z95.0     6. Primary hypertension  I10     7. Chronic heart failure with preserved ejection fraction (HCC)  I50.32 PCV MYOCARDIAL PERFUSION WITH LEXISCAN    sacubitril-valsartan (ENTRESTO) 24-26 MG    8. Cerebrovascular accident (CVA) due to occlusion of left middle cerebral artery ([place])  I63.512     9. Smoking  F17.200  RECOMMENDATIONS: Mary Ortiz is a 81 y.o. 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  Atrial fibrillation and flutter (HCC) Rate control: Metoprolol. Rhythm control: Amiodarone. Thromboembolic prophylaxis: Eliquis. CHA2DS2-VASc SCORE is 7 which correlates to 6.7% risk of stroke per year (HTN, age, CVA, aortic atherosclerosis, CHF gender).  History of direct-current cardioversion 02/07/2022 200 J x 1  Long term current use of antiarrhythmic drug EKG today illustrates atrial flutter and she has history of atrial fibrillation as well. She is being treated for HFpEF as well.   Though she is not volume overloaded she also needs to be optimized. Discussed the importance of starting  antiarrhythmic to help maintain sinus rhythm.  Her options are limited given her comorbidities.  She does not want to be hospitalized for Tikosyn/sotalol loading. She is willing to start amiodarone. Patient is aware that amiodarone has multiple side effects that need to be monitored long-term which includes but not limited to eyes, thyroid function, lung function, LFTs.  Patient is also aware that amiodarone is not FDA approved for A-fib management but commonly used. We will check chest x-ray and TSH prior to initiating amiodarone. Recent CMP illustrates stable AST and ALT function. Once the labs and chest x-ray are back patient will be notified to start amiodarone. Week 1: Amiodarone 200 mg tablets x2 twice daily. Week 2: Amiodarone 200 mg p.o. twice daily  Long term (current) use of anticoagulants Has done well with Eliquis without any evidence of bleeding. However, it is becoming cost prohibitive and is requesting alternatives. For now I have provided her samples and will apply for patient assistance. Since she is greater than age of 40 and serum creatinine is greater than 1.5 mg/dL we will reduce the dose of Eliquis to 2.5 mg p.o. twice daily If the patient assistance is not approved she will need to be transitioned to Coumadin. Risks, benefits, and alternatives to anticoagulation discussed. Also discussed the medication profile with regards to Eliquis, Xarelto, Coumadin.  Atherosclerosis of both carotid arteries Noted to have bilateral carotid artery stenosis. In the past did not want to have follow-up carotid duplex to reevaluate.   However she is more willing to proceed forward regarding this.  We will have it arranged prior to next visit  Cardiac pacemaker in situ Indication: Tachybradycardia syndrome, symptomatic [employer] dual-chamber pacemaker implant, 04/12/2022, Dr. Lalla Brothers  Primary hypertension Office blood pressures are within acceptable limits. Medication changes as  discussed below  Chronic heart failure with preserved ejection fraction (HCC) Stage B, NYHA class II/III. Most recent labs reviewed-NT proBNP elevated. Discontinue olmesartan. Start Entresto. Labs in 1 week to evaluate kidney function and electrolytes. Continue Jardiance, Toprol-XL, Entresto, torsemide. Given the new onset of HFpEF and A-fib/flutter with RVR would like to rule out ischemic substrate.  We will schedule for a pharmacological stress as the patient is unable to exercise.  Cerebrovascular accident (CVA) due to occlusion of left middle cerebral artery [facility] Index event March 2023. CTA head and neck: Left M1 occlusion and bilateral carotid bulbs note atherosclerosis less than 50%.  MRI left insular cortex, BG and CR small infarcts MRA left M1 patent, bilateral proximal P1 moderate stenosis. 2D Echo EF 60 to 65% LE venous Doppler no DVT LDL 50 HgbA1c 5.3 Educated the importance of secondary  prevention.  Smoking Continues to smoke less than half a pack per day. Benefits of complete smoking cessation discussed with the patient. We will continue to monitor.  FINAL MEDICATION LIST END OF ENCOUNTER: Meds ordered this encounter  Medications   amiodarone (PACERONE) 200 MG tablet    Sig: Take 1 tablet (200 mg total) by mouth 2 (two) times daily.    Dispense:  90 tablet    Refill:  3   apixaban (ELIQUIS) 2.5 MG TABS tablet    Sig: Take 1 tablet (2.5 mg total) by mouth 2 (two) times daily.    Dispense:  180 tablet    Refill:  0   sacubitril-valsartan (ENTRESTO) 24-26 MG    Sig: Take 1 tablet by mouth 2 (two) times daily.    Dispense:  180 tablet    Refill:  3    Medications Discontinued During This Encounter  Medication Reason   apixaban (ELIQUIS) 5 MG TABS tablet Dose change   olmesartan (BENICAR) 20 MG tablet Change in therapy   sacubitril-valsartan (ENTRESTO) 24-26 MG Reorder      Current Outpatient Medications:    acetaminophen (TYLENOL) 500 MG tablet, Take 500  mg by mouth every 6 (six) hours as needed for mild pain., Disp: , Rfl:    amiodarone (PACERONE) 200 MG tablet, Take 1 tablet (200 mg total) by mouth 2 (two) times daily., Disp: 90 tablet, Rfl: 3   apixaban (ELIQUIS) 2.5 MG TABS tablet, Take 1 tablet (2.5 mg total) by mouth 2 (two) times daily., Disp: 180 tablet, Rfl: 0   atorvastatin (LIPITOR) 20 MG tablet, TAKE 1 TABLET BY MOUTH AT BEDTIME, Disp: 90 tablet, Rfl: 0   Cholecalciferol (VITAMIN D3) 50 MCG (2000 UT) TABS, Take 2,000 Units by mouth daily., Disp: , Rfl:    empagliflozin (JARDIANCE) 10 MG TABS tablet, Take 1 tablet (10 mg total) by mouth daily before breakfast., Disp: 90 tablet, Rfl: 0   meclizine (ANTIVERT) 12.5 MG tablet, Take 12.5 mg by mouth 3 (three) times daily as needed for dizziness., Disp: , Rfl:    metoprolol succinate (TOPROL XL) 25 MG 24 hr tablet, Take 1 tablet (25 mg total) by mouth in the morning and at bedtime. Hold if systolic blood pressure (top number) less than 100 mmHg.., Disp: 180 tablet, Rfl: 2   torsemide (DEMADEX) 10 MG tablet, Take 1 tablet (10 mg total) by mouth every morning., Disp: 30 tablet, Rfl: 2   sacubitril-valsartan (ENTRESTO) 24-26 MG, Take 1 tablet by mouth 2 (two) times daily., Disp: 180 tablet, Rfl: 3  Orders Placed This Encounter  Procedures   DG Chest Portable 2 Views   TSH   PCV MYOCARDIAL PERFUSION WITH LEXISCAN   EKG 12-Lead   --Continue cardiac medications as reconciled in final medication list. --Return in about 6 weeks (around 06/14/2022) for Follow up AFib/AFL, heart failure management., Review test results. Or sooner if needed. --Continue follow-up with your primary care physician regarding the management of your other chronic comorbid conditions.  Patient's questions and concerns were addressed to her satisfaction. She voices understanding of the instructions provided during this encounter.   This note was created using a voice recognition software as a result there may be grammatical  errors inadvertently enclosed that do not reflect the nature of this encounter. Every attempt is made to correct such errors.  Rex Kras, [place], [facility]  Pager: [phone] Office: [phone]

## 2022-05-03 NOTE — Patient Instructions (Addendum)
Stop olmesartan and Entresto and labs and chest x-ray in 1 week If labs and TSH are within normal limits office will contact you to start amiodarone. Week 1: 200 mg tablets x2 twice a day. Week 2: 200 mg tablets 1 tablet twice daily, until the next office visit Stress test prior to the next office visit Carotid duplex prior to the next office visit

## 2022-05-04 ENCOUNTER — Other Ambulatory Visit: Payer: Self-pay

## 2022-05-04 DIAGNOSIS — Z7901 Long term (current) use of anticoagulants: Secondary | ICD-10-CM

## 2022-05-04 MED ORDER — APIXABAN 2.5 MG PO TABS: 2.5000 mg | ORAL_TABLET | Freq: Two times a day (BID) | ORAL | 3 refills | Status: DC

## 2022-05-08 ENCOUNTER — Telehealth: Payer: Self-pay

## 2022-05-08 NOTE — Telephone Encounter (Signed)
Received the following transmission from [employer]:  Number of high ven. rate episodes (HVR) per day above limit (> 5) Last value 26 episode(s) per day reported on May 08, 2022 1:58:00 AM  Patient was a new implant on 04/12/22 and has AT/AF burden of 100% with poor ventricular rate control.  She is on Eliquis.  This has been noted at both office appointments with Marinus Maw, PA-C who increased her Toprol dosing on 10/18 and at [place] appointment with Dr. Terri Skains on 1026 who started patient on Amiodarone.    Patient has follow up with Dr. Quentin Ore in December.  Will continue to monitor for now as providers are aware.

## 2022-05-29 ENCOUNTER — Ambulatory Visit: Payer: Medicare Other

## 2022-05-29 DIAGNOSIS — I6523 Occlusion and stenosis of bilateral carotid arteries: Secondary | ICD-10-CM

## 2022-05-30 ENCOUNTER — Other Ambulatory Visit: Payer: Self-pay | Admitting: Cardiology

## 2022-05-30 DIAGNOSIS — I5031 Acute diastolic (congestive) heart failure: Secondary | ICD-10-CM

## 2022-06-04 ENCOUNTER — Other Ambulatory Visit: Payer: Medicare Other

## 2022-06-04 ENCOUNTER — Ambulatory Visit: Payer: Medicare Other

## 2022-06-04 DIAGNOSIS — I4891 Unspecified atrial fibrillation: Secondary | ICD-10-CM

## 2022-06-04 DIAGNOSIS — I5032 Chronic diastolic (congestive) heart failure: Secondary | ICD-10-CM

## 2022-06-04 DIAGNOSIS — I6523 Occlusion and stenosis of bilateral carotid arteries: Secondary | ICD-10-CM

## 2022-06-18 ENCOUNTER — Other Ambulatory Visit: Payer: Self-pay | Admitting: Cardiology

## 2022-06-18 DIAGNOSIS — I6523 Occlusion and stenosis of bilateral carotid arteries: Secondary | ICD-10-CM

## 2022-06-18 DIAGNOSIS — I63512 Cerebral infarction due to unspecified occlusion or stenosis of left middle cerebral artery: Secondary | ICD-10-CM

## 2022-06-24 ENCOUNTER — Other Ambulatory Visit: Payer: Self-pay | Admitting: Cardiology

## 2022-06-24 DIAGNOSIS — I63512 Cerebral infarction due to unspecified occlusion or stenosis of left middle cerebral artery: Secondary | ICD-10-CM

## 2022-06-24 DIAGNOSIS — I6523 Occlusion and stenosis of bilateral carotid arteries: Secondary | ICD-10-CM

## 2022-06-25 ENCOUNTER — Encounter: Payer: Self-pay | Admitting: Cardiology

## 2022-06-25 ENCOUNTER — Ambulatory Visit: Payer: Medicare Other | Admitting: Cardiology

## 2022-06-25 VITALS — BP 111/76 | HR 113 | Resp 16 | Ht 62.0 in | Wt 141.0 lb

## 2022-06-25 DIAGNOSIS — Z79899 Other long term (current) drug therapy: Secondary | ICD-10-CM

## 2022-06-25 DIAGNOSIS — I6523 Occlusion and stenosis of bilateral carotid arteries: Secondary | ICD-10-CM

## 2022-06-25 DIAGNOSIS — I63512 Cerebral infarction due to unspecified occlusion or stenosis of left middle cerebral artery: Secondary | ICD-10-CM

## 2022-06-25 DIAGNOSIS — I5032 Chronic diastolic (congestive) heart failure: Secondary | ICD-10-CM

## 2022-06-25 DIAGNOSIS — Z950 Presence of cardiac pacemaker: Secondary | ICD-10-CM

## 2022-06-25 DIAGNOSIS — I10 Essential (primary) hypertension: Secondary | ICD-10-CM

## 2022-06-25 DIAGNOSIS — I4891 Unspecified atrial fibrillation: Secondary | ICD-10-CM

## 2022-06-25 DIAGNOSIS — F17200 Nicotine dependence, unspecified, uncomplicated: Secondary | ICD-10-CM

## 2022-06-25 DIAGNOSIS — Z7901 Long term (current) use of anticoagulants: Secondary | ICD-10-CM

## 2022-06-25 MED ORDER — ATORVASTATIN CALCIUM 20 MG PO TABS: 20.0000 mg | ORAL_TABLET | Freq: Every day | ORAL | 1 refills | Status: DC

## 2022-06-25 NOTE — Progress Notes (Signed)
[name] Date of Birth: 1940-08-27 MRN: [id number] Primary Care Provider:Varadarajan, Soyla Murphy, MD Former Cardiology Providers: Dr. Florian Buff Primary Cardiologist: Tessa Lerner, DO, [facility] (established care 01/27/2020)  Date: 06/25/22 Last Office Visit: 05/03/2022  Chief Complaint  Patient presents with   Atrial Fibrillation   Congestive Heart Failure   Follow-up    6 week    HPI  Mary Ortiz is a 81 y.o.  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  In March 2023 patient was diagnosed with left MCA infarct status post IR TICI 3 likely embolic pattern.  She underwent loop recorder implant and shortly thereafter was diagnosed with A-fib.  She underwent direct-current cardioversion she converted to sinus bradycardia with frequent PVCs.  However by the time she returned back to the office she was also having symptoms consistent with HFpEF we will start on medical therapy.  Loop recorder in the meantime noted symptomatic pauses with dizziness and therefore she underwent pacemaker implant for tachybradycardia syndrome.  Based on pacemaker report she has been having episodes of tachycardia and increased A-fib/flutter burden.  Today's EKG illustrates the same.  Clinically patient states that she is feeling better compared to last office visit.  Her weight remains stable.  She denies orthopnea, PND, or lower extremity swelling.  She has been approved for patient assistance for both Entresto and Eliquis and she wants to continue anticoagulation for thromboembolic prophylaxis.  She was recommended to be on amiodarone for better rhythm control; however, after  doing her research and reading of medication profile she chooses not to be on it did not start the medication.  During her last EP appointment she was recommended to take metoprolol succinate 25 mg p.o. twice daily but she is currently taking it once a day. She has an appointment with Dr. Lalla Brothers on 07/04/2022.  Unfortunately, despite history of stroke, HFpEF, pacemaker implant, she continues to smoke regularly (7 cigarettes/day).  FUNCTIONAL STATUS: No structured exercise program or daily routine.    ALLERGIES: Allergies  Allergen Reactions   Demerol [Meperidine Hcl] Nausea Only   Penicillins Other (See Comments)    Pt states ineffective   Bacitracin-Polymyxin B Rash   Neo-Bacit-Poly-Lidocaine Rash   Tetanus-Diphtheria Toxoids Td Rash    MEDICATION LIST PRIOR TO VISIT: Current Outpatient Medications on File Prior to Visit  Medication Sig Dispense Refill   acetaminophen (TYLENOL) 500 MG tablet Take 500 mg by mouth every 6 (six) hours as needed for mild pain.     apixaban (ELIQUIS) 2.5 MG TABS tablet Take 1 tablet (2.5 mg total) by mouth 2 (two) times daily. 180 tablet 3   Cholecalciferol (VITAMIN D3) 50 MCG (2000 UT) TABS Take 2,000 Units by mouth daily.     meclizine (ANTIVERT) 12.5 MG tablet Take 12.5 mg by mouth 3 (three) times daily as needed for dizziness.     metoprolol succinate (TOPROL XL) 25 MG 24 hr tablet Take 1 tablet (25 mg total) by mouth in the morning and at bedtime. Hold if systolic blood pressure (top number) less than 100 mmHg.. 180 tablet 2   sacubitril-valsartan (ENTRESTO) 24-26 MG Take 1 tablet by mouth 2 (two) times daily. 180 tablet 3   torsemide (DEMADEX) 10 MG tablet TAKE 1 TABLET BY MOUTH ONCE DAILY IN  THE MORNING 30 tablet 0   No current facility-administered medications on file prior to visit.    PAST MEDICAL HISTORY: Past Medical History:  Diagnosis Date   Atrial fibrillation (HCC)    Carotid artery stenosis    HTN (hypertension) 01/23/2019    Hyperlipidemia    Stroke [facility])     PAST SURGICAL HISTORY: Past Surgical History:  Procedure Laterality Date   BACK SURGERY     1977   CARDIOVERSION N/A 02/07/2022   Procedure: CARDIOVERSION;  Surgeon: Tessa Lerner, DO;  Location: [facility] ENDOSCOPY;  Service: Cardiovascular;  Laterality: N/A;   CATARACT EXTRACTION, BILATERAL     2019, 2016   Hystertectomy  1983   IR CT HEAD LTD  09/22/2021   IR PERCUTANEOUS ART THROMBECTOMY/INFUSION INTRACRANIAL INC DIAG ANGIO  09/22/2021   LOOP RECORDER INSERTION N/A 09/25/2021   Procedure: LOOP RECORDER INSERTION;  Surgeon: Lanier Prude, MD;  Location: [facility] INVASIVE CV LAB;  Service: Cardiovascular;  Laterality: N/A;   LOOP RECORDER REMOVAL N/A 04/12/2022   Procedure: LOOP RECORDER REMOVAL;  Surgeon: Lanier Prude, MD;  Location: [facility] INVASIVE CV LAB;  Service: Cardiovascular;  Laterality: N/A;   PACEMAKER IMPLANT N/A 04/12/2022   Procedure: PACEMAKER IMPLANT;  Surgeon: Lanier Prude, MD;  Location: [facility] INVASIVE CV LAB;  Service: Cardiovascular;  Laterality: N/A;   RADIOLOGY WITH ANESTHESIA N/A 09/22/2021   Procedure: IR WITH ANESTHESIA;  Surgeon: Radiologist, Medication, MD;  Location: [facility] OR;  Service: Radiology;  Laterality: N/A;    FAMILY HISTORY: The patient's family history includes Heart attack in her brother, father, and mother; Kidney disease in her brother; Liver cancer in her brother; Suicidality in her sister.   SOCIAL HISTORY:  The patient  reports that she has been smoking cigarettes. She has been smoking an average of .25 packs per day. She has never used smokeless tobacco. She reports that she does not drink alcohol and does not use drugs.  Review of Systems  Cardiovascular:  Positive for dyspnea on exertion. Negative for chest pain, claudication, irregular heartbeat, leg swelling, near-syncope, orthopnea, palpitations, paroxysmal nocturnal dyspnea and syncope.  Respiratory:  Negative for shortness of breath.   Hematologic/Lymphatic:  Negative for bleeding problem.  Musculoskeletal:  Negative for muscle cramps and myalgias.  Neurological:  Negative for dizziness and light-headedness.    PHYSICAL EXAM:    06/25/2022   11:54 AM 05/03/2022   10:51 AM 04/25/2022    2:31 PM  Vitals with BMI  Height 5\' 2"  5\' 2"  5\' 2"   Weight 141 lbs 140 lbs 10 oz 141 lbs  BMI 25.78 25.71 25.78  Systolic 111 133  Diastolic 76 82 82  Pulse 113 116 118   Physical Exam  Constitutional: No distress.  Age appropriate, hemodynamically stable.   Neck: No JVD present.  Cardiovascular: S1 normal, S2 normal, intact distal pulses and normal pulses. An irregular rhythm present. Tachycardia present. Exam reveals no gallop, no S3 and no S4.  Murmur heard. Systolic murmur is present with a grade of 3/6 at the lower left sternal border. Pacemaker site is healing.  Pulmonary/Chest: Effort normal and breath sounds normal. No stridor. She has no wheezes. She has no rales.  Abdominal: Soft. Bowel sounds are normal. She exhibits no distension. There is no abdominal tenderness.  Musculoskeletal:        General: No edema.     Cervical back: Neck supple.  Neurological: She is alert and oriented to person, place, and time. She has intact cranial nerves (  2-12).  Skin: Skin is warm and moist.   CARDIAC DATABASE: EKG: 12/05/2021: Atrial flutter, 115 bpm, right bundle branch block 01/23/2022: Atrial flutter, 83 bpm, right bundle branch block.  02/23/2022: Sinus Bradycardia, 55bpm, RBBB, Right axis, nonspecific T-abnormality. 06/25/22: Atypical atrial flutter, 112 bpm, right bundle branch block, left axis, nonspecific ST-T changes likely secondary to underlying RBBB.  Direct-current cardioversion: 02/07/2022: 200 J x 1 converted to sinus bradycardia with PVCs.  Echocardiogram: 02/09/2020:  Left ventricle cavity is normal in size. Moderate concentric hypertrophy of the left ventricle. Normal global wall motion. Normal LV systolic function with visual EF  50-55%. Doppler evidence of grade II (pseudonormal) diastolic dysfunction, elevated LAP.  Left atrial cavity is mildly dilated.  Trileaflet aortic valve.  Moderate (Grade II) aortic regurgitation.  Mild to moderate mitral regurgitation.  Mild tricuspid regurgitation. Estimated pulmonary artery systolic pressure 34 mmHg.  No significant change compared tp previous study in 02/2019.   09/22/2021:  1. Left ventricular ejection fraction, by estimation, is 60 to 65%. The left ventricle has normal function. The left ventricle has no regional wall motion abnormalities. Left ventricular diastolic parameters are consistent with Grade II diastolic dysfunction (pseudonormalization).   2. Right ventricular systolic function is mildly reduced. The right ventricular size is normal. There is mildly elevated pulmonary artery systolic pressure. The estimated right ventricular systolic pressure is  42.1 mmHg.   3. Left atrial size was mildly dilated.   4. Right atrial size was mildly dilated.   5. The mitral valve is normal in structure. Mild to moderate mitral valve regurgitation. No evidence of mitral stenosis.   6. Tricuspid valve regurgitation is moderate.   7. The aortic valve is tricuspid. Aortic valve regurgitation is mild. No aortic stenosis is present.   8. The inferior vena cava is normal in size with <50% respiratory variability, suggesting right atrial pressure of 8 mmHg.   Stress Testing:  Lexiscan Tetrofosmin stress test 06/04/2022: Lexiscan/modified Bruce nuclear stress test performed using 1-day protocol. Patient achieved 2.2 METS, reached 92% MPHR. After injection the patient developed no chest pain, and experienced dyspnea. Infusion was terminated due to fatigue/weakness. Normal myocardial perfusion. TID is reported abnormal at 1.57. In absence of clear perfusion defect, this is a nonspecific finding, probably related to small resting LV cavity.  Recommend clinical correlation.   Heart  Catheterization: None  Carotid duplex: 02/09/2020:  Stenosis in the right internal carotid artery (50-69%). Stenosis in the right external carotid artery (<50%).  Stenosis in the left internal carotid artery (50-69%). Stenosis in the left external carotid artery (<50%).  Antegrade right vertebral artery flow. Antegrade left vertebral artery flow.  Follow up in six months is appropriate if clinically indicated.   05/29/2022:  Duplex suggests stenosis in the right internal carotid artery (minimal).  Hemodynamically insignificant stenosis in the right external carotid artery <50%).  Left CCA stenosis of <50% with heterogeneous plaque. Duplex suggests stenosis in the left internal carotid artery (1-15%).  Antegrade right vertebral artery flow. Antegrade left vertebral artery flow.  Compared to the study done on 04/04/2022, no significant change.  Follow-up studies if clinically indicated.   LABORATORY DATA: Lipid Panel     Component Value Date/Time   CHOL 110 01/15/2022 0914   TRIG 84 01/15/2022 0914   HDL 56 01/15/2022 0914   CHOLHDL 3.0 09/23/2021 0726   VLDL 25 09/23/2021 0726   LDLCALC 37 01/15/2022 0914   LDLDIRECT 40 01/15/2022 0914   LABVLDL 17 01/15/2022 0914   IMPRESSION:  ICD-10-CM   1. Atrial fibrillation and flutter (HCC)  I48.91 EKG 12-Lead   I48.92     2. Long term current use of antiarrhythmic drug  Z79.899     3. Long term (current) use of anticoagulants  Z79.01     4. Atherosclerosis of both carotid arteries  I65.23 PCV CAROTID DUPLEX (BILATERAL)    atorvastatin (LIPITOR) 20 MG tablet    5. Cardiac pacemaker in situ  Z95.0     6. Primary hypertension  I10     7. Chronic heart failure with preserved ejection fraction (HCC)  I50.32     8. Cerebrovascular accident (CVA) due to occlusion of left middle cerebral artery (HCC)  I63.512 atorvastatin (LIPITOR) 20 MG tablet    9. Smoking  F17.200        RECOMMENDATIONS: Mary Ortiz is a 81 y.o. 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  Atrial fibrillation and flutter (HCC) Persistent -symptomatic Rate control: Metoprolol. Rhythm control: NA. Thromboembolic prophylaxis: Eliquis. CHA2DS2-VASc SCORE is 7 which correlates to 6.7% risk of stroke per year (HTN, age, CVA, aortic atherosclerosis, CHF gender).  History of direct-current cardioversion 02/07/2022 200 J x 1 She was requested to be on Toprol-XL 25 mg p.o. twice daily but currently takes a once a day. Due to her ventricular rate still not ideal recommend increasing the Toprol-XL to twice a day.  Patient agreeable. At the last office visit she was agreeable to be on amiodarone and a prescription was provided.  However after reading the medication profile she chose not to initiate it. Other options are sotalol/Tikosyn both which will require hospitalization for initiation which she wants to avoid. She has an upcoming appointment with Dr. Lalla Brothers from EP and she will discuss her options for rhythm control at that time.  Long term (current) use of anticoagulants Indication: Atrial fibrillation/flutter  Does not endorse evidence of bleeding. Since she is approved for patient assistance she will continue Eliquis for now. Reemphasized the risks, benefits, and alternatives to anticoagulation.  She verbalizes understanding  Atherosclerosis of both carotid arteries In the past noted to have bilateral stenosis. Recent duplex notes significant improvement in overall disease burden. Will continue medical therapy. I would like to reevaluate in 1 year to reconfirm overall disease burden, repeat study ordered for December  2024.  Cardiac pacemaker in situ Indication: Tachybradycardia syndrome, symptomatic Biotron[employer] dual-chamber pacemaker implant, 04/12/2022, Dr. Lalla Brothers  Primary hypertension Office blood pressures are within acceptable limits. Medication changes as discussed below  Chronic heart failure with preserved ejection fraction (HCC) Stage B, NYHA class II/III. Did well with initiation of Entresto. Continue rest of the heart failure medications for now. Increase Toprol-XL to 25 mg p.o. twice daily as requested by EP in the past for better rate control strategy. However given her HFpEF she would benefit from rhythm control strategy as well.  She will discuss it further with Dr. Lalla Brothers at her upcoming visit on 07/04/2022 MPI performed his last office visit reported to be low risk.  She denies anginal discomfort.  Cerebrovascular accident (CVA) due to occlusion of left middle cerebral artery [facility]) Index event March 2023. CTA head and neck: Left M1 occlusion and bilateral carotid bulbs note atherosclerosis less than 50%.  MRI left insular cortex, BG and  CR small infarcts MRA left M1 patent, bilateral proximal P1 moderate stenosis. 2D Echo EF 60 to 65% MPI: Normal myocardial perfusion.  LE venous Doppler no DVT LDL 50 HgbA1c 5.3 Educated the importance of secondary prevention.  Smoking Continues to smoke approximately 7 cigarettes/day.. Benefits of complete smoking cessation discussed with the patient. We will continue to monitor.  FINAL MEDICATION LIST END OF ENCOUNTER: Meds ordered this encounter  Medications   atorvastatin (LIPITOR) 20 MG tablet    Sig: Take 1 tablet (20 mg total) by mouth at bedtime.    Dispense:  90 tablet    Refill:  1    Medications Discontinued During This Encounter  Medication Reason   amiodarone (PACERONE) 200 MG tablet    atorvastatin (LIPITOR) 20 MG tablet Reorder      Current Outpatient Medications:    acetaminophen (TYLENOL) 500 MG tablet, Take 500 mg  by mouth every 6 (six) hours as needed for mild pain., Disp: , Rfl:    apixaban (ELIQUIS) 2.5 MG TABS tablet, Take 1 tablet (2.5 mg total) by mouth 2 (two) times daily., Disp: 180 tablet, Rfl: 3   Cholecalciferol (VITAMIN D3) 50 MCG (2000 UT) TABS, Take 2,000 Units by mouth daily., Disp: , Rfl:    meclizine (ANTIVERT) 12.5 MG tablet, Take 12.5 mg by mouth 3 (three) times daily as needed for dizziness., Disp: , Rfl:    metoprolol succinate (TOPROL XL) 25 MG 24 hr tablet, Take 1 tablet (25 mg total) by mouth in the morning and at bedtime. Hold if systolic blood pressure (top number) less than 100 mmHg.., Disp: 180 tablet, Rfl: 2   sacubitril-valsartan (ENTRESTO) 24-26 MG, Take 1 tablet by mouth 2 (two) times daily., Disp: 180 tablet, Rfl: 3   torsemide (DEMADEX) 10 MG tablet, TAKE 1 TABLET BY MOUTH ONCE DAILY IN THE MORNING, Disp: 30 tablet, Rfl: 0   atorvastatin (LIPITOR) 20 MG tablet, Take 1 tablet (20 mg total) by mouth at bedtime., Disp: 90 tablet, Rfl: 1  Orders Placed This Encounter  Procedures   EKG 12-Lead   PCV CAROTID DUPLEX (BILATERAL)   --Continue cardiac medications as reconciled in final medication list. --Return in about 3 months (around 09/24/2022) for Follow up, heart failure management.. Or sooner if needed. --Continue follow-up with your primary care physician regarding the management of your other chronic comorbid conditions.  Patient's questions and concerns were addressed to her satisfaction. She voices understanding of the instructions provided during this encounter.   This note was created using a voice recognition software as a result there may be grammatical errors inadvertently enclosed that do not reflect the nature of this encounter. Every attempt is made to correct such errors.  Tessa Lerner, [place], [facility]  Pager: [phone] Office: [phone]

## 2022-07-03 ENCOUNTER — Other Ambulatory Visit: Payer: Self-pay | Admitting: Cardiology

## 2022-07-03 DIAGNOSIS — I5031 Acute diastolic (congestive) heart failure: Secondary | ICD-10-CM

## 2022-07-04 ENCOUNTER — Ambulatory Visit: Payer: Medicare Other | Admitting: Cardiology

## 2022-07-11 ENCOUNTER — Telehealth: Payer: Self-pay

## 2022-07-11 NOTE — Progress Notes (Signed)
Cardiology Office Note Date:  07/11/2022  Patient ID:  Mary, Ortiz 1940-10-21, MRN [id number] PCP:  Leeroy Cha, MD  Cardiologist:  Dr. Terri Skains Electrophysiologist: Dr. Quentin Ore     Chief Complaint:  elevated rates  History of Present Illness: Mary Ortiz is a 82 y.o. 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 She mentioned to Dr. Quentin Ore that Mary Ortiz and Mary Ortiz are significant cost burden to her.  She underwent PPM implant 04/12/22 and loop was removed as well.  I saw her 04/25/22 She is doing quite well. Can't wait to take s hower! No CP, palpitations or cardiac awareness No SOB No near syncoe or syncope. She notes since her PPM she feels so much better. No bleeding or signs of bleeding 100% AF/flutter burden, rates not well controlled and her Toprol increased to BID. Further management was deferred to Dr. Terri Skains.  She has seen Dr. Terri Skains a couple times since then,  She had not increased the Toprol at his visit and he advised she go ahead and take the Toprol BID.  He also discussed AAD options, though ultimately deferred to her visit with Dr. Quentin Ore that was scheduled for 07/04/22.  Mentioning that she had previously declined amiodarone.  07/04/22 appt was canceled  07/11/21 device clinic alert for HVR  TODAY She is doing OK No CP, palpitations or cardiac awareness She knows she is out of rhythm because she gets winded easily when in Afib. No near syncope or syncope.  She reports excellent medication compliance No missed Mary Ortiz doses. No bleeding or signs of bleeding  Device information Biotronik dual chamber PPM implanted 04/12/22  AAD Hx Multaq started Aug 2023 though too expensive and not taken  Past Medical History:   Diagnosis Date   Atrial fibrillation ([place])    Carotid artery stenosis    HTN (hypertension) 01/23/2019   Hyperlipidemia    Stroke [facility])     Past Surgical History:  Procedure Laterality Date   BACK SURGERY     1977   CARDIOVERSION N/A 02/07/2022   Procedure: CARDIOVERSION;  Surgeon: Rex Kras, DO;  Location: [place] [facility];  Service: Cardiovascular;  Laterality: N/A;   CATARACT EXTRACTION, BILATERAL     2019, 2016   Hystertectomy  1983   IR CT HEAD LTD  09/22/2021   IR PERCUTANEOUS ART THROMBECTOMY/INFUSION INTRACRANIAL INC DIAG ANGIO  09/22/2021   LOOP RECORDER INSERTION N/A 09/25/2021   Procedure: LOOP RECORDER INSERTION;  Surgeon: Vickie Epley, MD;  Location: [place] [facility];  Service: Cardiovascular;  Laterality: N/A;   LOOP RECORDER REMOVAL N/A 04/12/2022   Procedure: LOOP RECORDER REMOVAL;  Surgeon: Vickie Epley, MD;  Location: [place] [facility];  Service: Cardiovascular;  Laterality: N/A;   PACEMAKER IMPLANT N/A 04/12/2022   Procedure: PACEMAKER IMPLANT;  Surgeon: Vickie Epley, MD;  Location: [place] [facility];  Service: Cardiovascular;  Laterality: N/A;   RADIOLOGY WITH ANESTHESIA N/A 09/22/2021   Procedure: IR WITH ANESTHESIA;  Surgeon: Radiologist, Medication, MD;  Location: [place];  Service: Radiology;  Laterality: N/A;    Current Outpatient Medications  Medication Sig Dispense Refill   acetaminophen (TYLENOL) 500 MG tablet Take 500 mg by mouth every 6 (six) hours as needed for mild pain.     apixaban (Mary Ortiz) 2.5 MG  TABS tablet Take 1 tablet (2.5 mg total) by mouth 2 (two) times daily. 180 tablet 3   atorvastatin (LIPITOR) 20 MG tablet Take 1 tablet (20 mg total) by mouth at bedtime. 90 tablet 1   Cholecalciferol (VITAMIN D3) 50 MCG (2000 UT) TABS Take 2,000 Units by mouth daily.     meclizine (ANTIVERT) 12.5 MG tablet Take 12.5 mg by mouth 3 (three) times daily as needed for dizziness.     metoprolol succinate (TOPROL XL) 25 MG 24 hr tablet Take 1  tablet (25 mg total) by mouth in the morning and at bedtime. Hold if systolic blood pressure (top number) less than 100 mmHg.. 180 tablet 2   sacubitril-valsartan (ENTRESTO) 24-26 MG Take 1 tablet by mouth 2 (two) times daily. 180 tablet 3   torsemide (DEMADEX) 10 MG tablet TAKE 1 TABLET BY MOUTH ONCE DAILY IN THE MORNING 30 tablet 0   No current facility-administered medications for this visit.    Allergies:   Demerol [meperidine hcl], Penicillins, Bacitracin-polymyxin b, Neo-bacit-poly-lidocaine, and Tetanus-diphtheria toxoids td   Social History:  The patient  reports that she has been smoking cigarettes. She has been smoking an average of .25 packs per day. She has never used smokeless tobacco. She reports that she does not drink alcohol and does not use drugs.   Family History:  The patient's family history includes Heart attack in her brother, father, and mother; Kidney disease in her brother; Liver cancer in her brother; Suicidality in her sister.  ROS:  Please see the history of present illness.    All other systems are reviewed and otherwise negative.   PHYSICAL EXAM:  VS:  There were no vitals taken for this visit. BMI: There is no height or weight on file to calculate BMI. Well nourished, well developed, in no acute distress HEENT: normocephalic, atraumatic Neck: no JVD, carotid bruits or masses Cardiac: RRR; tachycardic, no significant murmurs, no rubs, or gallops Lungs: CTA b/l, no wheezing, rhonchi or rales Abd: soft, nontender MS: no deformity or atrophy Ext: no edema Skin: warm and dry, no rash Neuro:  No gross deficits appreciated Psych: euthymic mood, full affect  PPM site is stable, no erythema, edema, fluctuation or tethering   EKG:  not done today  Atypical Aflutter, 109bpm  Device interrogation done today and reviewed by myself:  Interrogated with industry support Battery and lead measurements are stable 100% AF HR histogram 90's-110s HVR episodes are  RVR, no NSVTs   09/22/21: TTE  1. Left ventricular ejection fraction, by estimation, is 60 to 65%. The  left ventricle has normal function. The left ventricle has no regional  wall motion abnormalities. Left ventricular diastolic parameters are  consistent with Grade II diastolic  dysfunction (pseudonormalization).   2. Right ventricular systolic function is mildly reduced. The right  ventricular size is normal. There is mildly elevated pulmonary artery  systolic pressure. The estimated right ventricular systolic pressure is  42.1 mmHg.   3. Left atrial size was mildly dilated.   4. Right atrial size was mildly dilated.   5. The mitral valve is normal in structure. Mild to moderate mitral valve  regurgitation. No evidence of mitral stenosis.   6. Tricuspid valve regurgitation is moderate.   7. The aortic valve is tricuspid. Aortic valve regurgitation is mild. No  aortic stenosis is present.   8. The inferior vena cava is normal in size with <50% respiratory  variability, suggesting right atrial pressure of 8 mmHg.   Recent  Labs: 01/15/2022: ALT 21 03/30/2022: Hemoglobin 12.7; Platelets 211 05/01/2022: BUN 24; Creatinine, Ser 1.61; Magnesium 2.4; NT-Pro BNP 2,495; Potassium 4.7; Sodium 138  09/23/2021: Total CHOL/HDL Ratio 3.0; VLDL 25 01/15/2022: Cholesterol, Total 110; HDL 56; LDL Chol Calc (NIH) 37; LDL Direct 40; Triglycerides 84   CrCl cannot be calculated (Patient's most recent lab result is older than the maximum 21 days allowed.).   Wt Readings from Last 3 Encounters:  06/25/22 141 lb (64 kg)  05/03/22 140 lb 9.6 oz (63.8 kg)  04/25/22 141 lb (64 kg)     Other studies reviewed: Additional studies/records reviewed today include: summarized above  ASSESSMENT AND PLAN:  PPM Intact function RV threshold reduced to 2.4V and Industry rep recommended: HAR/HVR limits reduced for better data collection   Paroxysmal Afib CHA2DS2Vasc is 7, on Mary Ortiz,  appropriately  dosed 100 % burden  We discussed options for rhythm control   DCCV, she was very weary of this, reports she had HF exacerbation after a DCCV  I don't think she is a good ablation candidate noting AFib and both typical/atypical flutters, and she would not want to pursue an invasive strategy.  AAD options With a negative stress test flecainide perhaps an option Amiodarone, she remains unagreeable 2/2 potential side effects Tikosyn, she does not want to spend 3 days in the hospital  She is anticoagulated and did try to pace terminate but was unsuccessful  Rate control is not ideal Further increase her Toprol to 50mg  BID I will reach out to Dr. Barbie Haggis to discuss perhaps Flecainide. She sees Dr. Quentin Ore in a month, will keep that as scheduled    HFpEF No symptoms or exam findings of volume OL C/w Dr. Donovan Kail  HTN Looks good, should tolerate more Toprol   Disposition: as above   Current medicines are reviewed at length with the patient today.  The patient did not have any concerns regarding medicines.  Mary Night, PA-C 07/11/2022 1:16 PM     [address]  [place] [address] [phone] (office)  [phone] (fax)

## 2022-07-11 NOTE — Telephone Encounter (Signed)
Number of high ven. rate episodes (HVR) per day above limit (> 5) Last value 8 episode(s) per day reported on Jul 11, 2022 1:58:00 AM  Patient was supposed to see Dr. Quentin Ore on 07/04/22; however, appt got cancelled.   She needs to discuss better rate control options as elevated trend continues.   Will forward to [place] (scheduler) to reach out to patient and get her rescheduled with Dr. Quentin Ore in the next 2 weeks.  Renee or Carin Hock also appropriate if no openings but keep 91 day with Quentin Ore for February.   Thanks.

## 2022-07-13 ENCOUNTER — Ambulatory Visit (INDEPENDENT_AMBULATORY_CARE_PROVIDER_SITE_OTHER): Payer: Medicare Other

## 2022-07-13 ENCOUNTER — Encounter: Payer: Self-pay | Admitting: Physician Assistant

## 2022-07-13 ENCOUNTER — Ambulatory Visit: Payer: Medicare Other | Attending: Physician Assistant | Admitting: Physician Assistant

## 2022-07-13 VITALS — BP 120/70 | HR 110 | Ht 62.0 in | Wt 141.8 lb

## 2022-07-13 DIAGNOSIS — I4892 Unspecified atrial flutter: Secondary | ICD-10-CM | POA: Diagnosis not present

## 2022-07-13 DIAGNOSIS — I484 Atypical atrial flutter: Secondary | ICD-10-CM

## 2022-07-13 DIAGNOSIS — I483 Typical atrial flutter: Secondary | ICD-10-CM | POA: Diagnosis not present

## 2022-07-13 DIAGNOSIS — I5032 Chronic diastolic (congestive) heart failure: Secondary | ICD-10-CM

## 2022-07-13 DIAGNOSIS — I63512 Cerebral infarction due to unspecified occlusion or stenosis of left middle cerebral artery: Secondary | ICD-10-CM

## 2022-07-13 DIAGNOSIS — Z950 Presence of cardiac pacemaker: Secondary | ICD-10-CM

## 2022-07-13 DIAGNOSIS — I4891 Unspecified atrial fibrillation: Secondary | ICD-10-CM | POA: Diagnosis not present

## 2022-07-13 DIAGNOSIS — I10 Essential (primary) hypertension: Secondary | ICD-10-CM

## 2022-07-13 LAB — CUP PACEART INCLINIC DEVICE CHECK
Date Time Interrogation Session: 20240105165552
Implantable Lead Connection Status: 753985
Implantable Lead Connection Status: 753985
Implantable Lead Implant Date: 20231005
Implantable Lead Implant Date: 20231005
Implantable Lead Location: 753859
Implantable Lead Location: 753860
Implantable Lead Model: 377
Implantable Lead Model: 377171
Implantable Lead Serial Number: 8000954610
Implantable Lead Serial Number: 8001083794
Implantable Pulse Generator Implant Date: 20231005
Lead Channel Pacing Threshold Amplitude: 1 V
Lead Channel Pacing Threshold Pulse Width: 0.4 ms
Lead Channel Sensing Intrinsic Amplitude: 1.4 mV
Lead Channel Sensing Intrinsic Amplitude: 16.2 mV
Pulse Gen Model: 407145
Pulse Gen Serial Number: 1000094218

## 2022-07-13 LAB — CUP PACEART REMOTE DEVICE CHECK
Battery Voltage: 100
Date Time Interrogation Session: 20240105072551
Implantable Lead Connection Status: 753985
Implantable Lead Connection Status: 753985
Implantable Lead Implant Date: 20231005
Implantable Lead Implant Date: 20231005
Implantable Lead Location: 753859
Implantable Lead Location: 753860
Implantable Lead Model: 377
Implantable Lead Model: 377171
Implantable Lead Serial Number: 8000954610
Implantable Lead Serial Number: 8001083794
Implantable Pulse Generator Implant Date: 20231005
Pulse Gen Model: 407145
Pulse Gen Serial Number: 1000094218

## 2022-07-13 MED ORDER — METOPROLOL SUCCINATE ER 50 MG PO TB24: 50.0000 mg | ORAL_TABLET | Freq: Two times a day (BID) | ORAL | 2 refills | Status: DC

## 2022-07-13 NOTE — Patient Instructions (Signed)
Medication Instructions:   START  TAKING : TOPROL 50 MG TWICE A DAY   *If you need a refill on your cardiac medications before your next appointment, please call your pharmacy*   Lab Work: NONE ORDERED  TODAY   If you have labs (blood work) drawn today and your tests are completely normal, you will receive your results only by: [place] (if you have MyChart) OR A paper copy in the mail If you have any lab test that is abnormal or we need to change your treatment, we will call you to review the results.   Testing/Procedures: NONE ORDERED  TODAY    Follow-Up: At [facility], you and your health needs are our priority.  As part of our continuing mission to provide you with exceptional heart care, we have created designated Provider Care Teams.  These Care Teams include your primary Cardiologist (physician) and Advanced Practice Providers (APPs -  Physician Assistants and Nurse Practitioners) who all work together to provide you with the care you need, when you need it.  We recommend signing up for the patient portal called "MyChart".  Sign up information is provided on this After Visit Summary.  MyChart is used to connect with patients for Virtual Visits (Telemedicine).  Patients are able to view lab/test results, encounter notes, upcoming appointments, etc.  Non-urgent messages can be sent to your provider as well.   To learn more about what you can do with MyChart, go to [email or web address].    Your next appointment:   1 month(s)  The format for your next appointment:   In Person  Provider:   Lars Mage, MD    Other Instructions   Important Information About Sugar

## 2022-07-31 NOTE — Progress Notes (Signed)
Remote pacemaker transmission.   

## 2022-08-01 ENCOUNTER — Other Ambulatory Visit: Payer: Self-pay | Admitting: Cardiology

## 2022-08-01 DIAGNOSIS — I5031 Acute diastolic (congestive) heart failure: Secondary | ICD-10-CM

## 2022-08-06 ENCOUNTER — Other Ambulatory Visit: Payer: Self-pay

## 2022-08-06 DIAGNOSIS — I5031 Acute diastolic (congestive) heart failure: Secondary | ICD-10-CM

## 2022-08-06 MED ORDER — TORSEMIDE 10 MG PO TABS: 10.0000 mg | ORAL_TABLET | Freq: Every morning | ORAL | 0 refills | Status: DC

## 2022-08-16 ENCOUNTER — Encounter (HOSPITAL_COMMUNITY): Payer: Self-pay | Admitting: *Deleted

## 2022-08-17 ENCOUNTER — Encounter: Payer: Self-pay | Admitting: Cardiology

## 2022-08-17 ENCOUNTER — Ambulatory Visit: Payer: Medicare Other | Attending: Cardiology | Admitting: Cardiology

## 2022-08-17 VITALS — BP 110/66 | HR 105 | Ht 62.0 in | Wt 138.2 lb

## 2022-08-17 DIAGNOSIS — I5031 Acute diastolic (congestive) heart failure: Secondary | ICD-10-CM

## 2022-08-17 DIAGNOSIS — I4892 Unspecified atrial flutter: Secondary | ICD-10-CM

## 2022-08-17 DIAGNOSIS — Z950 Presence of cardiac pacemaker: Secondary | ICD-10-CM

## 2022-08-17 DIAGNOSIS — I4891 Unspecified atrial fibrillation: Secondary | ICD-10-CM | POA: Diagnosis not present

## 2022-08-17 MED ORDER — METOPROLOL SUCCINATE ER 50 MG PO TB24: 75.0000 mg | ORAL_TABLET | Freq: Every day | ORAL | 3 refills | Status: DC

## 2022-08-17 MED ORDER — METOPROLOL SUCCINATE ER 50 MG PO TB24: 75.0000 mg | ORAL_TABLET | Freq: Two times a day (BID) | ORAL | 3 refills | Status: DC

## 2022-08-17 NOTE — Patient Instructions (Signed)
Medication Instructions:  Your physician has recommended you make the following change in your medication:  1) INCREASE Toprol XL (metoprolol succinate) to 75 mg twice daily  *If you need a refill on your cardiac medications before your next appointment, please call your pharmacy*  Follow-Up: At [facility], you and your health needs are our priority.  As part of our continuing mission to provide you with exceptional heart care, we have created designated Provider Care Teams.  These Care Teams include your primary Cardiologist (physician) and Advanced Practice Providers (APPs -  Physician Assistants and Nurse Practitioners) who all work together to provide you with the care you need, when you need it.  Your next appointment:   6 month(s)  Provider:   Tommye Standard, PA-C

## 2022-08-17 NOTE — Progress Notes (Signed)
Electrophysiology Office Follow up Visit Note:    Date:  08/17/2022   ID:  Mary Ortiz, DOB [birth year], MRN [id number]  PCP:  Leeroy Cha, MD  [facility] Cardiologist:  None  [facility] Electrophysiologist:  Vickie Epley, MD    Interval History:    Mary Ortiz is a 82 y.o. female who presents for a follow up visit. She has a history of bradycardia. They were last seen in clinic 03/30/2022. 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  Since her last appointment, she had a pacemaker implantation on 04/12/2022.  Today, she reports that she can feel she is out of rhythm. She endorses frequent shortness of breath.  She is compliant with her eliquis. She reports that her Metoprolol was increased from 25 mg to 50 mg BID. She is receptive to increase the dose to 75 mg BID.   She denies any chest pain, or peripheral edema. No lightheadedness, headaches, syncope, orthopnea, or PND.  Past Medical History:  Diagnosis Date   Atrial fibrillation ([place])    Carotid artery stenosis    HTN (hypertension) 01/23/2019   Hyperlipidemia    Stroke [facility])     Past Surgical History:  Procedure Laterality Date   BACK SURGERY     1977   CARDIOVERSION N/A 02/07/2022   Procedure: CARDIOVERSION;  Surgeon: Rex Kras, DO;  Location: [place] [facility];  Service: Cardiovascular;  Laterality: N/A;   CATARACT EXTRACTION, BILATERAL     2019, 2016   Hystertectomy  1983   IR CT HEAD LTD  09/22/2021   IR PERCUTANEOUS ART THROMBECTOMY/INFUSION INTRACRANIAL INC DIAG ANGIO  09/22/2021   LOOP RECORDER INSERTION N/A 09/25/2021   Procedure: LOOP RECORDER INSERTION;  Surgeon: Vickie Epley, MD;  Location: [place] [facility];  Service: Cardiovascular;  Laterality: N/A;   LOOP RECORDER REMOVAL N/A 04/12/2022   Procedure: LOOP RECORDER REMOVAL;  Surgeon: Vickie Epley, MD;  Location: [place] [facility];  Service: Cardiovascular;  Laterality: N/A;    PACEMAKER IMPLANT N/A 04/12/2022   Procedure: PACEMAKER IMPLANT;  Surgeon: Vickie Epley, MD;  Location: [place] [facility];  Service: Cardiovascular;  Laterality: N/A;   RADIOLOGY WITH ANESTHESIA N/A 09/22/2021   Procedure: IR WITH ANESTHESIA;  Surgeon: Radiologist, Medication, MD;  Location: [place];  Service: Radiology;  Laterality: N/A;    Current Medications: Current Meds  Medication Sig   acetaminophen (TYLENOL) 500 MG tablet Take 500 mg by mouth every 6 (six) hours as needed for mild pain.   apixaban (ELIQUIS) 2.5 MG TABS tablet Take 1 tablet (2.5 mg total) by mouth 2 (two) times daily.   atorvastatin (LIPITOR) 20 MG tablet Take 1 tablet (20 mg total) by mouth at bedtime.   Cholecalciferol (VITAMIN D3) 50 MCG (2000 UT) TABS Take 2,000 Units by mouth daily.   meclizine (ANTIVERT) 12.5 MG tablet Take 12.5 mg by mouth 3 (three) times daily as needed for dizziness.   sacubitril-valsartan (ENTRESTO) 24-26 MG Take 1 tablet by mouth 2 (two) times daily.   torsemide (DEMADEX) 10 MG tablet Take 1 tablet (10 mg total) by mouth every morning.   [DISCONTINUED] metoprolol succinate (TOPROL XL) 50 MG 24 hr tablet Take 1 tablet (50 mg total) by mouth in the morning and at bedtime. Hold if systolic blood pressure (top number) less than 100 mmHg..   [DISCONTINUED] metoprolol succinate (TOPROL-XL) 50 MG 24 hr tablet Take 1.5 tablets (75 mg total) by mouth daily. Take with  or immediately following a meal.     Allergies:   Demerol [meperidine hcl], Penicillins, Bacitracin-polymyxin b, Neo-bacit-poly-lidocaine, and Tetanus-diphtheria toxoids td   Social History   Socioeconomic History   Marital status: Widowed    Spouse name: Not on file   Number of children: 0   Years of education: Not on file   Highest education level: Not on file  Occupational History   Not on file  Tobacco Use   Smoking status: Every Day    Packs/day: 0.25    Types: Cigarettes   Smokeless tobacco: Never   Tobacco  comments:    smokes 3 packs a week  Vaping Use   Vaping Use: Never used  Substance and Sexual Activity   Alcohol use: Never   Drug use: Never   Sexual activity: Not on file  Other Topics Concern   Not on file  Social History Narrative   Not on file   Social Determinants of Health   Financial Resource Strain: Not on file  Food Insecurity: Not on file  Transportation Needs: Not on file  Physical Activity: Not on file  Stress: Not on file  Social Connections: Not on file     Family History: The patient's family history includes Heart attack in her brother, father, and mother; Kidney disease in her brother; Liver cancer in her brother; Suicidality in her sister.  ROS:   Please see the history of present illness.    (+) Shortness of breath (frequent) (+) Palpitations All other systems reviewed and are negative.  EKGs/Labs/Other Studies Reviewed:    The following studies were reviewed today:  08/17/2022 in clinic device interrogation personally reviewed Battery longevity 100% Lead parameters stable 100% AF burden 2% V pacing  EKG:  The EKG ordered today demonstrates AF w RVR, rates 105 bpm.  Recent Labs: 01/15/2022: ALT 21 03/30/2022: Hemoglobin 12.7; Platelets 211 05/01/2022: BUN 24; Creatinine, Ser 1.61; Magnesium 2.4; NT-Pro BNP 2,495; Potassium 4.7; Sodium 138   Recent Lipid Panel    Component Value Date/Time   CHOL 110 01/15/2022 0914   TRIG 84 01/15/2022 0914   HDL 56 01/15/2022 0914   CHOLHDL 3.0 09/23/2021 0726   VLDL 25 09/23/2021 0726   LDLCALC 37 01/15/2022 0914   LDLDIRECT 40 01/15/2022 0914    Physical Exam:    VS:  BP 110/66   Pulse (!) 105   Ht 5' 2"$  (1.575 m)   Wt 138 lb 3.2 oz (62.7 kg)   SpO2 95%   BMI 25.28 kg/m     Wt Readings from Last 3 Encounters:  08/17/22 138 lb 3.2 oz (62.7 kg)  07/13/22 141 lb 12.8 oz (64.3 kg)  06/25/22 141 lb (64 kg)     GEN: Well nourished, well developed in no acute distress CARDIAC: irregularly  irregular, tachycardic RESPIRATORY:  Clear to auscultation without rales, wheezing or rhonchi         ASSESSMENT:    1. Acute heart failure with preserved ejection fraction (HFpEF) ([place])   2. Atrial fibrillation and flutter (HCC)   3. Cardiac pacemaker in situ    PLAN:    In order of problems listed above:  #Persistent AF Declines amiodarone. Patient declines flecainide after today's conversation. She would like to avoid any new medication that has intensive monitoring or requires hospitalization/monitoring to start.  I will increase her metoprolol to 60m PO BID. She will follow up in 6 months with an APP. Continue the apixaban.  #PPM In situ Functioning appropriately. Continue  remote monitoring.  Follow up 6 months w APP.   Medication Adjustments/Labs and Tests Ordered: Current medicines are reviewed at length with the patient today.  Concerns regarding medicines are outlined above.   Orders Placed This Encounter  Procedures   EKG 12-Lead   Meds ordered this encounter  Medications   DISCONTD: metoprolol succinate (TOPROL-XL) 50 MG 24 hr tablet    Sig: Take 1.5 tablets (75 mg total) by mouth daily. Take with or immediately following a meal.    Dispense:  135 tablet    Refill:  3   metoprolol succinate (TOPROL-XL) 50 MG 24 hr tablet    Sig: Take 1.5 tablets (75 mg total) by mouth 2 (two) times daily. Take with or immediately following a meal.    Dispense:  270 tablet    Refill:  3    I,Rachel Rivera,acting as a scribe for Vickie Epley, MD.,have documented all relevant documentation on the behalf of Vickie Epley, MD,as directed by  Vickie Epley, MD while in the presence of Vickie Epley, MD.  I, Vickie Epley, MD, have reviewed all documentation for this visit. The documentation on 08/17/22 for the exam, diagnosis, procedures, and orders are all accurate and complete.   Signed, Lars Mage, MD, [facility], [facility] 08/17/2022 10:30 PM     Electrophysiology [facility]

## 2022-09-18 ENCOUNTER — Telehealth: Payer: Self-pay

## 2022-09-18 NOTE — Telephone Encounter (Signed)
Patient is asking for you to refill her Metoprolol she is almost out. Dr. Quentin Ore increased it to '75mg'$  bid. Is it ok to refill this?

## 2022-09-19 NOTE — Telephone Encounter (Signed)
Yes refill it as '75mg'$  bid.  90 days.  Update her medication list as well.   TY  Able Malloy Almen[place], DO, [facility]

## 2022-09-21 ENCOUNTER — Other Ambulatory Visit: Payer: Self-pay

## 2022-09-21 MED ORDER — METOPROLOL SUCCINATE ER 50 MG PO TB24: 75.0000 mg | ORAL_TABLET | Freq: Two times a day (BID) | ORAL | 3 refills | Status: DC

## 2022-09-21 NOTE — Telephone Encounter (Signed)
Done, and called to let patient know as well.

## 2022-09-24 ENCOUNTER — Encounter: Payer: Self-pay | Admitting: Cardiology

## 2022-09-24 ENCOUNTER — Ambulatory Visit: Payer: Medicare Other | Admitting: Cardiology

## 2022-09-24 ENCOUNTER — Other Ambulatory Visit: Payer: Self-pay | Admitting: Cardiology

## 2022-09-24 VITALS — BP 109/74 | HR 108 | Ht 62.0 in | Wt 140.0 lb

## 2022-09-24 DIAGNOSIS — IMO0001 Reserved for inherently not codable concepts without codable children: Secondary | ICD-10-CM

## 2022-09-24 DIAGNOSIS — Z7901 Long term (current) use of anticoagulants: Secondary | ICD-10-CM

## 2022-09-24 DIAGNOSIS — I4891 Unspecified atrial fibrillation: Secondary | ICD-10-CM

## 2022-09-24 DIAGNOSIS — I63512 Cerebral infarction due to unspecified occlusion or stenosis of left middle cerebral artery: Secondary | ICD-10-CM

## 2022-09-24 DIAGNOSIS — I6523 Occlusion and stenosis of bilateral carotid arteries: Secondary | ICD-10-CM

## 2022-09-24 DIAGNOSIS — I5032 Chronic diastolic (congestive) heart failure: Secondary | ICD-10-CM

## 2022-09-24 DIAGNOSIS — I10 Essential (primary) hypertension: Secondary | ICD-10-CM

## 2022-09-24 DIAGNOSIS — F17200 Nicotine dependence, unspecified, uncomplicated: Secondary | ICD-10-CM

## 2022-09-24 DIAGNOSIS — Z950 Presence of cardiac pacemaker: Secondary | ICD-10-CM

## 2022-09-24 MED ORDER — METOPROLOL SUCCINATE ER 200 MG PO TB24: 100.0000 mg | ORAL_TABLET | Freq: Every morning | ORAL | 0 refills | Status: DC

## 2022-09-24 MED ORDER — METOPROLOL SUCCINATE ER 200 MG PO TB24: 200.0000 mg | ORAL_TABLET | Freq: Every day | ORAL | 0 refills | Status: DC

## 2022-09-24 NOTE — Progress Notes (Signed)
Mary Ortiz Date of Birth: 1941-05-22 MRN: [id number] Primary Care Provider:Varadarajan, Ronie Spies, MD Former Cardiology Providers: Dr. Vear Clock Primary Cardiologist: Rex Kras, DO, [facility] (established care 01/27/2020)  Date: 09/24/22 Last Office Visit: 06/25/2022  Chief Complaint  Patient presents with   Atrial fibrillation and flutter ([place])    HPI  Mary Ortiz is a 82 y.o.  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  In March 2023 patient was diagnosed with left MCA infarct status post IR TICI 3 likely embolic pattern.  She underwent loop recorder implant and shortly thereafter was diagnosed with A-fib.  She underwent direct-current cardioversion and convertedt to sinus bradycardia with frequent PVCs.  However by the time she returned back to the office she was also having symptoms consistent with HFpEF we will start on medical therapy.  Loop recorder in the meantime noted symptomatic pauses with dizziness and therefore she underwent pacemaker implant for tachybradycardia syndrome.  With regards to the management of HFpEF we have tried uptitrating GDMT.  However, at times patient remains reluctant to increase and/or add guideline directed medical therapy.  Her weight remains relatively stable over the last few months.  She was on Entresto but ran out medications and failed to ask for refill.  She continues to follow-up with cardiac electrophysiology given her history of persistent atrial fibrillation and pacemaker in situ.  Her Toprol-XL was increased to 75 mg p.o. twice daily at the last office visit.  She has refused to be admitted for  consideration of antiarrhythmic medications to better manage her A-fib.  Patient states " I am ready to go when times comes."  Unfortunately, despite history of stroke, HFpEF, pacemaker implant, she continues to smoke regularly (6 cigarettes/day).  FUNCTIONAL STATUS: No structured exercise program or daily routine.    ALLERGIES: Allergies  Allergen Reactions   Meperidine Hcl Nausea Only   Penicillins Other (See Comments)    Pt states ineffective   Bacitracin-Polymyxin B Rash   Neo-Bacit-Poly-Lidocaine Rash   Tetanus-Diphtheria Toxoids Td Rash    MEDICATION LIST PRIOR TO VISIT: Current Outpatient Medications on File Prior to Visit  Medication Sig Dispense Refill   acetaminophen (TYLENOL) 500 MG tablet Take 500 mg by mouth every 6 (six) hours as needed for mild pain.     apixaban (ELIQUIS) 5 MG TABS tablet Take 5 mg by mouth 2 (two) times daily.     atorvastatin (LIPITOR) 20 MG tablet Take 1 tablet (20 mg total) by mouth at bedtime. 90 tablet 1   Cholecalciferol (VITAMIN D3) 50 MCG (2000 UT) TABS Take 2,000 Units by mouth daily.     torsemide (DEMADEX) 10 MG tablet Take 1 tablet (10 mg total) by mouth every morning. 30 tablet 0   No current facility-administered medications on file prior to visit.    PAST MEDICAL HISTORY: Past Medical History:  Diagnosis Date   Atrial fibrillation ([place])    Carotid artery stenosis    HTN (hypertension) 01/23/2019   Hyperlipidemia    Stroke [facility])     PAST SURGICAL HISTORY: Past Surgical History:  Procedure Laterality Date   BACK SURGERY     1977   CARDIOVERSION N/A 02/07/2022   Procedure: CARDIOVERSION;  Surgeon: Rex Kras, DO;  Location: [place];  Service: Cardiovascular;  Laterality: N/A;   CATARACT EXTRACTION, BILATERAL     2019, 2016   Hystertectomy  1983   IR CT HEAD LTD  09/22/2021   IR PERCUTANEOUS ART THROMBECTOMY/INFUSION INTRACRANIAL INC DIAG ANGIO  09/22/2021   LOOP RECORDER INSERTION N/A 09/25/2021   Procedure: LOOP  RECORDER INSERTION;  Surgeon: Vickie Epley, MD;  Location: [place] CV LAB;  Service: Cardiovascular;  Laterality: N/A;   LOOP RECORDER REMOVAL N/A 04/12/2022   Procedure: LOOP RECORDER REMOVAL;  Surgeon: Vickie Epley, MD;  Location: [place] [facility];  Service: Cardiovascular;  Laterality: N/A;   PACEMAKER IMPLANT N/A 04/12/2022   Procedure: PACEMAKER IMPLANT;  Surgeon: Vickie Epley, MD;  Location: [place] [facility];  Service: Cardiovascular;  Laterality: N/A;   RADIOLOGY WITH ANESTHESIA N/A 09/22/2021   Procedure: IR WITH ANESTHESIA;  Surgeon: Radiologist, Medication, MD;  Location: [place];  Service: Radiology;  Laterality: N/A;    FAMILY HISTORY: The patient's family history includes Heart attack in her brother, father, and mother; Kidney disease in her brother; Liver cancer in her brother; Suicidality in her sister.   SOCIAL HISTORY:  The patient  reports that she has been smoking cigarettes. She has been smoking an average of .25 packs per day. She has never used smokeless tobacco. She reports that she does not drink alcohol and does not use drugs.  Review of Systems  Constitutional: Positive for malaise/fatigue.  Cardiovascular:  Positive for dyspnea on exertion (Stable). Negative for chest pain, claudication, irregular heartbeat, leg swelling, near-syncope, orthopnea, palpitations, paroxysmal nocturnal dyspnea and syncope.  Respiratory:  Negative for shortness of breath.   Hematologic/Lymphatic: Negative for bleeding problem.  Musculoskeletal:  Negative for muscle cramps and myalgias.  Neurological:  Negative for dizziness and light-headedness.    PHYSICAL EXAM:    09/24/2022    2:30 PM 08/17/2022    3:47 PM 07/13/2022    3:12 PM  Vitals with BMI  Height 5\' 2"  5\' 2"  5\' 2"   Weight 140 lbs 138 lbs 3 oz 141 lbs 13 oz  BMI 25.6 [id number]  Systolic 0000000 [id number]  Diastolic 74 66 70  Pulse [id number] 105 110   Physical Exam  Constitutional: No distress.  Age  appropriate, hemodynamically stable.   Neck: No JVD present.  Cardiovascular: S1 normal, S2 normal, intact distal pulses and normal pulses. An irregular rhythm present. Tachycardia present. Exam reveals no gallop, no S3 and no S4.  Murmur heard. Systolic murmur is present with a grade of 3/6 at the lower left sternal border. Pacemaker site is healing.  Pulmonary/Chest: Effort normal and breath sounds normal. No stridor. She has no wheezes. She has no rales.  Abdominal: Soft. Bowel sounds are normal. She exhibits no distension. There is no abdominal tenderness.  Musculoskeletal:        General: No edema.     Cervical back: Neck supple.  Neurological: She is alert and oriented to person, place, and time. She has intact cranial nerves (2-12).  Skin: Skin is warm and moist.   CARDIAC DATABASE: EKG: 12/05/2021: Atrial flutter, 115 bpm, right bundle branch block 01/23/2022: Atrial flutter, 83 bpm, right bundle branch block.  02/23/2022: Sinus Bradycardia, 55bpm, RBBB, Right axis, nonspecific T-abnormality. 09/24/2022: Atrial fibrillation, 92 bpm, right bundle branch block, consider old inferior infarct  Direct-current cardioversion: 02/07/2022: 200 J x 1 converted to sinus bradycardia with PVCs.  Echocardiogram: 02/09/2020:  Left ventricle cavity is normal in size. Moderate concentric hypertrophy of the left ventricle. Normal global  wall motion. Normal LV systolic function with visual EF 50-55%. Doppler evidence of grade II (pseudonormal) diastolic dysfunction, elevated LAP.  Left atrial cavity is mildly dilated.  Trileaflet aortic valve.  Moderate (Grade II) aortic regurgitation.  Mild to moderate mitral regurgitation.  Mild tricuspid regurgitation. Estimated pulmonary artery systolic pressure 34 mmHg.  No significant change compared tp previous study in 02/2019.   09/22/2021:  1. Left ventricular ejection fraction, by estimation, is 60 to 65%. The left ventricle has normal function. The left  ventricle has no regional wall motion abnormalities. Left ventricular diastolic parameters are consistent with Grade II diastolic dysfunction (pseudonormalization).   2. Right ventricular systolic function is mildly reduced. The right ventricular size is normal. There is mildly elevated pulmonary artery systolic pressure. The estimated right ventricular systolic pressure is  [id number] mmHg.   3. Left atrial size was mildly dilated.   4. Right atrial size was mildly dilated.   5. The mitral valve is normal in structure. Mild to moderate mitral valve regurgitation. No evidence of mitral stenosis.   6. Tricuspid valve regurgitation is moderate.   7. The aortic valve is tricuspid. Aortic valve regurgitation is mild. No aortic stenosis is present.   8. The inferior vena cava is normal in size with <50% respiratory variability, suggesting right atrial pressure of 8 mmHg.   Stress Testing:  Lexiscan Tetrofosmin stress test 06/04/2022: Lexiscan/modified Bruce nuclear stress test performed using 1-day protocol. Patient achieved 2.2 METS, reached 92% MPHR. After injection the patient developed no chest pain, and experienced dyspnea. Infusion was terminated due to fatigue/weakness. Normal myocardial perfusion. TID is reported abnormal at 1.57. In absence of clear perfusion defect, this is a nonspecific finding, probably related to small resting LV cavity.  Recommend clinical correlation.   Heart Catheterization: None  Carotid duplex: 02/09/2020:  Stenosis in the right internal carotid artery (50-69%). Stenosis in the right external carotid artery (<50%).  Stenosis in the left internal carotid artery (50-69%). Stenosis in the left external carotid artery (<50%).  Antegrade right vertebral artery flow. Antegrade left vertebral artery flow.  Follow up in six months is appropriate if clinically indicated.   05/29/2022:  Duplex suggests stenosis in the right internal carotid artery (minimal).  Hemodynamically  insignificant stenosis in the right external carotid artery <50%).  Left CCA stenosis of <50% with heterogeneous plaque. Duplex suggests stenosis in the left internal carotid artery (1-15%).  Antegrade right vertebral artery flow. Antegrade left vertebral artery flow.  Compared to the study done on 04/04/2022, no significant change.  Follow-up studies if clinically indicated.   LABORATORY DATA:  RESULTS 05/03/2022 Care Everywhere Component Value Reference Range Notes  CBC with Diff Reviewed date:05/03/2022 01:27:42 PM Interpretation:Normal Performing Lab: Notes/Report: Testing Performed at: [facility], [address], [address], [place], [place] [address]  WBC 8.2 4.0-11.0 K/ul   RBC 4.35 4.20-5.40 M/uL   HGB 13.6 12.0-16.0 g/dL   HCT 40.5 37.0-47.0 %   MCV 93.2 81.0-99.0 fL   MCH 31.4 27.0-33.0 pg   MCHC 33.6 32.0-36.0 g/dL   RDW 14.8 11.5-15.5 %   PLT 209 150-400 K/uL   MPV 8.1 7.5-10.7 fL   NE% 65.6 43.3-71.9 %   LY% 26.8 16.8-43.5 %   MO% 5.0 4.6-12.4 %   EO% 1.8 0.0-7.8 %   BA% 0.8 0.0-1.0 %   NE# 5.4 1.9-7.2 K/uL   LY# 2.20 1.10-2.70 K/uL   MO# 0.4 0.3-0.8 K/uL   EO# 0.1 0.0-0.6 K/uL   BA# 0.1 0.0-0.1 K/uL  NRBC% 0.10    NRBC# [id number]    Comp Metabolic Panel Reviewed [id number] 01:27:29 PM Interpretation: Performing Lab: Notes/Report: Testing Performed at: [facility], [address], [address], [place], [place] [address]  Glucose 87 70-99 mg/dL   BUN 29 6-26 mg/dL   Creatinine 1.73 0.60-1.30 mg/dl   [id number] >60 calc In accordance with recommendations from NKF-ASN Task Force, [facility] has updated its eGFR calc to the 2021 CKD-EDI equation that estimates kidney function without a race variable;Stage 1 > 90 ML/Min plus Albuminuria;Stage 2 60-89 ML/MIN;Stage 3 30-59 ML/MIN;Stage 4 15-29 ML/MIN;Stage 5 <15 ML/MIN  Sodium 138 136-145 mmol/L   Potassium 4.2 3.5-5.5 mmol/L   Chloride 99 98-107 mmol/L   CO2 32 22-32 mmol/L   Anion Gap 11.6 6.0-20.0 mmol/L    Calcium 9.6 8.6-10.3 mg/dL   CA-corrected 9.30 8.60-10.30 mg/dL   Protein, Total 7.2 6.0-8.3 g/dL   Albumin 4.3 3.4-4.8 g/dL   TBIL 0.4 0.3-1.0 mg/dL   ALP 87 38-126 U/L   AST 18 0-39 U/L   ALT 21 0-52 U/L   Lipid Panel w/reflex Reviewed date:05/03/2022 01:26:48 PM Interpretation:Normal Performing Lab: Notes/Report: Testing Performed at: [facility], [address], [place], [place] [address]  Cholesterol 121 <200 mg/dL   CHOL/HDL 2.3 2.0-4.0 Ratio   HDLD 52 30-85 mg/dL Values below 40 mg/dL indicate increased risk factor  Triglyceride 115 0-199 mg/dL   NHDL 69 0-129 mg/dL Range dependent upon risk factors.  LDL Chol Calc (NIH) 49 0-99 mg/dL     View the full document.    IMPRESSION:    ICD-10-CM   1. Atrial fibrillation and flutter (HCC)  I48.91 EKG 12-Lead   I48.92 metoprolol succinate (TOPROL-XL) 200 MG 24 hr tablet    2. Long term (current) use of anticoagulants  Z79.01     3. Atherosclerosis of both carotid arteries  I65.23     4. Cardiac pacemaker in situ  Z95.0     5. Primary hypertension  I10     6. Chronic heart failure with preserved ejection fraction (HCC)  I50.32 Pro b natriuretic peptide (BNP)    Basic metabolic panel    Magnesium    metoprolol succinate (TOPROL-XL) 200 MG 24 hr tablet    7. Cerebrovascular accident (CVA) due to occlusion of left middle cerebral artery ([place])  I63.512     8. Smoking  F17.200        RECOMMENDATIONS: ROSLYNN Ortiz is a 82 y.o. 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  Atrial fibrillation and flutter (HCC) Persistent -symptomatic Rate control: Metoprolol. Rhythm control:  NA. Thromboembolic prophylaxis: Eliquis. CHA2DS2-VASc SCORE is 7 which correlates to 6.7% risk of stroke per year (HTN, age, CVA, aortic atherosclerosis, CHF gender).  History of direct-current cardioversion 02/07/2022 200 J x 1. Currently on Toprol-XL 75 mg p.o. twice daily.  Will transition her to Toprol-XL 200 mg p.o. daily. I have strongly encouraged her to consider antiarrhythmic medications to better manage her persistent atrial fibrillation.  Patient states that she will consider this again and reach out to Dr. Quentin Ore if she changes her mind. In the past has refused amiodarone due to side effect profile-understandable. Over the last 1 month the patient states that her apple iWatch has documented A-fib burden of  approximately 44% (per patient).   Long term (current) use of anticoagulants Indication: Atrial fibrillation/flutter  Does not endorse evidence of bleeding. Patient assistance for Eliquis has been approved.   Reemphasized the risks, benefits, and alternatives to anticoagulation.  She verbalizes understanding. Hemoglobin from October 2023 at baseline.  Atherosclerosis of both carotid arteries In the past noted to have bilateral stenosis. Recent duplex notes significant improvement in overall disease burden. Will continue medical therapy. I would like to reevaluate in 1 year to reconfirm overall disease burden, repeat study ordered for December 2024.  Cardiac pacemaker in situ Indication: Tachybradycardia syndrome, symptomatic Biotronik dual-chamber pacemaker implant, 04/12/2022, Dr. Quentin Ore Patient prefers follow-up with EP for her pacemaker needs.  Primary hypertension Office blood pressures are within acceptable limits. Medication changes as discussed below  Chronic heart failure with preserved ejection fraction (HCC) Stage B, NYHA class II/III. Did well with initiation of Delene Loll -will restart her prior authorization process. Increase Toprol-XL as discussed above MPI  performed his last office visit reported to be low risk.  She denies anginal discomfort.  Cerebrovascular accident (CVA) due to occlusion of left middle cerebral artery [facility]) Index event March 2023. CTA head and neck: Left M1 occlusion and bilateral carotid bulbs note atherosclerosis less than 50%.  MRI left insular cortex, BG and CR small infarcts MRA left M1 patent, bilateral proximal P1 moderate stenosis. 2D Echo EF 60 to 65% MPI: Normal myocardial perfusion.  LE venous Doppler no DVT LDL 49 HgbA1c 5.3 - last available  Educated the importance of secondary prevention.  Smoking Continues to smoke approximately 6 cigarettes/day.. Benefits of complete smoking cessation discussed with the patient. We will continue to monitor.  FINAL MEDICATION LIST END OF ENCOUNTER: Meds ordered this encounter  Medications   metoprolol succinate (TOPROL-XL) 200 MG 24 hr tablet    Sig: Take 0.5 tablets (100 mg total) by mouth every morning. Take with or immediately following a meal.    Dispense:  15 tablet    Refill:  0    Medications Discontinued During This Encounter  Medication Reason   meclizine (ANTIVERT) 12.5 MG tablet Patient Preference   sacubitril-valsartan (ENTRESTO) 24-26 MG Patient Preference   apixaban (ELIQUIS) 2.5 MG TABS tablet Dose change   metoprolol succinate (TOPROL-XL) 50 MG 24 hr tablet Dose change    Current Outpatient Medications:    acetaminophen (TYLENOL) 500 MG tablet, Take 500 mg by mouth every 6 (six) hours as needed for mild pain., Disp: , Rfl:    apixaban (ELIQUIS) 5 MG TABS tablet, Take 5 mg by mouth 2 (two) times daily., Disp: , Rfl:    atorvastatin (LIPITOR) 20 MG tablet, Take 1 tablet (20 mg total) by mouth at bedtime., Disp: 90 tablet, Rfl: 1   Cholecalciferol (VITAMIN D3) 50 MCG (2000 UT) TABS, Take 2,000 Units by mouth daily., Disp: , Rfl:    metoprolol succinate (TOPROL-XL) 200 MG 24 hr tablet, Take 0.5 tablets (100 mg total) by mouth every morning. Take  with or immediately following a meal., Disp: 15 tablet, Rfl: 0   torsemide (DEMADEX) 10 MG tablet, Take 1 tablet (10 mg total) by mouth every morning., Disp: 30 tablet, Rfl: 0  Orders Placed This Encounter  Procedures   Pro b natriuretic peptide (BNP)   Basic metabolic panel   Magnesium   EKG 12-Lead   --Continue cardiac medications as reconciled in final medication list. --Return in about 6 months (around 03/27/2023) for Follow up, heart failure management., A. fib. Or sooner if needed. --  Continue follow-up with your primary care physician regarding the management of your other chronic comorbid conditions.  Patient's questions and concerns were addressed to her satisfaction. She voices understanding of the instructions provided during this encounter.   This note was created using a voice recognition software as a result there may be grammatical errors inadvertently enclosed that do not reflect the nature of this encounter. Every attempt is made to correct such errors.  Rex Kras, [place], [facility]  Pager:  [phone] Office: [phone]

## 2022-09-25 LAB — BASIC METABOLIC PANEL
BUN/Creatinine Ratio: 18 (ref 12–28)
BUN: 37 mg/dL — ABNORMAL HIGH (ref 8–27)
CO2: 24 mmol/L (ref 20–29)
Calcium: 9.9 mg/dL (ref 8.7–10.3)
Chloride: 102 mmol/L (ref 96–106)
Creatinine, Ser: 2.11 mg/dL — ABNORMAL HIGH (ref 0.57–1.00)
Glucose: 98 mg/dL (ref 70–99)
Potassium: 4.6 mmol/L (ref 3.5–5.2)
Sodium: 142 mmol/L (ref 134–144)
eGFR: 23 mL/min/{1.73_m2} — ABNORMAL LOW (ref >59)

## 2022-09-25 LAB — MAGNESIUM: Magnesium: 2.4 mg/dL — ABNORMAL HIGH (ref 1.6–2.3)

## 2022-09-25 LAB — PRO B NATRIURETIC PEPTIDE: NT-Pro BNP: 2007 pg/mL — ABNORMAL HIGH (ref 0–738)

## 2022-09-27 ENCOUNTER — Other Ambulatory Visit: Payer: Self-pay

## 2022-09-27 DIAGNOSIS — I5032 Chronic diastolic (congestive) heart failure: Secondary | ICD-10-CM

## 2022-09-27 MED ORDER — APIXABAN 2.5 MG PO TABS: 2.5000 mg | ORAL_TABLET | Freq: Two times a day (BID) | ORAL | 3 refills | Status: DC

## 2022-09-27 NOTE — Progress Notes (Signed)
Gave patient this information. Explained torsemide use, she acknowledged understanding. Discontinued eliquis 5 mg and sent in Rx for 2.5mg . Labs also ordered for one week. She agreed to this plan and will call if any questions arise.

## 2022-10-01 ENCOUNTER — Telehealth: Payer: Self-pay

## 2022-10-01 NOTE — Telephone Encounter (Signed)
Patient call to inform you that she did not receive her entresto 24/26

## 2022-10-01 NOTE — Telephone Encounter (Signed)
Send in her script - but we were trying to get her patient assistance.   Dr. Terri Skains

## 2022-10-03 LAB — HEMOGLOBIN AND HEMATOCRIT, BLOOD
Hematocrit: 37.8 % (ref 34.0–46.6)
Hemoglobin: 12.5 g/dL (ref 11.1–15.9)

## 2022-10-03 LAB — BASIC METABOLIC PANEL
BUN/Creatinine Ratio: 12 (ref 12–28)
BUN/Creatinine Ratio: 12 (ref 12–28)
BUN: 17 mg/dL (ref 8–27)
BUN: 18 mg/dL (ref 8–27)
CO2: 22 mmol/L (ref 20–29)
CO2: 22 mmol/L (ref 20–29)
Calcium: 9.3 mg/dL (ref 8.7–10.3)
Calcium: 9.4 mg/dL (ref 8.7–10.3)
Chloride: 103 mmol/L (ref 96–106)
Chloride: 104 mmol/L (ref 96–106)
Creatinine, Ser: 1.46 mg/dL — ABNORMAL HIGH (ref 0.57–1.00)
Creatinine, Ser: 1.47 mg/dL — ABNORMAL HIGH (ref 0.57–1.00)
Glucose: 93 mg/dL (ref 70–99)
Glucose: 94 mg/dL (ref 70–99)
Potassium: 4.6 mmol/L (ref 3.5–5.2)
Potassium: 4.7 mmol/L (ref 3.5–5.2)
Sodium: 141 mmol/L (ref 134–144)
Sodium: 142 mmol/L (ref 134–144)
eGFR: 35 mL/min/{1.73_m2} — ABNORMAL LOW (ref >59)
eGFR: 35 mL/min/{1.73_m2} — ABNORMAL LOW (ref >59)

## 2022-10-03 LAB — PRO B NATRIURETIC PEPTIDE: NT-Pro BNP: 2709 pg/mL — ABNORMAL HIGH (ref 0–738)

## 2022-10-03 LAB — MAGNESIUM: Magnesium: 2.1 mg/dL (ref 1.6–2.3)

## 2022-10-04 ENCOUNTER — Other Ambulatory Visit: Payer: Self-pay

## 2022-10-04 DIAGNOSIS — I5032 Chronic diastolic (congestive) heart failure: Secondary | ICD-10-CM

## 2022-10-04 NOTE — Progress Notes (Signed)
LMTCB

## 2022-10-04 NOTE — Progress Notes (Signed)
Patient informed. 

## 2022-10-04 NOTE — Progress Notes (Signed)
Labs are already ordered and released.

## 2022-10-12 ENCOUNTER — Ambulatory Visit (INDEPENDENT_AMBULATORY_CARE_PROVIDER_SITE_OTHER): Payer: Medicare Other

## 2022-10-12 DIAGNOSIS — I080 Rheumatic disorders of both mitral and aortic valves: Secondary | ICD-10-CM | POA: Diagnosis not present

## 2022-10-12 LAB — CUP PACEART REMOTE DEVICE CHECK
Battery Voltage: 95
Date Time Interrogation Session: 20240405074051
Implantable Lead Connection Status: 753985
Implantable Lead Connection Status: 753985
Implantable Lead Implant Date: 20231005
Implantable Lead Implant Date: 20231005
Implantable Lead Location: 753859
Implantable Lead Location: 753860
Implantable Lead Model: 377
Implantable Lead Model: 377171
Implantable Lead Serial Number: 8000954610
Implantable Lead Serial Number: 8001083794
Implantable Pulse Generator Implant Date: 20231005
Pulse Gen Model: 407145
Pulse Gen Serial Number: 1000094218

## 2022-11-13 NOTE — Progress Notes (Signed)
Remote pacemaker transmission.   

## 2022-11-30 ENCOUNTER — Other Ambulatory Visit: Payer: Self-pay

## 2022-11-30 ENCOUNTER — Telehealth: Payer: Self-pay

## 2022-11-30 DIAGNOSIS — I4891 Unspecified atrial fibrillation: Secondary | ICD-10-CM

## 2022-11-30 MED ORDER — METOPROLOL SUCCINATE ER 200 MG PO TB24
200.0000 mg | ORAL_TABLET | Freq: Every day | ORAL | 1 refills | Status: DC
Start: 2022-11-30 — End: 2023-07-01

## 2022-11-30 NOTE — Telephone Encounter (Signed)
Patient called requesting a refill on Metoprolol. I have refilled it.Marland Kitchen

## 2023-01-11 ENCOUNTER — Ambulatory Visit (INDEPENDENT_AMBULATORY_CARE_PROVIDER_SITE_OTHER): Payer: Medicare Other

## 2023-01-11 DIAGNOSIS — I63512 Cerebral infarction due to unspecified occlusion or stenosis of left middle cerebral artery: Secondary | ICD-10-CM

## 2023-01-11 LAB — CUP PACEART REMOTE DEVICE CHECK
Battery Remaining Percentage: 95 %
Brady Statistic RA Percent Paced: 0 %
Brady Statistic RV Percent Paced: 3 %
Date Time Interrogation Session: 20240705091457
Implantable Lead Connection Status: 753985
Implantable Lead Connection Status: 753985
Implantable Lead Implant Date: 20231005
Implantable Lead Implant Date: 20231005
Implantable Lead Location: 753859
Implantable Lead Location: 753860
Implantable Lead Model: 377
Implantable Lead Model: 377171
Implantable Lead Serial Number: 8000954610
Implantable Lead Serial Number: 8001083794
Implantable Pulse Generator Implant Date: 20231005
Lead Channel Impedance Value: 527 Ohm
Lead Channel Impedance Value: 585 Ohm
Lead Channel Sensing Intrinsic Amplitude: 1.1 mV
Lead Channel Sensing Intrinsic Amplitude: 11.6 mV
Lead Channel Setting Pacing Amplitude: 2.4 V
Lead Channel Setting Pacing Amplitude: 3 V
Lead Channel Setting Pacing Pulse Width: 0.4 ms
Pulse Gen Model: 407145
Pulse Gen Serial Number: 1000094218

## 2023-01-28 NOTE — Progress Notes (Signed)
Remote pacemaker transmission.   

## 2023-03-29 ENCOUNTER — Ambulatory Visit: Payer: Medicare Other | Admitting: Cardiology

## 2023-04-01 ENCOUNTER — Ambulatory Visit: Payer: Medicare Other | Admitting: Cardiology

## 2023-04-08 ENCOUNTER — Other Ambulatory Visit: Payer: Self-pay | Admitting: *Deleted

## 2023-04-08 ENCOUNTER — Telehealth: Payer: Self-pay | Admitting: Pharmacy Technician

## 2023-04-08 ENCOUNTER — Other Ambulatory Visit (HOSPITAL_COMMUNITY): Payer: Self-pay

## 2023-04-08 MED ORDER — APIXABAN 2.5 MG PO TABS: 2.5000 mg | ORAL_TABLET | Freq: Two times a day (BID) | ORAL | 1 refills | Status: DC

## 2023-04-08 NOTE — Telephone Encounter (Signed)
Eliquis 2.5mg  refill request received. Patient is 82 years old, weight-63.5kg, Crea-1.46 on 10/02/22, Diagnosis-Afib, and last seen by Dr. Odis Hollingshead on 09/24/22 7 pending appt on 04/17/23. Dose is inappropriate based on dosing criteria. Will send to PharmD to evaluate eliquis doseage since creatinine levels have varied.   Crea- 09/30/22-1.46 & 1.47, 05/01/22-1.61, 04/19/22-1.70 04/17/22-1.65, 03/30/22-1.62  Last refill sent on 09/27/22 by [facility].

## 2023-04-08 NOTE — Telephone Encounter (Signed)
Ok to fill as is for 2.5mg  BID dosing since prior SCr trended > 1.5 on labs aside from the 10/02/22 labs. Recommend rechecking BMET at upcoming cards appt though to trend labs. If repeat BMET showed SCr < 1.5 then would increase her dose to 5mg  BID

## 2023-04-08 NOTE — Telephone Encounter (Signed)
Pharmacy Patient Advocate Encounter   Received notification from Fax that prior authorization for eliquis is required/requested.   Insurance verification completed.   The patient is insured through  [employer]  .   Per test claim: The current 04/08/23 day co-pay is, $47.00.  No PA needed at this time. This test claim was processed through [facility]- copay amounts may vary at other pharmacies due to pharmacy/plan contracts, or as the patient moves through the different stages of their insurance plan.

## 2023-04-12 ENCOUNTER — Ambulatory Visit: Payer: Medicare Other

## 2023-04-12 DIAGNOSIS — I4891 Unspecified atrial fibrillation: Secondary | ICD-10-CM | POA: Diagnosis not present

## 2023-04-12 DIAGNOSIS — I4892 Unspecified atrial flutter: Secondary | ICD-10-CM | POA: Diagnosis not present

## 2023-04-12 LAB — CUP PACEART REMOTE DEVICE CHECK
Battery Voltage: 90
Date Time Interrogation Session: 20241004120520
Implantable Lead Connection Status: 753985
Implantable Lead Connection Status: 753985
Implantable Lead Implant Date: 20231005
Implantable Lead Implant Date: 20231005
Implantable Lead Location: 753859
Implantable Lead Location: 753860
Implantable Lead Model: 377
Implantable Lead Model: 377171
Implantable Lead Serial Number: 8000954610
Implantable Lead Serial Number: 8001083794
Implantable Pulse Generator Implant Date: 20231005
Pulse Gen Model: 407145
Pulse Gen Serial Number: 1000094218

## 2023-04-15 ENCOUNTER — Other Ambulatory Visit: Payer: Self-pay | Admitting: Cardiology

## 2023-04-15 DIAGNOSIS — I6523 Occlusion and stenosis of bilateral carotid arteries: Secondary | ICD-10-CM

## 2023-04-15 DIAGNOSIS — I63512 Cerebral infarction due to unspecified occlusion or stenosis of left middle cerebral artery: Secondary | ICD-10-CM

## 2023-04-16 LAB — BASIC METABOLIC PANEL
BUN/Creatinine Ratio: 12 (ref 12–28)
BUN: 17 mg/dL (ref 8–27)
CO2: 25 mmol/L (ref 20–29)
Calcium: 9.4 mg/dL (ref 8.7–10.3)
Chloride: 103 mmol/L (ref 96–106)
Creatinine, Ser: 1.4 mg/dL — ABNORMAL HIGH (ref 0.57–1.00)
Glucose: 103 mg/dL — ABNORMAL HIGH (ref 70–99)
Potassium: 4.3 mmol/L (ref 3.5–5.2)
Sodium: 141 mmol/L (ref 134–144)
eGFR: 37 mL/min/{1.73_m2} — ABNORMAL LOW (ref >59)

## 2023-04-16 LAB — PRO B NATRIURETIC PEPTIDE: NT-Pro BNP: 2326 pg/mL — ABNORMAL HIGH (ref 0–738)

## 2023-04-16 LAB — MAGNESIUM: Magnesium: 1.9 mg/dL (ref 1.6–2.3)

## 2023-04-17 ENCOUNTER — Encounter: Payer: Self-pay | Admitting: Cardiology

## 2023-04-17 ENCOUNTER — Ambulatory Visit: Payer: Medicare Other | Attending: Cardiology | Admitting: Cardiology

## 2023-04-17 VITALS — BP 136/80 | HR 106 | Resp 16 | Ht 62.0 in | Wt 138.2 lb

## 2023-04-17 DIAGNOSIS — I4891 Unspecified atrial fibrillation: Secondary | ICD-10-CM

## 2023-04-17 DIAGNOSIS — I6523 Occlusion and stenosis of bilateral carotid arteries: Secondary | ICD-10-CM

## 2023-04-17 DIAGNOSIS — F17210 Nicotine dependence, cigarettes, uncomplicated: Secondary | ICD-10-CM | POA: Diagnosis not present

## 2023-04-17 DIAGNOSIS — Z950 Presence of cardiac pacemaker: Secondary | ICD-10-CM

## 2023-04-17 DIAGNOSIS — F17200 Nicotine dependence, unspecified, uncomplicated: Secondary | ICD-10-CM | POA: Diagnosis not present

## 2023-04-17 DIAGNOSIS — I10 Essential (primary) hypertension: Secondary | ICD-10-CM

## 2023-04-17 DIAGNOSIS — I5032 Chronic diastolic (congestive) heart failure: Secondary | ICD-10-CM

## 2023-04-17 DIAGNOSIS — Z79899 Other long term (current) drug therapy: Secondary | ICD-10-CM

## 2023-04-17 DIAGNOSIS — Z7901 Long term (current) use of anticoagulants: Secondary | ICD-10-CM | POA: Diagnosis not present

## 2023-04-17 DIAGNOSIS — I4892 Unspecified atrial flutter: Secondary | ICD-10-CM

## 2023-04-17 MED ORDER — LOSARTAN POTASSIUM 50 MG PO TABS: 50.0000 mg | ORAL_TABLET | Freq: Every day | ORAL | 2 refills | Status: DC

## 2023-04-17 NOTE — Progress Notes (Signed)
Cardiology Office Note:  .   Date:  04/17/2023  ID:  Mary Ortiz, DOB Jan 02, 1941, MRN [id number] PCP:  Mary Ishihara, MD  Former Cardiology Providers: Dr. Florian Ortiz  [facility] Providers Cardiologist:  Mary Lerner, DO (established care July 2021) Electrophysiologist:  Mary Prude, MD  Click to update primary MD,subspecialty MD or APP then REFRESH:1}    History of Present Illness: .   Mary Ortiz is a 82 y.o. 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   In March 2023 she was diagnosed with left MCA stroke likely embolic in pattern.  She underwent loop recorder implant shortly thereafter and was diagnosed with atrial fibrillation.  She did undergo direct-current cardioversion but shortly thereafter reverted back to atrial fibrillation.  Given the underlying atrial fibrillation she eventually developed symptoms of HFpEF was started on medical therapy.  She was noted to have prolonged symptomatic pauses with dizziness and underwent pacemaker implant for tachybradycardia syndrome.  Persistent atrial fibrillation/flutter: Patient remains in A-fib/flutter since last office visit.  Ventricular rates are better controlled compared to the past.  She does not endorse any evidence of bleeding.  In the past we have discussed consideration of antiarrhythmic medications for maintenance of sinus rhythm as well as considering consultation with A-fib clinic.  She has remained hesitant in the past.  HFpEF: Chronic and stable. She continues to have shortness of breath with day-to-day activities. Not in overt heart failure. Uptitration  of GDMT has been difficult secondary to cost. No hospitalizations for heart failure since last office encounter  Unfortunately, she continues to smoke at least 8 cigarettes/day.  Review of Systems: .   Review of Systems  Cardiovascular:  Positive for dyspnea on exertion (Stable). Negative for chest pain, claudication, irregular heartbeat, leg swelling, near-syncope, orthopnea, palpitations, paroxysmal nocturnal dyspnea and syncope.  Hematologic/Lymphatic: Negative for bleeding problem.  Musculoskeletal:  Negative for muscle cramps and myalgias.  Neurological:  Negative for dizziness and light-headedness.  Psychiatric/Behavioral:  The patient is nervous/anxious.     Studies Reviewed:   EKG: EKG Interpretation Date/Time:  Wednesday April 17 2023 14:00:19 EDT Ventricular Rate:  106 PR Interval:    QRS Duration:  132 QT Interval:  360 QTC Calculation: 478 R Axis:   -22  Text Interpretation: Atrial flutter with 2:1 A-V conduction Right bundle branch block When compared with ECG of 19-Apr-2022 14:46, Criteria for Inferior infarct are no longer Present T wave inversion more evident in Anterior leads Confirmed by Mary Ortiz ([id number]) on 04/17/2023 2:21:28 PM  Echocardiogram: August 2021: LVEF 50-55%, grade 2 diastolic dysfunction, moderate AR, see report for additional details  09/22/2021:  1. Left ventricular ejection fraction, by estimation, is 60 to 65%. The left ventricle has normal function. The left ventricle has no regional wall motion abnormalities. Left ventricular diastolic parameters are consistent with Grade II diastolic dysfunction (pseudonormalization).   2. Right ventricular systolic function is mildly reduced. The right ventricular size is normal. There is mildly elevated pulmonary artery systolic pressure. The estimated right ventricular systolic pressure is  42.1 mmHg.   3. Left atrial size was mildly dilated.   4. Right atrial size was mildly dilated.   5. The mitral  valve is normal in structure. Mild to moderate mitral valve  regurgitation. No evidence of mitral stenosis.   6. Tricuspid valve regurgitation is moderate.   7. The aortic valve is tricuspid. Aortic valve regurgitation is mild. No aortic stenosis is present.   8. The inferior vena cava is normal in size with <50% respiratory variability, suggesting right atrial pressure of 8 mmHg.    Stress Testing:  Lexiscan Tetrofosmin stress test 06/04/2022: Lexiscan/modified Bruce nuclear stress test performed using 1-day protocol. Patient achieved 2.2 METS, reached 92% MPHR. After injection the patient developed no chest pain, and experienced dyspnea. Infusion was terminated due to fatigue/weakness. Normal myocardial perfusion. TID is reported abnormal at 1.57. In absence of clear perfusion defect, this is a nonspecific finding, probably related to small resting LV cavity.  Recommend clinical correlation.    Heart Catheterization: None   Carotid duplex: 02/09/2020:  Stenosis in the right internal carotid artery (50-69%). Stenosis in the right external carotid artery (<50%).  Stenosis in the left internal carotid artery (50-69%). Stenosis in the left external carotid artery (<50%).  Antegrade right vertebral artery flow. Antegrade left vertebral artery flow.  Follow up in six months is appropriate if clinically indicated.    05/29/2022:  Duplex suggests stenosis in the right internal carotid artery (minimal).  Hemodynamically insignificant stenosis in the right external carotid artery <50%).  Left CCA stenosis of <50% with heterogeneous plaque. Duplex suggests stenosis in the left internal carotid artery (1-15%).  Antegrade right vertebral artery flow. Antegrade left vertebral artery flow.  Compared to the study done on 04/04/2022, no significant change.  Follow-up studies if clinically indicated.   RADIOLOGY: N/A  Risk Assessment/Calculations:   Click Here to Calculate/Change CHADS2VASc Score The  patient's CHADS2-VASc score is 8, indicating a 10.8% annual risk of stroke.   CHF History: Yes HTN History: Yes Diabetes History: No Stroke History: Yes Vascular Disease History: Yes  Labs:       Latest Ref Rng & Units 10/02/2022   10:05 AM 03/30/2022   12:48 PM 03/09/2022   10:10 AM  CBC  WBC 3.4 - 10.8 x10E3/uL  8.6    Hemoglobin 11.1 - 15.9 g/dL 16.1  09.6  04.5   Hematocrit 34.0 - 46.6 % 37.8  38.9  33.5   Platelets 150 - 450 x10E3/uL  211         Latest Ref Rng & Units 04/15/2023    9:54 AM 10/02/2022   10:03 AM 10/02/2022    9:55 AM  BMP  Glucose 70 - 99 mg/dL 409  93  94   BUN 8 - 27 mg/dL 17  17  18    Creatinine 0.57 - 1.00 mg/dL 8.11  9.14  7.82   BUN/Creat Ratio 12 - 28 12  12  12    Sodium 134 - 144 mmol/L 141  141  142   Potassium 3.5 - 5.2 mmol/L 4.3  4.7  4.6   Chloride 96 - 106 mmol/L 103  104  103   CO2 20 - 29 mmol/L 25  22  22    Calcium 8.7 - 10.3 mg/dL 9.4  9.3  9.4       Latest Ref Rng & Units 04/15/2023    9:54 AM 10/02/2022   10:03 AM 10/02/2022    9:55 AM  CMP  Glucose 70 - 99 mg/dL 956  93  94   BUN 8 - 27 mg/dL 17  17  18    Creatinine 0.57 - 1.00 mg/dL 2.13  0.86  5.78   Sodium 134 - 144 mmol/L 141  141  142   Potassium 3.5 - 5.2 mmol/L 4.3  4.7  4.6   Chloride 96 - 106 mmol/L 103  104  103   CO2 20 - 29 mmol/L 25  22  22    Calcium 8.7 - 10.3 mg/dL 9.4  9.3  9.4     Lab Results  Component Value Date   CHOL 110 01/15/2022   HDL 56 01/15/2022   LDLCALC 37 01/15/2022   LDLDIRECT 40 01/15/2022   TRIG 84 01/15/2022   CHOLHDL 3.0 09/23/2021   No results for input(s): "LIPOA" in the last 8760 hours. No components found for: "NTPROBNP" Recent Labs    05/01/22 1022 09/24/22 1600 10/02/22 1003 04/15/23 0954  PROBNP 2,495* 2,007* 2,709* 2,326*   No results for input(s): "TSH" in the last 8760 hours.  External Labs: Collected: October 02, 2022 [facility] database. BUN 17, creatinine 1.46. eGFR 35. Hemoglobin 12.5 g/dL.   Physical Exam:     Today's Vitals   04/17/23 1356  BP: 136/80  Pulse: (!) 106  Resp: 16  SpO2: 94%  Weight: 138 lb 3.2 oz (62.7 kg)  Height: 5\' 2"  (1.575 m)   Body mass index is 25.28 kg/m. Wt Readings from Last 3 Encounters:  04/17/23 138 lb 3.2 oz (62.7 kg)  09/24/22 140 lb (63.5 kg)  08/17/22 138 lb 3.2 oz (62.7 kg)    Physical Exam  Constitutional: No distress.  Age appropriate, hemodynamically stable.   Neck: No JVD present.  Cardiovascular: S1 normal, S2 normal, intact distal pulses and normal pulses. An irregular rhythm present. Tachycardia present. Exam reveals no gallop, no S3 and no S4.  Murmur heard. Systolic murmur is present with a grade of 3/6 at the lower left sternal border. Pacemaker site is healing.  Pulmonary/Chest: Effort normal and breath sounds normal. No stridor. She has no wheezes. She has no rales.  Abdominal: Soft. Bowel sounds are normal. She exhibits no distension. There is no abdominal tenderness.  Musculoskeletal:        General: No edema.     Cervical back: Neck supple.  Neurological: She is alert and oriented to person, place, and time. She has intact cranial nerves (2-12).  Skin: Skin is warm and moist.   Impression & Recommendation(s):  Impression:   ICD-10-CM   1. Chronic heart failure with preserved ejection fraction (HCC)  I50.32     2. Atrial fibrillation and flutter (HCC)  I48.91 EKG 12-Lead   I48.92 Amb Referral to AFIB Clinic    3. Long term (current) use of anticoagulants  Z79.01     4. Atherosclerosis of both carotid arteries  I65.23     5. Cardiac pacemaker in situ  Z95.0     6. Primary hypertension  I10 Basic metabolic panel    CANCELED: Basic metabolic panel    7. Smoking  F17.200     8. Medication management  Z79.899 Basic metabolic panel    CANCELED: Basic metabolic panel       Recommendation(s):  Chronic heart failure with preserved ejection fraction (HCC) Stage B, NYHA class II/III. Entresto in the past discontinued due to  it being cost prohibitive. Start losartan 50 mg p.o. daily. Labs in 1 week to evaluate kidney function and electrolytes. Recent labs from 04/15/2023 independently reviewed.  Renal function and NT proBNP overall at baseline. Will reattempt to optimize her heart failure medications.  Patient states that medication coverage/cost is the biggest rate limiting factor. For now we will try to figure out her  out-of-pocket cost for Entresto and Jardiance/Farxiga. She would likely will need to be followed by pharmacy to help with coverage and up titration of medical therapy.  Atrial fibrillation and flutter (HCC) Persistent atrial fibrillation/flutter.   EKG today shows rate controlled atrial flutter. Rate control: Metoprolol. Rhythm control: N/A. Thromboembolic prophylaxis: Eliquis. History of direct-current cardioversion August 2023 200 J x 1-reverted back to A-fib shortly thereafter In the past patient refused to be on amiodarone due to side effect profile We discussed initiation of flecainide-unable to exercise on treadmill post initiation (unstable gait after her stroke). Will refer her to A-fib clinic for rhythm management-likely considered for Tikosyn (please remember that cost is a big factor for the patient).  Long term (current) use of anticoagulants Currently on anticoagulation. Does not endorse evidence of bleeding. Hemoglobin remains stable. Monitor for now  Atherosclerosis of both carotid arteries Prior carotid duplex have noted carotid artery stenosis; however, follow-up study noted a significant improvement. She is scheduled for repeat carotid duplex in December 2024 to reevaluate overall disease burden.  If she does not have any significant disease we could hold off on additional surveillance duplex. Reemphasized importance of complete smoking cessation. Reemphasized the importance of lipid management  Cardiac pacemaker in situ Indication: Tachybradycardia syndrome,  symptomatic Biotronik dual-chamber pacemaker implant, 04/12/2022, Dr. Lalla Brothers Most recent pacemaker interrogation report reviewed as part of today's office visit.  Primary hypertension Office blood pressures are well-controlled. Medication changes as discussed above.  Smoking Tobacco cessation counseling: Currently smoking 8 cigarettes/day She is informed of the dangers of tobacco abuse including stroke, cancer, and MI, as well as benefits of tobacco cessation. She is not willing to quit at this time. 7 mins were spent counseling patient cessation techniques. We discussed various methods to help quit smoking, including deciding on a date to quit, joining a support group, pharmacological agents- nicotine gum/patch/lozenges.  I will reassess her progress at the next follow-up visit  Orders Placed:  Orders Placed This Encounter  Procedures   Basic metabolic panel    Standing Status:   Future    Standing Expiration Date:   04/16/2024   Amb Referral to AFIB Clinic    Referral Priority:   Routine    Referral Type:   Consultation    Number of Visits Requested:   1   EKG 12-Lead    As part of medical decision making results of the labs from October 2024, EKG, pacemaker report, were reviewed independently at today's visit.   Final Medication List:    Meds ordered this encounter  Medications   losartan (COZAAR) 50 MG tablet    Sig: Take 1 tablet (50 mg total) by mouth daily.    Dispense:  30 tablet    Refill:  2    There are no discontinued medications.   Current Outpatient Medications:    acetaminophen (TYLENOL) 500 MG tablet, Take 500 mg by mouth every 6 (six) hours as needed for mild pain., Disp: , Rfl:    apixaban (ELIQUIS) 2.5 MG TABS tablet, Take 1 tablet (2.5 mg total) by mouth 2 (two) times daily., Disp: 180 tablet, Rfl: 1   atorvastatin (LIPITOR) 20 MG tablet, TAKE 1 TABLET BY MOUTH AT BEDTIME, Disp: 90 tablet, Rfl: 0   Cholecalciferol (VITAMIN D3) 50 MCG (2000 UT) TABS,  Take 2,000 Units by mouth daily., Disp: , Rfl:    losartan (COZAAR) 50 MG tablet, Take 1 tablet (50 mg total) by mouth daily., Disp: 30 tablet, Rfl: 2   metoprolol (  TOPROL-XL) 200 MG 24 hr tablet, Take 1 tablet (200 mg total) by mouth daily. Take with or immediately following a meal., Disp: 90 tablet, Rfl: 1   torsemide (DEMADEX) 10 MG tablet, Take 1 tablet (10 mg total) by mouth every morning., Disp: 30 tablet, Rfl: 0  Consent:      N/A  Disposition:   Patient will follow-up with APP in approximately 2 months to reevaluate the results of the carotid duplex as well as up titration of GDMT based on what is approved/per prior authorization.  I will see her back in 6 months to continue longitudinal care for HFpEF and atrial fibrillation/flutter.  Her questions and concerns were addressed to her satisfaction. She voices understanding of the recommendations provided during this encounter.    Signed, Mary Lerner, DO, [facility]  [facility]  [address] [place], [place] [address] [phone] 04/17/2023 6:02 PM

## 2023-04-17 NOTE — Patient Instructions (Addendum)
Medication Instructions:  Your physician has recommended you make the following change in your medication:   1) START losartan 50 mg once daily  *If you need a refill on your cardiac medications before your next appointment, please call your pharmacy*  Lab Work: In 1 week: BMP If you have labs (blood work) drawn today and your tests are completely normal, you will receive your results only by: MyChart Message (if you have MyChart) OR A paper copy in the mail If you have any lab test that is abnormal or we need to change your treatment, we will call you to review the results.  Testing/Procedures: None ordered today.  Follow-Up: At [facility], you and your health needs are our priority.  As part of our continuing mission to provide you with exceptional heart care, we have created designated Provider Care Teams.  These Care Teams include your primary Cardiologist (physician) and Advanced Practice Providers (APPs -  Physician Assistants and Nurse Practitioners) who all work together to provide you with the care you need, when you need it.  Your next appointment:   Next available with EP APP for pacemaker F/U 2 month(s) with general cardiology APP  The format for your next appointment:   In Person  Provider:   Jari Favre, PA-C, Ronie Spies, PA-C, Robin Searing, NP, Eligha Bridegroom, NP, Tereso Newcomer, PA-C, or Perlie Gold, PA-C   Then, Tessa Lerner, DO will plan to see you again in 6 month(s).{  Other Instructions You have been referred to the A-Fib Clinic, a scheduler will call you to schedule an appointment to meet with one of their providers.

## 2023-04-18 ENCOUNTER — Other Ambulatory Visit (HOSPITAL_COMMUNITY): Payer: Self-pay

## 2023-04-19 ENCOUNTER — Encounter: Payer: Self-pay | Admitting: Pharmacist

## 2023-04-19 ENCOUNTER — Telehealth: Payer: Self-pay

## 2023-04-19 NOTE — Progress Notes (Signed)
Remote pacemaker transmission.   

## 2023-04-19 NOTE — Telephone Encounter (Signed)
Received documents via email for patient. Missing 3% rx expense report print out. Patient informed via mychart.

## 2023-04-24 ENCOUNTER — Other Ambulatory Visit (HOSPITAL_COMMUNITY): Payer: Self-pay

## 2023-04-24 ENCOUNTER — Telehealth: Payer: Self-pay | Admitting: Cardiology

## 2023-04-24 ENCOUNTER — Ambulatory Visit: Payer: Medicare Other | Attending: Cardiology

## 2023-04-24 DIAGNOSIS — I10 Essential (primary) hypertension: Secondary | ICD-10-CM

## 2023-04-24 DIAGNOSIS — Z79899 Other long term (current) drug therapy: Secondary | ICD-10-CM

## 2023-04-24 LAB — BASIC METABOLIC PANEL
BUN/Creatinine Ratio: 13 (ref 12–28)
BUN: 19 mg/dL (ref 8–27)
CO2: 22 mmol/L (ref 20–29)
Calcium: 9.6 mg/dL (ref 8.7–10.3)
Chloride: 103 mmol/L (ref 96–106)
Creatinine, Ser: 1.48 mg/dL — ABNORMAL HIGH (ref 0.57–1.00)
Glucose: 98 mg/dL (ref 70–99)
Potassium: 5 mmol/L (ref 3.5–5.2)
Sodium: 140 mmol/L (ref 134–144)
eGFR: 35 mL/min/{1.73_m2} — ABNORMAL LOW (ref >59)

## 2023-04-24 NOTE — Telephone Encounter (Signed)
Based on documents I received from patient, she does not qualify for patient assistance for Eliquis.   Based on the test claims, her copay cost for Eliquis on her insurance is $47 for 30 days and $141 for 90 days. Per rx dispense history report in Epic, her last fill of Eliquis on insurance was 09/27/22.   Are there any other more cost efficient alternatives that would be an option for this patient?

## 2023-04-24 NOTE — Telephone Encounter (Signed)
Please update her - that she needs to meet the 3% mark before assistance.  If her insurance will cover a different DOAC (less expensive) let me know.   Mary Ortiz [place], DO, [facility]

## 2023-04-24 NOTE — Telephone Encounter (Signed)
Rx expense report received via email. Working on Landscape architect

## 2023-04-24 NOTE — Telephone Encounter (Signed)
Pt's pharmacy expense summary was scanned to Haze R[place], [facility] email. FYI

## 2023-04-24 NOTE — Telephone Encounter (Signed)
Patient has dropped of a list of medications for review in order to get patient assistance with medications.

## 2023-04-25 NOTE — Telephone Encounter (Signed)
I sent Offie a message yesterday informing her that she currently was not eligible for assistance with BMS. As far as other DOAC's go, Xarelto would be the same cost for her as the Eliquis on this insurance, which would be $47. I did a test on test claim on Pradaxa as well and it is non formulary on her insurance. Currently Eliquis or Xarelto are the most cost efficient choices available to patient as both should be $47 a month.

## 2023-04-26 NOTE — Telephone Encounter (Signed)
Left voicemail to return call to office.

## 2023-04-26 NOTE — Telephone Encounter (Signed)
Thanks for the information.  Please update the patient.  If $47 is expensive than transition her to coumadin - and educate her the need for dietary restrictions and INR check.   Kiani Wurtzel [place], DO, [facility]

## 2023-05-01 NOTE — Telephone Encounter (Addendum)
Pt returned call to discuss switching from Eliquis to Warfarin. I educated pt on the need to check INR weekly until INR is consistently therapeutic as well as the need to remain consistent with greens/Vit K intake. After discussing Warfarin, I also made her aware she may have a co-pay at each weekly visit that may add up to be more costly than the $47 expense that she would have with Eliquis. Pt then seemed very confused and stated she was unaware that it would only be $47 for 30 days and was under the impression that Eliquis would be close to $500. Pt now states if Eliquis will only be $47 she definitely would like to remain on Eliquis. Will forward this back to senders to address cost and have them call pt to follow up.

## 2023-05-01 NOTE — Telephone Encounter (Signed)
Called pt to discuss transitioning to Warfarin, no answer. Left message on voicemail with direct call back number.

## 2023-05-01 NOTE — Telephone Encounter (Signed)
-----   Message -----  From: Tessa Lerner, DO  Sent: 04/26/2023  11:10 AM EDT  To: Rx Med Assistance Team   Thanks for the information.  Please update the patient.  If $47 is expensive than transition her to coumadin - and educate her the need for dietary restrictions and INR check.   Sunit Villarreal, DO, [facility]   Routing to anticoag clinic to reach out to patient and initiate coumadin.

## 2023-05-02 NOTE — Telephone Encounter (Signed)
Spoke with patient and she states she have enough eliquis to last her until next year. She was applying for patient assistance for next year. I did inform patient she will have to wait until next year to apply for patient assistance for 2025. She verbalized understanding.

## 2023-05-02 NOTE — Telephone Encounter (Signed)
So long story short - she have enough eliquis until the end of the year.  She will apply for patient assistance in 2025. - I would apply as soon as the start accepting applications don't wait for Jul 10 2023.  And she can afford $47 for 30day supply ??  Mary Ortiz [place], DO, [facility]

## 2023-05-07 ENCOUNTER — Other Ambulatory Visit: Payer: Self-pay | Admitting: Internal Medicine

## 2023-05-07 DIAGNOSIS — M85859 Other specified disorders of bone density and structure, unspecified thigh: Secondary | ICD-10-CM

## 2023-05-08 ENCOUNTER — Ambulatory Visit (HOSPITAL_COMMUNITY)
Admission: RE | Admit: 2023-05-08 | Discharge: 2023-05-08 | Disposition: A | Discharge status: Home / Self Care | Payer: Medicare Other | Source: Ambulatory Visit | Attending: Physician Assistant | Admitting: Physician Assistant

## 2023-05-08 VITALS — BP 152/84 | HR 105 | Ht 62.0 in | Wt 138.8 lb

## 2023-05-08 DIAGNOSIS — I4819 Other persistent atrial fibrillation: Secondary | ICD-10-CM | POA: Diagnosis not present

## 2023-05-08 DIAGNOSIS — I495 Sick sinus syndrome: Secondary | ICD-10-CM | POA: Diagnosis not present

## 2023-05-08 DIAGNOSIS — I484 Atypical atrial flutter: Secondary | ICD-10-CM | POA: Insufficient documentation

## 2023-05-08 DIAGNOSIS — Z8673 Personal history of transient ischemic attack (TIA), and cerebral infarction without residual deficits: Secondary | ICD-10-CM | POA: Insufficient documentation

## 2023-05-08 DIAGNOSIS — I10 Essential (primary) hypertension: Secondary | ICD-10-CM | POA: Insufficient documentation

## 2023-05-08 DIAGNOSIS — Z950 Presence of cardiac pacemaker: Secondary | ICD-10-CM | POA: Insufficient documentation

## 2023-05-08 DIAGNOSIS — Z7901 Long term (current) use of anticoagulants: Secondary | ICD-10-CM | POA: Insufficient documentation

## 2023-05-08 DIAGNOSIS — D6869 Other thrombophilia: Secondary | ICD-10-CM | POA: Insufficient documentation

## 2023-05-08 DIAGNOSIS — Z79899 Other long term (current) drug therapy: Secondary | ICD-10-CM | POA: Diagnosis not present

## 2023-05-08 NOTE — Progress Notes (Signed)
Primary Care Physician: Lorenda Ishihara, MD Primary Cardiologist: Tessa Lerner, DO Electrophysiologist: Lanier Prude, MD  Referring Physician: Dr Donzetta Matters is a 82 y.o. female with a history of CVA, HTN, HFpEF, SND s/p PPM, atrial flutter, atrial fibrillation who presents for follow up in the [facility]. Patient is on Eliquis for a CHADS2VASC score of 8.   On follow up today, Patient reports that she has been in atrial flutter for >1 year. She does not have any cardiac awareness of her arrhythmia. She states that her SOB has actually improved recently. No bleeding issues on anticoagulation.   Today, she denies symptoms of palpitations, chest pain, shortness of breath, orthopnea, PND, lower extremity edema, dizziness, presyncope, syncope, snoring, daytime somnolence, bleeding, or neurologic sequela. The patient is tolerating medications without difficulties and is otherwise without complaint today.    Atrial Fibrillation Risk Factors:  she does not have symptoms or diagnosis of sleep apnea. she does not have a history of rheumatic fever.   Atrial Fibrillation Management history:  Previous antiarrhythmic drugs: none Previous cardioversions: 02/07/22 Previous ablations: none Anticoagulation history: Eliquis  ROS- All systems are reviewed and negative except as per the HPI above.  Past Medical History:  Diagnosis Date   Atrial fibrillation (HCC)    Carotid artery stenosis    HTN (hypertension) 01/23/2019   Hyperlipidemia    Stroke [facility])     Current Outpatient Medications  Medication Sig Dispense Refill   acetaminophen (TYLENOL) 500 MG tablet Take 500 mg by mouth every 6 (six) hours as needed for mild pain.     apixaban (ELIQUIS) 2.5 MG TABS tablet Take 1 tablet (2.5 mg total) by mouth 2 (two) times daily. 180 tablet 1   atorvastatin (LIPITOR) 20 MG tablet TAKE 1 TABLET BY MOUTH AT BEDTIME 90 tablet 0   Cholecalciferol  (VITAMIN D3) 50 MCG (2000 UT) TABS Take 2,000 Units by mouth daily.     losartan (COZAAR) 50 MG tablet Take 1 tablet (50 mg total) by mouth daily. 30 tablet 2   meclizine (ANTIVERT) 12.5 MG tablet As needed for dizziness     metoprolol (TOPROL-XL) 200 MG 24 hr tablet Take 1 tablet (200 mg total) by mouth daily. Take with or immediately following a meal. 90 tablet 1   torsemide (DEMADEX) 10 MG tablet Take 1 tablet (10 mg total) by mouth every morning. 30 tablet 0   No current facility-administered medications for this encounter.    Physical Exam: BP (!) 152/84   Pulse (!) 105   Ht 5\' 2"  (1.575 m)   Wt 63 kg   BMI 25.39 kg/m   GEN: Well nourished, well developed in no acute distress NECK: No JVD; No carotid bruits CARDIAC: Regular rate and rhythm, no murmurs, rubs, gallops RESPIRATORY:  Clear to auscultation without rales, wheezing or rhonchi  ABDOMEN: Soft, non-tender, non-distended EXTREMITIES:  No edema; No deformity   Wt Readings from Last 3 Encounters:  05/08/23 63 kg  04/17/23 62.7 kg  09/24/22 63.5 kg     EKG today demonstrates  Atrial flutter with 2:1 block, RBBB Vent. rate 105 BPM PR interval * ms QRS duration 126 ms QT/QTcB 346/457 ms  Echo 09/22/21 demonstrated   1. Left ventricular ejection fraction, by estimation, is 60 to 65%. The  left ventricle has normal function. The left ventricle has no regional  wall motion abnormalities. Left ventricular diastolic parameters are  consistent with Grade II diastolic  dysfunction (pseudonormalization).   2. Right ventricular systolic function is mildly reduced. The right  ventricular size is normal. There is mildly elevated pulmonary artery  systolic pressure. The estimated right ventricular systolic pressure is  42.1 mmHg.   3. Left atrial size was mildly dilated.   4. Right atrial size was mildly dilated.   5. The mitral valve is normal in structure. Mild to moderate mitral valve  regurgitation. No evidence of mitral  stenosis.   6. Tricuspid valve regurgitation is moderate.   7. The aortic valve is tricuspid. Aortic valve regurgitation is mild. No  aortic stenosis is present.   8. The inferior vena cava is normal in size with <50% respiratory  variability, suggesting right atrial pressure of 8 mmHg.     CHA2DS2-VASc Score = 8  The patient's score is based upon: CHF History: 1 HTN History: 1 Diabetes History: 0 Stroke History: 2 Vascular Disease History: 1 Age Score: 2 Gender Score: 1       ASSESSMENT AND PLAN: Persistent Atrial Fibrillation/atrial flutter The patient's CHA2DS2-VASc score is 8, indicating a 10.8% annual risk of stroke.   Patient remains in atrial flutter, asymptomatic. We discussed rhythm control options including flecainide, dofetilide, amiodarone. She is not interested in starting a new medication. Will pursue a rate control strategy.  Continue Eliquis 2.5 mg BID (Cr borderline) Continue Toprol 200 mg daily  Secondary Hypercoagulable State (ICD10:  D68.69) The patient is at significant risk for stroke/thromboembolism based upon her CHA2DS2-VASc Score of 8.  Continue Apixaban (Eliquis).   Sinus node dysfunction S/p PPM, followed by Dr Lalla Brothers and the device clinic.  HTN Stable on current regimen    Follow up with Francis Dowse and Jari Favre as scheduled.        Jorja Loa PA-C [facility] [address], [place] [address] [phone]

## 2023-05-16 NOTE — Progress Notes (Deleted)
Cardiology Office Note Date:  05/16/2023  Patient ID:  Mary, Ortiz Dec 03, 1940, MRN [id number] PCP:  Lorenda Ishihara, MD  Cardiologist:  Dr. Odis Hollingshead Electrophysiologist: Dr. Lalla Brothers     Chief Complaint:  *** 6 mo  History of Present Illness: Mary Ortiz is a 82 y.o. 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 She mentioned to Dr. Lalla Brothers that Mary Ortiz and Mary Ortiz are significant cost burden to her. She underwent PPM implant 04/12/22 and loop was removed as well.  -------------------------------------------  I saw her 07/13/22 She is doing OK No CP, palpitations or cardiac awareness She knows she is out of rhythm because she gets winded easily when in Afib. No near syncope or syncope. She reports excellent medication compliance No missed Mary Ortiz doses. No bleeding or signs of bleeding She was in AFlutter, 100% burden, attempted to pace terminate, unable Did not think ablation candidate given AFib, typical/atypical flutters She was weary of AADs, did not want DCCV  She saw Dr. Lalla Brothers 08/17/22, declined AAD options discussed, metoprolol further titrated upwards  She has seen Dr. Odis Hollingshead a few times, last was 04/17/23: some baseline SOB, still smoking, cost burden making GDMT difficult, planned to meet with pharmacy team Referred to AFib clinic for rhythm management discussion, consideration for Tikosyn, again mentioning cost a limitation.  She saw the AFib clinic 05/08/23: pt was not interested in pursuing rhythm control/AAD medications Planned for rate control strategy going forward  *** rates ok? *** device only *** Mary Ortiz, dose, bleeding, labs   Device information Biotronik dual chamber PPM implanted 04/12/22  AAD Hx Multaq started Aug 2023  though too expensive and not taken At patient preference >>> rate control strategy alone Oct 2024   Past Medical History:  Diagnosis Date   Atrial fibrillation [facility])    Carotid artery stenosis    HTN (hypertension) 01/23/2019   Hyperlipidemia    Stroke [facility])     Past Surgical History:  Procedure Laterality Date   BACK SURGERY     1977   CARDIOVERSION N/A 02/07/2022   Procedure: CARDIOVERSION;  Surgeon: Tessa Lerner, DO;  Location: [facility] ENDOSCOPY;  Service: Cardiovascular;  Laterality: N/A;   CATARACT EXTRACTION, BILATERAL     2019, 2016   Hystertectomy  1983   IR CT HEAD LTD  09/22/2021   IR PERCUTANEOUS ART THROMBECTOMY/INFUSION INTRACRANIAL INC DIAG ANGIO  09/22/2021   LOOP RECORDER INSERTION N/A 09/25/2021   Procedure: LOOP RECORDER INSERTION;  Surgeon: Lanier Prude, MD;  Location: [facility] INVASIVE CV LAB;  Service: Cardiovascular;  Laterality: N/A;   LOOP RECORDER REMOVAL N/A 04/12/2022   Procedure: LOOP RECORDER REMOVAL;  Surgeon: Lanier Prude, MD;  Location: [facility] INVASIVE CV LAB;  Service: Cardiovascular;  Laterality: N/A;   PACEMAKER IMPLANT N/A 04/12/2022   Procedure: PACEMAKER IMPLANT;  Surgeon: Lanier Prude, MD;  Location: [facility] INVASIVE CV LAB;  Service: Cardiovascular;  Laterality: N/A;   RADIOLOGY WITH ANESTHESIA N/A 09/22/2021   Procedure: IR WITH ANESTHESIA;  Surgeon: Radiologist, Medication, MD;  Location: [facility] OR;  Service: Radiology;  Laterality: N/A;    Current Outpatient Medications  Medication Sig Dispense Refill   acetaminophen (TYLENOL) 500 MG tablet Take 500 mg by mouth every 6 (six) hours as needed for mild pain.     apixaban (Mary Ortiz) 2.5  MG TABS tablet Take 1 tablet (2.5 mg total) by mouth 2 (two) times daily. 180 tablet 1   atorvastatin (LIPITOR) 20 MG tablet TAKE 1 TABLET BY MOUTH AT BEDTIME 90 tablet 0   Cholecalciferol (VITAMIN D3) 50 MCG (2000 UT) TABS Take 2,000 Units by mouth daily.     losartan (COZAAR) 50 MG tablet Take 1 tablet (50 mg total) by  mouth daily. 30 tablet 2   meclizine (ANTIVERT) 12.5 MG tablet As needed for dizziness     metoprolol (TOPROL-XL) 200 MG 24 hr tablet Take 1 tablet (200 mg total) by mouth daily. Take with or immediately following a meal. 90 tablet 1   torsemide (DEMADEX) 10 MG tablet Take 1 tablet (10 mg total) by mouth every morning. 30 tablet 0   No current facility-administered medications for this visit.    Allergies:   Meperidine hcl, Penicillins, Bacitracin-polymyxin b, Neo-bacit-poly-lidocaine, and Tetanus-diphtheria toxoids td   Social History:  The patient  reports that she has been smoking cigarettes. She has never used smokeless tobacco. She reports that she does not drink alcohol and does not use drugs.   Family History:  The patient's family history includes Heart attack in her brother, father, and mother; Kidney disease in her brother; Liver cancer in her brother; Suicidality in her sister.  ROS:  Please see the history of present illness.    All other systems are reviewed and otherwise negative.   PHYSICAL EXAM:  VS:  There were no vitals taken for this visit. BMI: There is no height or weight on file to calculate BMI. Well nourished, well developed, in no acute distress HEENT: normocephalic, atraumatic Neck: no JVD, carotid bruits or masses Cardiac: RRR; tachycardic, no significant murmurs, no rubs, or gallops Lungs: CTA b/l, no wheezing, rhonchi or rales Abd: soft, nontender MS: no deformity or atrophy Ext: no edema Skin: warm and dry, no rash Neuro:  No gross deficits appreciated Psych: euthymic mood, full affect  PPM site is stable, no erythema, edema, fluctuation or tethering   EKG:  not done today  Atypical Aflutter, 109bpm  Device interrogation done today and reviewed by myself:  Interrogated with industry support Battery and lead measurements are stable 100% AF HR histogram 90's-110s HVR episodes are RVR, no NSVTs   09/22/21: TTE  1. Left ventricular ejection  fraction, by estimation, is 60 to 65%. The  left ventricle has normal function. The left ventricle has no regional  wall motion abnormalities. Left ventricular diastolic parameters are  consistent with Grade II diastolic  dysfunction (pseudonormalization).   2. Right ventricular systolic function is mildly reduced. The right  ventricular size is normal. There is mildly elevated pulmonary artery  systolic pressure. The estimated right ventricular systolic pressure is  42.1 mmHg.   3. Left atrial size was mildly dilated.   4. Right atrial size was mildly dilated.   5. The mitral valve is normal in structure. Mild to moderate mitral valve  regurgitation. No evidence of mitral stenosis.   6. Tricuspid valve regurgitation is moderate.   7. The aortic valve is tricuspid. Aortic valve regurgitation is mild. No  aortic stenosis is present.   8. The inferior vena cava is normal in size with <50% respiratory  variability, suggesting right atrial pressure of 8 mmHg.   Recent Labs: 10/02/2022: Hemoglobin 12.5 04/15/2023: Magnesium 1.9; NT-Pro BNP 2,326 04/24/2023: BUN 19; Creatinine, Ser 1.48; Potassium 5.0; Sodium 140  No results found for requested labs within last 365 days.  CrCl cannot be calculated (Patient's most recent lab result is older than the maximum 21 days allowed.).   Wt Readings from Last 3 Encounters:  05/08/23 138 lb 12.8 oz (63 kg)  04/17/23 138 lb 3.2 oz (62.7 kg)  09/24/22 140 lb (63.5 kg)     Other studies reviewed: Additional studies/records reviewed today include: summarized above  ASSESSMENT AND PLAN:  PPM *** Intact function *** no programming changes made   Paroxysmal >>> permanent Afib Atypical and typical AFlutters described CHA2DS2Vasc is 7, on Mary Ortiz, *** appropriately dosed *** % burden   HFpEF *** No symptoms or exam findings of volume OL C/w Dr. Page Spiro  HTN ***   Disposition: ***   Current medicines are reviewed at length with the  patient today.  The patient did not have any concerns regarding medicines.  Norma Fredrickson, PA-C 05/16/2023 1:07 PM     [facility] [address] [place] [address] [phone] (office)  [phone] (fax)

## 2023-05-17 ENCOUNTER — Ambulatory Visit: Payer: Medicare Other | Admitting: Physician Assistant

## 2023-06-12 ENCOUNTER — Ambulatory Visit (HOSPITAL_COMMUNITY)
Admission: RE | Admit: 2023-06-12 | Discharge: 2023-06-12 | Disposition: A | Discharge status: Home / Self Care | Payer: Medicare Other | Source: Ambulatory Visit | Attending: Cardiovascular Disease | Admitting: Cardiovascular Disease

## 2023-06-12 ENCOUNTER — Other Ambulatory Visit: Payer: Medicare Other

## 2023-06-12 DIAGNOSIS — I6523 Occlusion and stenosis of bilateral carotid arteries: Secondary | ICD-10-CM

## 2023-06-17 ENCOUNTER — Ambulatory Visit: Payer: Medicare Other | Attending: Physician Assistant | Admitting: Physician Assistant

## 2023-06-17 ENCOUNTER — Encounter: Payer: Self-pay | Admitting: Physician Assistant

## 2023-06-17 VITALS — BP 138/72 | HR 109 | Ht 62.0 in | Wt 142.6 lb

## 2023-06-17 DIAGNOSIS — I4891 Unspecified atrial fibrillation: Secondary | ICD-10-CM

## 2023-06-17 DIAGNOSIS — I484 Atypical atrial flutter: Secondary | ICD-10-CM

## 2023-06-17 DIAGNOSIS — Z950 Presence of cardiac pacemaker: Secondary | ICD-10-CM | POA: Diagnosis not present

## 2023-06-17 DIAGNOSIS — I4892 Unspecified atrial flutter: Secondary | ICD-10-CM

## 2023-06-17 DIAGNOSIS — Z7901 Long term (current) use of anticoagulants: Secondary | ICD-10-CM

## 2023-06-17 DIAGNOSIS — I10 Essential (primary) hypertension: Secondary | ICD-10-CM | POA: Diagnosis not present

## 2023-06-17 DIAGNOSIS — I5032 Chronic diastolic (congestive) heart failure: Secondary | ICD-10-CM

## 2023-06-17 DIAGNOSIS — I4819 Other persistent atrial fibrillation: Secondary | ICD-10-CM

## 2023-06-17 DIAGNOSIS — D6869 Other thrombophilia: Secondary | ICD-10-CM

## 2023-06-17 DIAGNOSIS — Z79899 Other long term (current) drug therapy: Secondary | ICD-10-CM

## 2023-06-17 NOTE — Progress Notes (Signed)
Cardiology Office Note:  .   Date:  06/17/2023  ID:  Mary Ortiz, DOB 03-Dec-1940, MRN [id number] PCP: Lorenda Ishihara, MD  [facility] Providers Cardiologist:  Yadhira Mckneely Lerner, DO Electrophysiologist:  Lanier Prude, MD { History of Present Illness: .   Mary Ortiz is a 82 y.o. 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  March 2023 was diagnosed with left MCA stroke likely embolic in pattern.  Underwent loop recorder implantation shortly after and was diagnosed with atrial fibrillation.  She did undergo direct current cardioversion but shortly thereafter reverted back to atrial fibrillation.  Given the underlying A-fib she eventually developed symptoms of HFpEF and was started on medical therapy.  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  She was seen by Dr. Jovita Gamma 04/17/2023 and she remained in atrial fibs/a flutter since the last office visit.  Ventricular rates are better controlled versus the past.  Did not endorse any evidence of bleeding.  In the past, discussed considering antiarrhythmic medication for maintenance of sinus rhythm as well as considering consultation to the atrial fibrillation clinic.  Remained hesitant in the past.  Her HFpEF is chronic and stable.  She continues to have shortness of breath with day-to-day activities.  No overt heart failure.  Uptitrated GDMT however has been difficult due to cost.  No hospitalizations for heart failure since last office encounter.  Unfortunately, she continues to smoke at least 8 cigarettes a day and cessation was  advised.  Today, she presents with a history of atrial fibrillation (AFib), heart failure, and kidney disease, is on Eliquis and metoprolol for AFib management, and losartan for heart failure. She has been in AFib consistently, with heart rates around 106-109 bpm. She has been seen at the AFib clinic, where no changes were made to her medication regimen. She reports no new issues with fluid retention or worsening shortness of breath unless she is in AFib, which she monitors at home with an Apple watch. She has not experienced significant bleeding on Eliquis. She has not seen a nephrologist, but her kidney function is borderline, with a creatinine of 1.48. She has applied for assistance with medication costs but was denied. She has been receiving Eliquis for free but was denied for the upcoming year. She smokes approximately one pack of cigarettes every two to three days. She recently had an ultrasound of her neck arteries, which was near normal. She has a pacemaker, which is still tender at the site.  Reports no shortness of breath nor dyspnea on exertion. Reports no chest pain, pressure, or tightness. No edema, orthopnea, PND. Reports no palpitations.   Discussed the use of AI scribe software for clinical note transcription with the patient, who gave verbal consent to proceed.  ROS: Pertinent ROS in HPI  Studies Reviewed: .        Echocardiogram: August 2021: LVEF 50-55%, grade 2 diastolic dysfunction, moderate AR, see report for additional details   09/22/2021:  1. Left ventricular ejection fraction, by estimation, is 60 to 65%. The left ventricle has normal function. The left ventricle has no regional wall motion abnormalities. Left ventricular diastolic parameters are consistent with Grade II diastolic dysfunction (pseudonormalization).   2. Right ventricular systolic  function is mildly reduced. The right ventricular size is normal. There is mildly elevated pulmonary artery systolic pressure.  The estimated right ventricular systolic pressure is  42.1 mmHg.   3. Left atrial size was mildly dilated.   4. Right atrial size was mildly dilated.   5. The mitral valve is normal in structure. Mild to moderate mitral valve regurgitation. No evidence of mitral stenosis.   6. Tricuspid valve regurgitation is moderate.   7. The aortic valve is tricuspid. Aortic valve regurgitation is mild. No aortic stenosis is present.   8. The inferior vena cava is normal in size with <50% respiratory variability, suggesting right atrial pressure of 8 mmHg.    Stress Testing:  Lexiscan Tetrofosmin stress test 06/04/2022: Lexiscan/modified Bruce nuclear stress test performed using 1-day protocol. Patient achieved 2.2 METS, reached 92% MPHR. After injection the patient developed no chest pain, and experienced dyspnea. Infusion was terminated due to fatigue/weakness. Normal myocardial perfusion. TID is reported abnormal at 1.57. In absence of clear perfusion defect, this is a nonspecific finding, probably related to small resting LV cavity.  Recommend clinical correlation.    Heart Catheterization: None   Carotid duplex: 02/09/2020:  Stenosis in the right internal carotid artery (50-69%). Stenosis in the right external carotid artery (<50%).  Stenosis in the left internal carotid artery (50-69%). Stenosis in the left external carotid artery (<50%).  Antegrade right vertebral artery flow. Antegrade left vertebral artery flow.  Follow up in six months is appropriate if clinically indicated.    05/29/2022:  Duplex suggests stenosis in the right internal carotid artery (minimal).  Hemodynamically insignificant stenosis in the right external carotid artery <50%).  Left CCA stenosis of <50% with heterogeneous plaque. Duplex suggests stenosis in the left internal carotid artery (1-15%).  Antegrade right vertebral artery flow. Antegrade left vertebral artery flow.  Compared to the study done on 04/04/2022, no  significant change.  Follow-up studies if clinically indicated.       Physical Exam:   VS:  BP 138/72   Pulse (!) 109   Ht 5\' 2"  (1.575 m)   Wt 142 lb 9.6 oz (64.7 kg)   SpO2 95%   BMI 26.08 kg/m    Wt Readings from Last 3 Encounters:  06/17/23 142 lb 9.6 oz (64.7 kg)  05/08/23 138 lb 12.8 oz (63 kg)  04/17/23 138 lb 3.2 oz (62.7 kg)    GEN: Well nourished, well developed in no acute distress NECK: No JVD; No carotid bruits CARDIAC: IRIR, no murmurs, rubs, gallops RESPIRATORY:  Clear to auscultation without rales, wheezing or rhonchi  ABDOMEN: Soft, non-tender, non-distended EXTREMITIES:  No edema; No deformity   ASSESSMENT AND PLAN: .    Atrial Fibrillation Chronic, rate-controlled with Metoprolol. No new symptoms. Eliquis for stroke prevention. -Continue current management with Metoprolol and Eliquis. -Monitor heart rate with Apple watch and report if rates exceed 120 bpm.  Medication Cost Patient has difficulty affording Eliquis and potential addition of Jardiance due to insurance limitations. -Explore coupons for Eliquis and Jardiance. -Consider consultation with a pharmacist to discuss cost-effective alternatives.  Chronic HfpEF Stable, on Losartan. Discussed potential switch to [facility] for improved heart failure management, but patient prefers to stay on Losartan due to cost and insurance coverage. -Continue Losartan.  Smoking Patient smokes approximately one pack of cigarettes every 2-3 days. No current plans to quit. -Encourage smoking cessation for overall health improvement.  Carotid Artery Disease Recent ultrasound showed near normal results. -Continue monitoring as needed.  Pacemaker No  current issues reported. -Continue current management.  Hyperlipidemia Last LDL was 49 in October of the previous year. Patient is tolerating Lipitor well. -Plan for lipid panel during primary care visit in April.       Dispo: Follow-up with Dr. Odis Hollingshead in 6  months  Signed, Sharlene Dory, PA-C

## 2023-06-17 NOTE — Patient Instructions (Signed)
Medication Instructions:  Your physician recommends that you continue on your current medications as directed. Please refer to the Current Medication list given to you today.  *If you need a refill on your cardiac medications before your next appointment, please call your pharmacy*   Lab Work: None ordered  If you have labs (blood work) drawn today and your tests are completely normal, you will receive your results only by: MyChart Message (if you have MyChart) OR A paper copy in the mail If you have any lab test that is abnormal or we need to change your treatment, we will call you to review the results.   Testing/Procedures: None ordered   Follow-Up: At [facility], you and your health needs are our priority.  As part of our continuing mission to provide you with exceptional heart care, we have created designated Provider Care Teams.  These Care Teams include your primary Cardiologist (physician) and Advanced Practice Providers (APPs -  Physician Assistants and Nurse Practitioners) who all work together to provide you with the care you need, when you need it.  We recommend signing up for the patient portal called "MyChart".  Sign up information is provided on this After Visit Summary.  MyChart is used to connect with patients for Virtual Visits (Telemedicine).  Patients are able to view lab/test results, encounter notes, upcoming appointments, etc.  Non-urgent messages can be sent to your provider as well.   To learn more about what you can do with MyChart, go to [email or web address].    Your next appointment:   4 month(s)  Provider:   Tessa Lerner, DO     Other Instructions

## 2023-06-29 ENCOUNTER — Other Ambulatory Visit: Payer: Self-pay | Admitting: Cardiology

## 2023-06-29 DIAGNOSIS — I4891 Unspecified atrial fibrillation: Secondary | ICD-10-CM

## 2023-07-12 ENCOUNTER — Ambulatory Visit (INDEPENDENT_AMBULATORY_CARE_PROVIDER_SITE_OTHER): Payer: Medicare Other

## 2023-07-12 DIAGNOSIS — I63512 Cerebral infarction due to unspecified occlusion or stenosis of left middle cerebral artery: Secondary | ICD-10-CM | POA: Diagnosis not present

## 2023-07-12 LAB — CUP PACEART REMOTE DEVICE CHECK
Battery Voltage: 90
Date Time Interrogation Session: 20250103074537
Implantable Lead Connection Status: 753985
Implantable Lead Connection Status: 753985
Implantable Lead Implant Date: 20231005
Implantable Lead Implant Date: 20231005
Implantable Lead Location: 753859
Implantable Lead Location: 753860
Implantable Lead Model: 377
Implantable Lead Model: 377171
Implantable Lead Serial Number: 8000954610
Implantable Lead Serial Number: 8001083794
Implantable Pulse Generator Implant Date: 20231005
Pulse Gen Model: 407145
Pulse Gen Serial Number: 1000094218

## 2023-07-23 ENCOUNTER — Other Ambulatory Visit: Payer: Self-pay | Admitting: Cardiology

## 2023-07-23 DIAGNOSIS — I6523 Occlusion and stenosis of bilateral carotid arteries: Secondary | ICD-10-CM

## 2023-07-23 DIAGNOSIS — I63512 Cerebral infarction due to unspecified occlusion or stenosis of left middle cerebral artery: Secondary | ICD-10-CM

## 2023-08-08 ENCOUNTER — Other Ambulatory Visit: Payer: Self-pay | Admitting: Cardiology

## 2023-08-16 NOTE — Progress Notes (Signed)
 Remote pacemaker transmission.

## 2023-08-20 ENCOUNTER — Other Ambulatory Visit (HOSPITAL_COMMUNITY): Payer: Self-pay

## 2023-10-11 ENCOUNTER — Ambulatory Visit (INDEPENDENT_AMBULATORY_CARE_PROVIDER_SITE_OTHER): Payer: Medicare Other

## 2023-10-11 DIAGNOSIS — I63512 Cerebral infarction due to unspecified occlusion or stenosis of left middle cerebral artery: Secondary | ICD-10-CM | POA: Diagnosis not present

## 2023-10-11 LAB — CUP PACEART REMOTE DEVICE CHECK
Battery Voltage: 90
Date Time Interrogation Session: 20250404081159
Implantable Lead Connection Status: 753985
Implantable Lead Connection Status: 753985
Implantable Lead Implant Date: 20231005
Implantable Lead Implant Date: 20231005
Implantable Lead Location: 753859
Implantable Lead Location: 753860
Implantable Lead Model: 377
Implantable Lead Model: 377171
Implantable Lead Serial Number: 8000954610
Implantable Lead Serial Number: 8001083794
Implantable Pulse Generator Implant Date: 20231005
Pulse Gen Model: 407145
Pulse Gen Serial Number: 1000094218

## 2023-10-14 ENCOUNTER — Encounter: Payer: Self-pay | Admitting: Cardiology

## 2023-10-21 ENCOUNTER — Ambulatory Visit: Payer: Medicare Other | Attending: Cardiology | Admitting: Cardiology

## 2023-10-21 ENCOUNTER — Encounter: Payer: Self-pay | Admitting: Cardiology

## 2023-10-21 VITALS — BP 120/82 | HR 80 | Resp 16 | Ht 62.0 in | Wt 137.6 lb

## 2023-10-21 DIAGNOSIS — I10 Essential (primary) hypertension: Secondary | ICD-10-CM

## 2023-10-21 DIAGNOSIS — F17210 Nicotine dependence, cigarettes, uncomplicated: Secondary | ICD-10-CM | POA: Diagnosis not present

## 2023-10-21 DIAGNOSIS — D6869 Other thrombophilia: Secondary | ICD-10-CM | POA: Diagnosis not present

## 2023-10-21 DIAGNOSIS — I4819 Other persistent atrial fibrillation: Secondary | ICD-10-CM

## 2023-10-21 DIAGNOSIS — I5032 Chronic diastolic (congestive) heart failure: Secondary | ICD-10-CM

## 2023-10-21 DIAGNOSIS — Z7901 Long term (current) use of anticoagulants: Secondary | ICD-10-CM | POA: Diagnosis not present

## 2023-10-21 DIAGNOSIS — Z950 Presence of cardiac pacemaker: Secondary | ICD-10-CM

## 2023-10-21 DIAGNOSIS — I63512 Cerebral infarction due to unspecified occlusion or stenosis of left middle cerebral artery: Secondary | ICD-10-CM | POA: Diagnosis not present

## 2023-10-21 NOTE — Progress Notes (Signed)
 Cardiology Office Note:  .   Date:  10/21/2023  ID:  Mary Ortiz, DOB 08-Jan-1941, MRN [id number] PCP:  Arva Lathe, MD  Former Cardiology Providers: Dr. Scot Cutter  [facility] Providers Cardiologist:  Olinda Bertrand, DO (established care July 2021) Electrophysiologist:  Boyce Byes, MD  Click to update primary MD,subspecialty MD or APP then REFRESH:1}    Chief Complaint  Patient presents with   Chronic heart failure with preserved ejection fraction    Atrial fibrillation and flutter   Follow-up   History of Present Illness: .   Mary Ortiz is a 83 y.o. 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   In March 2023 she was diagnosed with left MCA stroke likely embolic in pattern.  She underwent loop recorder implant shortly thereafter and was diagnosed with atrial fibrillation.  She did undergo direct-current cardioversion but shortly thereafter reverted back to atrial fibrillation.  Given the underlying atrial fibrillation she eventually developed symptoms of HFpEF was started on medical therapy.  She was noted to have prolonged symptomatic pauses with dizziness and underwent pacemaker implant for tachybradycardia syndrome.  Persistent atrial fibrillation/flutter: During the last office visit because she remained in persistent A-fib she was requested for consideration of antiarrhythmic medications to maintain sinus rhythm and/or consult with A-fib clinic.  However patient remained hesitant and wanted to continue current medical therapy as she remained asymptomatic. Today she is sinus rhythm but her iWatch notes her burden  to to at least 19% ( use to be close to 80% in the past).   HFpEF. Likely precipitated by her underlying atrial fibrillation.  When focusing on up titration of medical therapy but has been difficult due to son being cost prohibitive.  Entresto was discontinued and Jardiance was not started for the same reason.  At the last office visit was started on losartan 50 mg p.o. daily.  Unfortunately, she continues to smoke at least 8 cigarettes/day  Review of Systems: .   Review of Systems  Cardiovascular:  Negative for chest pain, claudication, irregular heartbeat, leg swelling, near-syncope, orthopnea, palpitations, paroxysmal nocturnal dyspnea and syncope.  Respiratory:  Negative for shortness of breath.   Hematologic/Lymphatic: Negative for bleeding problem.    Studies Reviewed:   EKG: EKG Interpretation Date/Time:  Monday October 21 2023 13:19:42 EDT Ventricular Rate:  82 PR Interval:  262 QRS Duration:  134 QT Interval:  418 QTC Calculation: 488 R Axis:   61  Text Interpretation: Sinus rhythm with 1st degree A-V block Right bundle branch block When compared with ECG of 08-May-2023 15:39, Sinus rhythm has replaced Atrial flutter ST no longer depressed in Inferior leads Confirmed by Olinda Bertrand [phone]) on 10/21/2023 1:25:32 PM  Echocardiogram: August 2021: LVEF 50-55%, grade 2 diastolic dysfunction, moderate AR, see report for additional details  09/22/2021:  1. Left ventricular ejection fraction, by estimation, is 60 to 65%. The left ventricle has normal function. The left ventricle has no regional wall motion abnormalities. Left ventricular diastolic parameters are consistent with Grade II diastolic dysfunction (pseudonormalization).   2. Right ventricular systolic function is mildly reduced. The right ventricular size is normal. There is mildly elevated pulmonary artery systolic pressure. The estimated right ventricular systolic pressure is  42.1 mmHg.   3.  Left atrial size was mildly  dilated.   4. Right atrial size was mildly dilated.   5. The mitral valve is normal in structure. Mild to moderate mitral valve regurgitation. No evidence of mitral stenosis.   6. Tricuspid valve regurgitation is moderate.   7. The aortic valve is tricuspid. Aortic valve regurgitation is mild. No aortic stenosis is present.   8. The inferior vena cava is normal in size with <50% respiratory variability, suggesting right atrial pressure of 8 mmHg.    Stress Testing:  Lexiscan Tetrofosmin stress test 06/04/2022: Lexiscan/modified Bruce nuclear stress test performed using 1-day protocol. Patient achieved 2.2 METS, reached 92% MPHR. After injection the patient developed no chest pain, and experienced dyspnea. Infusion was terminated due to fatigue/weakness. Normal myocardial perfusion. TID is reported abnormal at 1.57. In absence of clear perfusion defect, this is a nonspecific finding, probably related to small resting LV cavity.  Recommend clinical correlation.    Heart Catheterization: None   Carotid duplex: 02/09/2020: Bilateral ICA stenosis 50-69%.  See report for additional details   05/29/2022:  Duplex suggests stenosis in the right internal carotid artery (minimal).  Hemodynamically insignificant stenosis in the right external carotid artery <50%).  See report for additional details  06/2023: Right Carotid: The extracranial vessels were near-normal with only minimal  wall thickening or plaque.   Left Carotid: The extracranial vessels were near-normal with only minimal wall thickening or plaque.   Vertebrals:  Bilateral vertebral arteries demonstrate antegrade flow.  Subclavians: Normal flow hemodynamics were seen in bilateral subclavian arteries.   Suggest follow up study PRN.    RADIOLOGY: N/A  Risk Assessment/Calculations:   Click Here to Calculate/Change CHADS2VASc Score  The patient's CHADS2-VASc score is 8, indicating a 10.8% annual risk of stroke.   CHF History:  Yes HTN History: Yes Diabetes History: No Stroke History: Yes Vascular Disease History: Yes  Labs:       Latest Ref Rng & Units 10/02/2022   10:05 AM 03/30/2022   12:48 PM 03/09/2022   10:10 AM  CBC  WBC 3.4 - 10.8 x10E3/uL  8.6    Hemoglobin 11.1 - 15.9 g/dL 54.0  98.1  19.1   Hematocrit 34.0 - 46.6 % 37.8  38.9  33.5   Platelets 150 - 450 x10E3/uL  211         Latest Ref Rng & Units 04/24/2023   10:27 AM 04/15/2023    9:54 AM 10/02/2022   10:03 AM  BMP  Glucose 70 - 99 mg/dL 98  478  93   BUN 8 - 27 mg/dL 19  17  17    Creatinine 0.57 - 1.00 mg/dL 2.95  6.21  3.08   BUN/Creat Ratio 12 - 28 13  12  12    Sodium 134 - 144 mmol/L 140  141  141   Potassium 3.5 - 5.2 mmol/L 5.0  4.3  4.7   Chloride 96 - 106 mmol/L 103  103  104   CO2 20 - 29 mmol/L 22  25  22    Calcium 8.7 - 10.3 mg/dL 9.6  9.4  9.3       Latest Ref Rng & Units 04/24/2023   10:27 AM 04/15/2023    9:54 AM 10/02/2022   10:03 AM  CMP  Glucose 70 - 99 mg/dL 98  657  93   BUN 8 - 27 mg/dL 19  17  17    Creatinine 0.57 - 1.00 mg/dL 8.46  9.62  9.52   Sodium 134 -  144 mmol/L 140  141  141   Potassium 3.5 - 5.2 mmol/L 5.0  4.3  4.7   Chloride 96 - 106 mmol/L 103  103  104   CO2 20 - 29 mmol/L 22  25  22    Calcium 8.7 - 10.3 mg/dL 9.6  9.4  9.3     Lab Results  Component Value Date   CHOL 110 01/15/2022   HDL 56 01/15/2022   LDLCALC 37 01/15/2022   LDLDIRECT 40 01/15/2022   TRIG 84 01/15/2022   CHOLHDL 3.0 09/23/2021   No results for input(s): "LIPOA" in the last 8760 hours. No components found for: "NTPROBNP" Recent Labs    04/15/23 0954  PROBNP 2,326*   No results for input(s): "TSH" in the last 8760 hours.  External Labs: Collected: October 02, 2022 [facility] database. BUN 17, creatinine 1.46. eGFR 35. Hemoglobin 12.5 g/dL.   Physical Exam:    Today's Vitals   10/21/23 1316  BP: 120/82  Pulse: 80  Resp: 16  SpO2: 97%  Weight: 137 lb 9.6 oz (62.4 kg)  Height: 5\' 2"  (1.575 m)    Body mass  index is 25.17 kg/m. Wt Readings from Last 3 Encounters:  10/21/23 137 lb 9.6 oz (62.4 kg)  06/17/23 142 lb 9.6 oz (64.7 kg)  05/08/23 138 lb 12.8 oz (63 kg)    Physical Exam  Constitutional: No distress.  Age appropriate, hemodynamically stable.   Neck: No JVD present.  Cardiovascular: Regular rhythm, S1 normal, S2 normal, intact distal pulses and normal pulses. Tachycardia present. Exam reveals no gallop, no S3 and no S4.  Murmur heard. Systolic murmur is present with a grade of 3/6 at the lower left sternal border. Pacemaker site is healing.  Pulmonary/Chest: Effort normal and breath sounds normal. No stridor. She has no wheezes. She has no rales.  Abdominal: Soft. Bowel sounds are normal. She exhibits no distension. There is no abdominal tenderness.  Musculoskeletal:        General: No edema.     Cervical back: Neck supple.  Neurological: She is alert and oriented to person, place, and time. She has intact cranial nerves (2-12).  Skin: Skin is warm and moist.   Impression & Recommendation(s):  Impression:   ICD-10-CM   1. Persistent atrial fibrillation (HCC)  I48.19 EKG 12-Lead    2. Long term (current) use of anticoagulants  Z79.01     3. Hypercoagulable state due to persistent atrial fibrillation (HCC)  D68.69    I48.19     4. Chronic heart failure with preserved ejection fraction (HCC)  I50.32     5. Primary hypertension  I10     6. Cardiac pacemaker in situ  Z95.0     7. Cerebrovascular accident (CVA) due to occlusion of left middle cerebral artery (HCC)  I63.512     8. Cigarette smoker  F17.210       Recommendation(s):  Persistent atrial fibrillation EKG illustrates sinus rhythm. Rate control: Metoprolol. Rhythm control: N/A. Thromboembolic prophylaxis: Eliquis. History of direct-current cardioversion August 2023 200 J x 1-reverted back to A-fib shortly thereafter In the past patient refused to be on amiodarone due to side effect profile I had referred  her to A-fib clinic but at that time patient refused to be on antiarrhythmic medications.  To be considered for atrial fibrillation ablation. Currently she is in sinus rhythm but still notes that her A-fib burden is approximately 19% as per her iWatch (in the past it used to be closer  to 80% burden). The fact that she is able to maintain sinus rhythm we discussed considering antiarrhythmics to decrease her A-fib burden versus consideration of atrial fibrillation ablation given her HFpEF.  However she would like to hold off on advancing therapies at this time.  Long term (current) use of anticoagulants Currently on anticoagulation. Does not endorse evidence of bleeding. Recommended checking H&H at least every 6 months. Monitor for now  Chronic heart failure with preserved ejection fraction (HCC) Stage B, NYHA class II/III. Entresto in the past discontinued due to it being cost prohibitive. Jardiance not considered secondary to cost prohibitive Continue losartan 50 mg p.o. daily. Continue Toprol-XL 200 mg p.o. daily. Continue torsemide 10 mg p.o. daily as needed basis for weight gain  Atherosclerosis of both carotid arteries Improved  Duplex from 06/2023 noted near normal anatomy and no significant stenosis.  Will hold off additional follow up duplex; unless change in clinical status.  Reemphasized importance of complete smoking cessation. Reemphasized the importance of lipid management  Cardiac pacemaker in situ Indication: Tachybradycardia syndrome, symptomatic Biotronik dual-chamber pacemaker implant, 04/12/2022, Dr. Lalla Brothers  Primary hypertension Office blood pressures are well-controlled. Medication changes as discussed above.  Smoking Tobacco cessation counseling: Currently smoking 8 cigarettes/day She is informed of the dangers of tobacco abuse including stroke, cancer, and MI, as well as benefits of tobacco cessation. She is not willing to quit at this time. were  spent counseling patient cessation techniques. We discussed various methods to help quit smoking, including deciding on a date to quit, joining a support group, pharmacological agents- nicotine gum/patch/lozenges.  I will reassess her progress at the next follow-up visit  Orders Placed:  Orders Placed This Encounter  Procedures   EKG 12-Lead    Final Medication List:    No orders of the defined types were placed in this encounter.   There are no discontinued medications.   Current Outpatient Medications:    acetaminophen (TYLENOL) 500 MG tablet, Take 500 mg by mouth every 6 (six) hours as needed for mild pain., Disp: , Rfl:    apixaban (ELIQUIS) 2.5 MG TABS tablet, Take 1 tablet (2.5 mg total) by mouth 2 (two) times daily., Disp: 180 tablet, Rfl: 1   atorvastatin (LIPITOR) 20 MG tablet, TAKE 1 TABLET BY MOUTH AT BEDTIME, Disp: 90 tablet, Rfl: 3   Cholecalciferol (VITAMIN D3) 50 MCG (2000 UT) TABS, Take 2,000 Units by mouth daily., Disp: , Rfl:    losartan (COZAAR) 50 MG tablet, Take 1 tablet by mouth once daily, Disp: 90 tablet, Rfl: 3   meclizine (ANTIVERT) 12.5 MG tablet, As needed for dizziness, Disp: , Rfl:    metoprolol (TOPROL-XL) 200 MG 24 hr tablet, TAKE 1 TABLET BY MOUTH ONCE DAILY TAKE  WITH  OR  IMMEDIATELY  FOLLOWING  A  MEAL, Disp: 30 tablet, Rfl: 4   torsemide (DEMADEX) 10 MG tablet, Take 1 tablet (10 mg total) by mouth every morning., Disp: 30 tablet, Rfl: 0  Consent:      N/A  Disposition:   33-month follow-up sooner if needed.  Her questions and concerns were addressed to her satisfaction. She voices understanding of the recommendations provided during this encounter.    Signed, Tessa Lerner, DO, [facility]  [facility]  [address] [place], [place] [address] [phone] 10/21/2023 1:49 PM

## 2023-10-21 NOTE — Patient Instructions (Addendum)
 Medication Instructions:  Your physician recommends that you continue on your current medications as directed. Please refer to the Current Medication list given to you today.  *If you need a refill on your cardiac medications before your next appointment, please call your pharmacy*  Lab Work: None ordered today. If you have labs (blood work) drawn today and your tests are completely normal, you will receive your results only by: MyChart Message (if you have MyChart) OR A paper copy in the mail If you have any lab test that is abnormal or we need to change your treatment, we will call you to review the results.  Testing/Procedures: None ordered today.  Follow-Up: At [facility], you and your health needs are our priority.  As part of our continuing mission to provide you with exceptional heart care, we have created designated Provider Care Teams.  These Care Teams include your primary Cardiologist (physician) and Advanced Practice Providers (APPs -  Physician Assistants and Nurse Practitioners) who all work together to provide you with the care you need, when you need it.  We recommend signing up for the patient portal called "MyChart".  Sign up information is provided on this After Visit Summary.  MyChart is used to connect with patients for Virtual Visits (Telemedicine).  Patients are able to view lab/test results, encounter notes, upcoming appointments, etc.  Non-urgent messages can be sent to your provider as well.   To learn more about what you can do with MyChart, go to [email or web address].    Your next appointment:   6 month(s)  The format for your next appointment:   In Person  Provider:   Olinda Bertrand, DO {  Other Instructions If you are interested in being on an antiarrhythmic, call our office and let us  know and we can put in a referral for afib clinic.     1st Floor: - Lobby - Registration  - Pharmacy  - Lab - Cafe  2nd Floor: - PV Lab - Diagnostic  Testing (echo, CT, nuclear med)  3rd Floor: - Vacant  4th Floor: - TCTS (cardiothoracic surgery) - AFib Clinic - Structural Heart Clinic - Vascular Surgery  - Vascular Ultrasound  5th Floor: - HeartCare Cardiology (general and EP) - Clinical Pharmacy for coumadin, hypertension, lipid, weight-loss medications, and med management appointments    Valet parking services will be available as well.

## 2023-11-04 DIAGNOSIS — I5032 Chronic diastolic (congestive) heart failure: Secondary | ICD-10-CM | POA: Diagnosis not present

## 2023-11-04 DIAGNOSIS — N1832 Chronic kidney disease, stage 3b: Secondary | ICD-10-CM | POA: Diagnosis not present

## 2023-11-04 DIAGNOSIS — I272 Pulmonary hypertension, unspecified: Secondary | ICD-10-CM | POA: Diagnosis not present

## 2023-11-04 DIAGNOSIS — I10 Essential (primary) hypertension: Secondary | ICD-10-CM | POA: Diagnosis not present

## 2023-11-04 DIAGNOSIS — Z8673 Personal history of transient ischemic attack (TIA), and cerebral infarction without residual deficits: Secondary | ICD-10-CM | POA: Diagnosis not present

## 2023-11-10 ENCOUNTER — Other Ambulatory Visit: Payer: Self-pay | Admitting: Cardiology

## 2023-11-10 DIAGNOSIS — I5031 Acute diastolic (congestive) heart failure: Secondary | ICD-10-CM

## 2023-11-18 NOTE — Progress Notes (Signed)
 Remote pacemaker transmission.

## 2023-11-28 ENCOUNTER — Other Ambulatory Visit: Payer: Self-pay | Admitting: Cardiology

## 2023-11-28 DIAGNOSIS — I4891 Unspecified atrial fibrillation: Secondary | ICD-10-CM

## 2024-01-13 ENCOUNTER — Ambulatory Visit: Payer: Medicare Other

## 2024-01-13 DIAGNOSIS — Z961 Presence of intraocular lens: Secondary | ICD-10-CM | POA: Diagnosis not present

## 2024-01-13 DIAGNOSIS — I639 Cerebral infarction, unspecified: Secondary | ICD-10-CM | POA: Diagnosis not present

## 2024-01-13 DIAGNOSIS — H40013 Open angle with borderline findings, low risk, bilateral: Secondary | ICD-10-CM | POA: Diagnosis not present

## 2024-01-13 DIAGNOSIS — H04123 Dry eye syndrome of bilateral lacrimal glands: Secondary | ICD-10-CM | POA: Diagnosis not present

## 2024-01-13 DIAGNOSIS — H35363 Drusen (degenerative) of macula, bilateral: Secondary | ICD-10-CM | POA: Diagnosis not present

## 2024-01-13 DIAGNOSIS — I63512 Cerebral infarction due to unspecified occlusion or stenosis of left middle cerebral artery: Secondary | ICD-10-CM | POA: Diagnosis not present

## 2024-01-13 DIAGNOSIS — H43393 Other vitreous opacities, bilateral: Secondary | ICD-10-CM | POA: Diagnosis not present

## 2024-01-13 LAB — CUP PACEART REMOTE DEVICE CHECK
Date Time Interrogation Session: 20250707120050
Implantable Lead Connection Status: 753985
Implantable Lead Connection Status: 753985
Implantable Lead Implant Date: 20231005
Implantable Lead Implant Date: 20231005
Implantable Lead Location: 753859
Implantable Lead Location: 753860
Implantable Lead Model: 377
Implantable Lead Model: 377171
Implantable Lead Serial Number: 8000954610
Implantable Lead Serial Number: 8001083794
Implantable Pulse Generator Implant Date: 20231005
Pulse Gen Model: 407145
Pulse Gen Serial Number: 1000094218

## 2024-01-18 ENCOUNTER — Ambulatory Visit: Payer: Self-pay | Admitting: Cardiology

## 2024-04-13 ENCOUNTER — Ambulatory Visit: Payer: Medicare Other

## 2024-04-13 DIAGNOSIS — I4892 Unspecified atrial flutter: Secondary | ICD-10-CM | POA: Diagnosis not present

## 2024-04-13 DIAGNOSIS — I4891 Unspecified atrial fibrillation: Secondary | ICD-10-CM

## 2024-04-15 LAB — CUP PACEART REMOTE DEVICE CHECK
Battery Voltage: 85
Date Time Interrogation Session: 20251006112549
Implantable Lead Connection Status: 753985
Implantable Lead Connection Status: 753985
Implantable Lead Implant Date: 20231005
Implantable Lead Implant Date: 20231005
Implantable Lead Location: 753859
Implantable Lead Location: 753860
Implantable Lead Model: 377
Implantable Lead Model: 377171
Implantable Lead Serial Number: 8000954610
Implantable Lead Serial Number: 8001083794
Implantable Pulse Generator Implant Date: 20231005
Pulse Gen Model: 407145
Pulse Gen Serial Number: 1000094218

## 2024-04-16 NOTE — Progress Notes (Signed)
 Remote PPM Transmission

## 2024-04-20 ENCOUNTER — Ambulatory Visit: Payer: Self-pay | Admitting: Cardiology

## 2024-04-20 NOTE — Progress Notes (Signed)
 Remote PPM Transmission

## 2024-04-22 ENCOUNTER — Ambulatory Visit: Attending: Cardiovascular Disease | Admitting: Cardiology

## 2024-04-22 ENCOUNTER — Encounter: Payer: Self-pay | Admitting: Cardiology

## 2024-04-22 VITALS — BP 152/75 | HR 62 | Ht 62.0 in | Wt 140.4 lb

## 2024-04-22 DIAGNOSIS — Z950 Presence of cardiac pacemaker: Secondary | ICD-10-CM

## 2024-04-22 DIAGNOSIS — F17210 Nicotine dependence, cigarettes, uncomplicated: Secondary | ICD-10-CM | POA: Diagnosis not present

## 2024-04-22 DIAGNOSIS — I4819 Other persistent atrial fibrillation: Secondary | ICD-10-CM

## 2024-04-22 DIAGNOSIS — I10 Essential (primary) hypertension: Secondary | ICD-10-CM

## 2024-04-22 DIAGNOSIS — I4892 Unspecified atrial flutter: Secondary | ICD-10-CM

## 2024-04-22 DIAGNOSIS — I63512 Cerebral infarction due to unspecified occlusion or stenosis of left middle cerebral artery: Secondary | ICD-10-CM | POA: Diagnosis not present

## 2024-04-22 DIAGNOSIS — D6869 Other thrombophilia: Secondary | ICD-10-CM

## 2024-04-22 DIAGNOSIS — Z7901 Long term (current) use of anticoagulants: Secondary | ICD-10-CM

## 2024-04-22 DIAGNOSIS — I5032 Chronic diastolic (congestive) heart failure: Secondary | ICD-10-CM | POA: Diagnosis not present

## 2024-04-22 DIAGNOSIS — I4891 Unspecified atrial fibrillation: Secondary | ICD-10-CM | POA: Diagnosis not present

## 2024-04-22 LAB — HEMOGLOBIN AND HEMATOCRIT, BLOOD
Hematocrit: 37.2 % (ref 34.0–46.6)
Hemoglobin: 12.6 g/dL (ref 11.1–15.9)

## 2024-04-22 NOTE — Progress Notes (Signed)
 34-month follow-up Cardiology Office Note:  .   Date:  04/22/2024  ID:  Mary Ortiz, DOB June 07, 1941, MRN [id number] PCP:  Elliot Charm, MD  Former Cardiology Providers: Dr. Arne Fear  [facility] Providers Cardiologist:  Madonna Large, DO (established care July 2021) Electrophysiologist:  OLE ONEIDA HOLTS, MD  Click to update primary MD,subspecialty MD or APP then REFRESH:1}    Chief Complaint  Patient presents with   Follow-up    For heart failure and atrial fibrillation   History of Present Illness: .   Mary Ortiz is a 83 y.o. 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   In March 2023 she was diagnosed with left MCA stroke likely embolic in pattern.  She underwent loop recorder implant shortly thereafter and was diagnosed with atrial fibrillation.  She did undergo direct-current cardioversion but had ERAF. Given the underlying atrial fibrillation she eventually developed symptoms of HFpEF was started on medical therapy.  She was noted to have prolonged symptomatic pauses with dizziness and underwent pacemaker implant for tachybradycardia syndrome.  Persistent atrial fibrillation/flutter: During the last office visit because she remained in persistent A-fib.  Given her A-fib and heart failure recommended rhythm control strategy.  Patient not want to be on antiarrhythmic medications.  And therefore was referred to A-fib clinic to discuss further options.  She remains on rate control strategy as well as anticoagulation for thromboembolic prophylaxis.  Patient states that  I am satisfied with current therapy.  She does not  want to consider rhythm control at this time.  Patient understands that prolonged atrial fibrillation/flutter predisposes her to worsening cardiomyopathy and heart failure symptoms.  Based on her pacemaker report she has persistent atrial fibrillation/flutter.  But according to her iWatch patient states that she has been in A-fib and flutter for approximately 40%  HFpEF. Likely precipitated by her underlying atrial fibrillation.   Uptitration of medical therapy has been difficult due to some medications being cost prohibitive.   Entresto  was discontinued and Jardiance  was not started for the same reason.   Denies heart failure symptoms. Remains euvolemic and no hospitalizations for congestive heart failure. Discussed uptitration of GDMT and patient states that she would like to hold off.  Unfortunately, she continues to smoke at least 9 cigarettes/day  Review of Systems: .   Review of Systems  Cardiovascular:  Negative for chest pain, claudication, irregular heartbeat, leg swelling, near-syncope, orthopnea, palpitations, paroxysmal nocturnal dyspnea and syncope.  Respiratory:  Negative for shortness of breath.   Hematologic/Lymphatic: Negative for bleeding problem.    Studies Reviewed:   EKG: EKG Interpretation Date/Time:  Wednesday April 22 2024 11:20:31 EDT Ventricular Rate:  66 PR Interval:  170 QRS Duration:  134 QT Interval:  432 QTC Calculation: 452 R Axis:   36  Text Interpretation: Atrial flutter CONTROLLED VENTRICULAR RESPONSE Right bundle branch block Consider Septal infarct (cited on or before 22-Apr-2024) T wave abnormality, consider inferior ischemia When compared with ECG of 21-Oct-2023 13:19, PR interval has decreased Serial changes of Septal infarct Present Confirmed by Large Madonna [phone]) on 04/22/2024 11:25:49 AM  Echocardiogram: August 2021: LVEF 50-55%, grade 2 diastolic dysfunction, moderate AR, see report for additional details  09/22/2021:  1. Left  ventricular ejection fraction, by estimation, is  60 to 65%. The left ventricle has normal function. The left ventricle has no regional wall motion abnormalities. Left ventricular diastolic parameters are consistent with Grade II diastolic dysfunction (pseudonormalization).   2. Right ventricular systolic function is mildly reduced. The right ventricular size is normal. There is mildly elevated pulmonary artery systolic pressure. The estimated right ventricular systolic pressure is  42.1 mmHg.   3. Left atrial size was mildly dilated.   4. Right atrial size was mildly dilated.   5. The mitral valve is normal in structure. Mild to moderate mitral valve regurgitation. No evidence of mitral stenosis.   6. Tricuspid valve regurgitation is moderate.   7. The aortic valve is tricuspid. Aortic valve regurgitation is mild. No aortic stenosis is present.   8. The inferior vena cava is normal in size with <50% respiratory variability, suggesting right atrial pressure of 8 mmHg.    Stress Testing:  Lexiscan  Tetrofosmin stress test 06/04/2022: Lexiscan /modified Bruce nuclear stress test performed using 1-day protocol. Patient achieved 2.2 METS, reached 92% MPHR. After injection the patient developed no chest pain, and experienced dyspnea. Infusion was terminated due to fatigue/weakness. Normal myocardial perfusion. TID is reported abnormal at 1.57. In absence of clear perfusion defect, this is a nonspecific finding, probably related to small resting LV cavity.  Recommend clinical correlation.    Heart Catheterization: None   Carotid duplex: 02/09/2020: Bilateral ICA stenosis 50-69%.  See report for additional details   05/29/2022:  Duplex suggests stenosis in the right internal carotid artery (minimal).  Hemodynamically insignificant stenosis in the right external carotid artery <50%).  See report for additional details  06/2023: Right Carotid: The extracranial vessels were near-normal with only  minimal  wall thickening or plaque.   Left Carotid: The extracranial vessels were near-normal with only minimal wall thickening or plaque.   Vertebrals:  Bilateral vertebral arteries demonstrate antegrade flow.  Subclavians: Normal flow hemodynamics were seen in bilateral subclavian arteries.   Suggest follow up study PRN.    RADIOLOGY: N/A  Risk Assessment/Calculations:   Click Here to Calculate/Change CHADS2VASc Score  The patient's CHADS2-VASc score is 8, indicating a 10.8% annual risk of stroke.   CHF History: Yes HTN History: Yes Diabetes History: No Stroke History: Yes Vascular Disease History: Yes  Labs:       Latest Ref Rng & Units 10/02/2022   10:05 AM 03/30/2022   12:48 PM 03/09/2022   10:10 AM  CBC  WBC 3.4 - 10.8 x10E3/uL  8.6    Hemoglobin 11.1 - 15.9 g/dL 87.4  87.2  88.2   Hematocrit 34.0 - 46.6 % 37.8  38.9  33.5   Platelets 150 - 450 x10E3/uL  211         Latest Ref Rng & Units 04/24/2023   10:27 AM 04/15/2023    9:54 AM 10/02/2022   10:03 AM  BMP  Glucose 70 - 99 mg/dL 98  896  93   BUN 8 - 27 mg/dL 19  17  17    Creatinine 0.57 - 1.00 mg/dL 8.51  8.59  8.53   BUN/Creat Ratio 12 - 28 13  12  12    Sodium 134 - 144 mmol/L 140  141  141   Potassium 3.5 - 5.2 mmol/L 5.0  4.3  4.7   Chloride 96 - 106 mmol/L 103  103  104   CO2 20 - 29 mmol/L 22  25  22    Calcium  8.7 - 10.3 mg/dL 9.6  9.4  9.3  Latest Ref Rng & Units 04/24/2023   10:27 AM 04/15/2023    9:54 AM 10/02/2022   10:03 AM  CMP  Glucose 70 - 99 mg/dL 98  896  93   BUN 8 - 27 mg/dL 19  17  17    Creatinine 0.57 - 1.00 mg/dL 8.51  8.59  8.53   Sodium 134 - 144 mmol/L 140  141  141   Potassium 3.5 - 5.2 mmol/L 5.0  4.3  4.7   Chloride 96 - 106 mmol/L 103  103  104   CO2 20 - 29 mmol/L 22  25  22    Calcium  8.7 - 10.3 mg/dL 9.6  9.4  9.3     Lab Results  Component Value Date   CHOL 110 01/15/2022   HDL 56 01/15/2022   LDLCALC 37 01/15/2022   LDLDIRECT 40 01/15/2022   TRIG 84  01/15/2022   CHOLHDL 3.0 09/23/2021   No results for input(s): LIPOA in the last 8760 hours. No components found for: NTPROBNP No results for input(s): PROBNP in the last 8760 hours.  No results for input(s): TSH in the last 8760 hours.  External Labs: Collected: October 02, 2022 [facility] database. BUN 17, creatinine 1.46. eGFR 35. Hemoglobin 12.5 g/dL.   Physical Exam:    Today's Vitals   04/22/24 1056  BP: (!) 152/75  Pulse: 62  SpO2: 96%  Weight: 140 lb 6.4 oz (63.7 kg)  Height: 5' 2 (1.575 m)    Body mass index is 25.68 kg/m. Wt Readings from Last 3 Encounters:  04/22/24 140 lb 6.4 oz (63.7 kg)  10/21/23 137 lb 9.6 oz (62.4 kg)  06/17/23 142 lb 9.6 oz (64.7 kg)    Physical Exam  Constitutional: No distress.  Age appropriate, hemodynamically stable.   Neck: No JVD present.  Cardiovascular: Normal rate, S1 normal, S2 normal, intact distal pulses and normal pulses. A regularly irregular rhythm present. Exam reveals no gallop, no S3 and no S4.  Murmur heard. Systolic murmur is present with a grade of 3/6 at the lower left sternal border. Pacemaker site is clean dry and intact.  Pulmonary/Chest: Effort normal and breath sounds normal. No stridor. She has no wheezes. She has no rales.  Abdominal: Soft. Bowel sounds are normal. She exhibits no distension. There is no abdominal tenderness.  Musculoskeletal:        General: No edema.     Cervical back: Neck supple.  Neurological: She is alert and oriented to person, place, and time. She has intact cranial nerves (2-12).  Skin: Skin is warm and moist.   Impression & Recommendation(s):  Impression:   ICD-10-CM   1. Atrial fibrillation and flutter (HCC)  I48.91 EKG 12-Lead   I48.92 Basic metabolic panel with GFR    Hemoglobin and hematocrit, blood    CANCELED: TSH    2. Long term (current) use of anticoagulants  Z79.01 Basic metabolic panel with GFR    Hemoglobin and hematocrit, blood    3. Hypercoagulable state  due to persistent atrial fibrillation (HCC)  D68.69    I48.19     4. Chronic heart failure with preserved ejection fraction (HCC)  I50.32     5. Primary hypertension  I10     6. Cardiac pacemaker in situ  Z95.0     7. Cerebrovascular accident (CVA) due to occlusion of left middle cerebral artery (HCC)  I63.512     8. Cigarette smoker  F17.210        Recommendation(s):  Persistent atrial fibrillation/flutter EKG illustrates rate controlled atrial flutter Rate control: Metoprolol . Rhythm control: N/A. Thromboembolic prophylaxis: Eliquis . History of direct-current cardioversion August 2023 200 J x 1-reverted back to A-fib shortly thereafter In the past patient refused to be on amiodarone  due to side effect profile I had referred her to A-fib clinic to discuss rhythm management-patient would like to hold off on antiarrhythmics or catheter directed ablation.   Patient states that she is satisfied with her current medical regimen.   Her shortness of breath is well-controlled, she has less brain fog, and wants to avoid additional medical therapy  Patient understands a prolonged history of persistent atrial fibrillation/flutter predisposes her to cardiomyopathy and heart failure.    Long term (current) use of anticoagulants Currently on anticoagulation. Does not endorse evidence of bleeding. Check H&H and BMP  Risks, benefits, and alternatives to anticoagulation discussed Monitor for now  Chronic heart failure with preserved ejection fraction (HCC) Stage B, NYHA class II/III. Entresto  in the past discontinued due to it being cost prohibitive. Jardiance  not considered secondary to cost prohibitive Continue losartan  50 mg p.o. daily. Continue Toprol -XL 200 mg p.o. daily. Continue torsemide  10 mg p.o. daily. I recommended starting spironolactone 25 mg p.o. daily for better blood pressure management and uptitration of GDMT.  Patient refuses.  States that her home blood pressures are  130 mmHg  Atherosclerosis of both carotid arteries Improved  Duplex from 06/2023 noted near normal anatomy and no significant stenosis.  Will hold off additional follow up duplex; unless change in clinical status.  Reemphasized importance of complete smoking cessation. Reemphasized the importance of lipid management  Cardiac pacemaker in situ Indication: Tachybradycardia syndrome, symptomatic Biotronik dual-chamber pacemaker implant, 04/12/2022, Dr. Cindie Most recent pacemaker interrogation report reviewed.  Primary hypertension Office blood pressures are are not well-controlled. States home blood pressures are better controlled. Discussed uptitration of GDMT/antihypertensive medications; however, she would like to hold off.  Smoking Tobacco cessation counseling: Currently smoking 9 cigarettes/day She is informed of the dangers of tobacco abuse including stroke, cancer, and MI, as well as benefits of tobacco cessation. She is not willing to quit at this time. 5 mins were spent counseling patient cessation techniques. We discussed various methods to help quit smoking, including deciding on a date to quit, joining a support group, pharmacological agents- nicotine gum/patch/lozenges.  I will reassess her progress at the next follow-up visit  Orders Placed:  Orders Placed This Encounter  Procedures   Basic metabolic panel with GFR   Hemoglobin and hematocrit, blood   EKG 12-Lead    Final Medication List:    No orders of the defined types were placed in this encounter.   There are no discontinued medications.   Current Outpatient Medications:    acetaminophen  (TYLENOL ) 500 MG tablet, Take 500 mg by mouth every 6 (six) hours as needed for mild pain., Disp: , Rfl:    apixaban  (ELIQUIS ) 2.5 MG TABS tablet, Take 1 tablet (2.5 mg total) by mouth 2 (two) times daily., Disp: 180 tablet, Rfl: 1   atorvastatin  (LIPITOR) 20 MG tablet, TAKE 1 TABLET BY MOUTH AT BEDTIME, Disp: 90 tablet,  Rfl: 3   Cholecalciferol (VITAMIN D3) 50 MCG (2000 UT) TABS, Take 2,000 Units by mouth daily., Disp: , Rfl:    losartan  (COZAAR ) 50 MG tablet, Take 1 tablet by mouth once daily, Disp: 90 tablet, Rfl: 3   meclizine (ANTIVERT) 12.5 MG tablet, As needed for dizziness, Disp: , Rfl:    metoprolol  (TOPROL -XL) 200 MG  24 hr tablet, TAKE 1 TABLET BY MOUTH ONCE DAILY TAKE  WITH  OR  IMMEDIATELY  FOLLOWING  A  MEAL, Disp: 90 tablet, Rfl: 3   torsemide  (DEMADEX ) 10 MG tablet, TAKE 1 TABLET BY MOUTH ONCE DAILY IN THE MORNING, Disp: 90 tablet, Rfl: 3  Consent:      N/A  Disposition:   27-month follow-up sooner if needed.  Her questions and concerns were addressed to her satisfaction. She voices understanding of the recommendations provided during this encounter.    Signed, Madonna Michele HAS, [facility]  A Division of [facility] [address], [place], [place] [address]   04/22/2024 11:52 AM

## 2024-04-22 NOTE — Patient Instructions (Addendum)
 Medication Instructions:  Your physician recommends that you continue on your current medications as directed. Please refer to the Current Medication list given to you today.  *If you need a refill on your cardiac medications before your next appointment, please call your pharmacy*  Lab Work: TSH, BMP - Today  You may go to any [facility] Location for your lab work:  [facility] - [address] ([facility]) - [phone] [address]  [facility] - [address]   [facility]  - [address]   [place] - [address] - [address] [employer]  - [address] - [address] ([facility]   If you have labs (blood work) drawn today and your tests are completely normal, you will receive your results only by: [employer] (if you have MyChart)  If you have any lab test that is abnormal or we need to change your treatment, we will call you or send a MyChart message to review the results.  Testing/Procedures: None ordered.  Follow-Up: At [facility], you and your health needs are our priority.  As part of our continuing mission to provide you with exceptional heart care, we have created designated Provider Care Teams.  These Care Teams include your primary Cardiologist (physician) and Advanced Practice Providers (APPs -  Physician Assistants and Nurse Practitioners) who all work together to provide you with the care you need, when you need it.    Your next appointment:   6 months  The format for your next appointment:   In Person  Provider:   Madonna Large, DO

## 2024-04-23 LAB — BASIC METABOLIC PANEL WITH GFR
BUN/Creatinine Ratio: 14 (ref 12–28)
BUN: 19 mg/dL (ref 8–27)
CO2: 24 mmol/L (ref 20–29)
Calcium: 8.9 mg/dL (ref 8.7–10.3)
Chloride: 101 mmol/L (ref 96–106)
Creatinine, Ser: 1.38 mg/dL — ABNORMAL HIGH (ref 0.57–1.00)
Glucose: 110 mg/dL — ABNORMAL HIGH (ref 70–99)
Potassium: 4.5 mmol/L (ref 3.5–5.2)
Sodium: 140 mmol/L (ref 134–144)
eGFR: 38 mL/min/1.73 — ABNORMAL LOW (ref >59)

## 2024-05-01 ENCOUNTER — Ambulatory Visit: Payer: Self-pay | Admitting: Cardiology

## 2024-05-04 ENCOUNTER — Other Ambulatory Visit: Payer: Self-pay | Admitting: *Deleted

## 2024-05-04 DIAGNOSIS — I4819 Other persistent atrial fibrillation: Secondary | ICD-10-CM

## 2024-05-04 MED ORDER — APIXABAN 5 MG PO TABS: 5.0000 mg | ORAL_TABLET | Freq: Two times a day (BID) | ORAL | 1 refills | Status: AC

## 2024-05-04 NOTE — Telephone Encounter (Signed)
 Eliquis  5mg  refill. Patient is 83 years old, weight-63.7kg, Crea-1.38 on 04/22/24, Diagnosis-Afib/flutter, and last seen by Dr Michele on 04/22/24. Dose is appropriate based on dosing criteria. Will send in refill to requested pharmacy and removed eliquis  2.5mg  off list.     Michele Richardson, DO to [facility] Triage    05/01/24  8:43 PM Result Note Renal function has improved.  Based on her weight, age, and renal function.  Please send in a new script for Eliquis  5 mg po bid, she will stop 2.5mg  tabs for now.    Sunit [place], DO, [facility]   Spoke with patient and advised will send in the eliquis  5mg  twice a day. She states to send to [place] at [facility].

## 2024-07-13 ENCOUNTER — Ambulatory Visit: Payer: Medicare Other

## 2024-07-13 DIAGNOSIS — I4819 Other persistent atrial fibrillation: Secondary | ICD-10-CM | POA: Diagnosis not present

## 2024-07-14 ENCOUNTER — Ambulatory Visit: Payer: Self-pay | Admitting: Cardiology

## 2024-07-14 LAB — CUP PACEART REMOTE DEVICE CHECK
Battery Voltage: 80
Date Time Interrogation Session: 20260105092006
Implantable Lead Connection Status: 753985
Implantable Lead Connection Status: 753985
Implantable Lead Implant Date: 20231005
Implantable Lead Implant Date: 20231005
Implantable Lead Location: 753859
Implantable Lead Location: 753860
Implantable Lead Model: 377
Implantable Lead Model: 377171
Implantable Lead Serial Number: 8000954610
Implantable Lead Serial Number: 8001083794
Implantable Pulse Generator Implant Date: 20231005
Pulse Gen Model: 407145
Pulse Gen Serial Number: 1000094218

## 2024-07-16 NOTE — Progress Notes (Signed)
 Remote PPM Transmission

## 2024-10-22 ENCOUNTER — Ambulatory Visit: Admitting: Cardiology
